# Patient Record
Sex: Female | Born: 1940
Health system: Southern US, Community
[De-identification: ages and names within clinical notes are randomized; demographics above are authoritative.]

## PROBLEM LIST (undated history)

## (undated) DIAGNOSIS — F419 Anxiety disorder, unspecified: Secondary | ICD-10-CM

## (undated) DIAGNOSIS — E039 Hypothyroidism, unspecified: Secondary | ICD-10-CM

## (undated) DIAGNOSIS — G43909 Migraine, unspecified, not intractable, without status migrainosus: Secondary | ICD-10-CM

## (undated) DIAGNOSIS — Z87442 Personal history of urinary calculi: Secondary | ICD-10-CM

## (undated) DIAGNOSIS — R112 Nausea with vomiting, unspecified: Secondary | ICD-10-CM

## (undated) DIAGNOSIS — M1712 Unilateral primary osteoarthritis, left knee: Secondary | ICD-10-CM

## (undated) DIAGNOSIS — R609 Edema, unspecified: Secondary | ICD-10-CM

## (undated) DIAGNOSIS — Z9889 Other specified postprocedural states: Secondary | ICD-10-CM

## (undated) DIAGNOSIS — E785 Hyperlipidemia, unspecified: Secondary | ICD-10-CM

## (undated) DIAGNOSIS — M8580 Other specified disorders of bone density and structure, unspecified site: Secondary | ICD-10-CM

## (undated) DIAGNOSIS — C801 Malignant (primary) neoplasm, unspecified: Secondary | ICD-10-CM

## (undated) DIAGNOSIS — D649 Anemia, unspecified: Secondary | ICD-10-CM

## (undated) DIAGNOSIS — I2699 Other pulmonary embolism without acute cor pulmonale: Secondary | ICD-10-CM

## (undated) DIAGNOSIS — I890 Lymphedema, not elsewhere classified: Secondary | ICD-10-CM

## (undated) DIAGNOSIS — C439 Malignant melanoma of skin, unspecified: Secondary | ICD-10-CM

## (undated) DIAGNOSIS — M1990 Unspecified osteoarthritis, unspecified site: Secondary | ICD-10-CM

## (undated) DIAGNOSIS — I10 Essential (primary) hypertension: Secondary | ICD-10-CM

## (undated) DIAGNOSIS — R011 Cardiac murmur, unspecified: Secondary | ICD-10-CM

## (undated) DIAGNOSIS — K760 Fatty (change of) liver, not elsewhere classified: Secondary | ICD-10-CM

## (undated) DIAGNOSIS — I82409 Acute embolism and thrombosis of unspecified deep veins of unspecified lower extremity: Secondary | ICD-10-CM

## (undated) HISTORY — DX: Essential (primary) hypertension: I10

## (undated) HISTORY — PX: APPENDECTOMY: SHX54

## (undated) HISTORY — DX: Migraine, unspecified, not intractable, without status migrainosus: G43.909

## (undated) HISTORY — DX: Hypothyroidism, unspecified: E03.9

## (undated) HISTORY — DX: Other specified disorders of bone density and structure, unspecified site: M85.80

## (undated) HISTORY — PX: UPPER GI ENDOSCOPY: SHX6162

## (undated) HISTORY — PX: COLONOSCOPY: SHX174

## (undated) HISTORY — DX: Malignant (primary) neoplasm, unspecified: C80.1

## (undated) HISTORY — PX: ABDOMINAL HYSTERECTOMY: SHX81

## (undated) HISTORY — DX: Unilateral primary osteoarthritis, left knee: M17.12

## (undated) HISTORY — PX: OVARIAN CYST SURGERY: SHX726

## (undated) HISTORY — DX: Edema, unspecified: R60.9

## (undated) HISTORY — PX: REDUCTION MAMMAPLASTY: SUR839

---

## 1998-02-26 ENCOUNTER — Emergency Department (HOSPITAL_COMMUNITY): Admission: EM | Admit: 1998-02-26 | Discharge: 1998-02-26 | Payer: Self-pay | Admitting: Emergency Medicine

## 1999-12-19 ENCOUNTER — Other Ambulatory Visit: Admission: RE | Admit: 1999-12-19 | Discharge: 1999-12-19 | Payer: Self-pay | Admitting: Internal Medicine

## 2001-03-01 ENCOUNTER — Other Ambulatory Visit: Admission: RE | Admit: 2001-03-01 | Discharge: 2001-03-01 | Payer: Self-pay | Admitting: Internal Medicine

## 2001-03-05 ENCOUNTER — Encounter: Payer: Self-pay | Admitting: Internal Medicine

## 2001-03-05 ENCOUNTER — Encounter: Admission: RE | Admit: 2001-03-05 | Discharge: 2001-03-05 | Payer: Self-pay | Admitting: Internal Medicine

## 2001-09-19 HISTORY — PX: KNEE ARTHROSCOPY: SUR90

## 2001-10-10 ENCOUNTER — Encounter: Payer: Self-pay | Admitting: Internal Medicine

## 2001-10-10 ENCOUNTER — Observation Stay (HOSPITAL_COMMUNITY): Admission: EM | Admit: 2001-10-10 | Discharge: 2001-10-10 | Payer: Self-pay | Admitting: Emergency Medicine

## 2001-10-10 ENCOUNTER — Encounter: Payer: Self-pay | Admitting: Emergency Medicine

## 2001-10-14 ENCOUNTER — Ambulatory Visit (HOSPITAL_COMMUNITY): Admission: RE | Admit: 2001-10-14 | Discharge: 2001-10-14 | Payer: Self-pay | Admitting: Orthopedic Surgery

## 2001-12-22 ENCOUNTER — Encounter: Payer: Self-pay | Admitting: Gastroenterology

## 2001-12-22 ENCOUNTER — Ambulatory Visit (HOSPITAL_COMMUNITY): Admission: RE | Admit: 2001-12-22 | Discharge: 2001-12-22 | Payer: Self-pay | Admitting: Gastroenterology

## 2002-01-07 ENCOUNTER — Ambulatory Visit (HOSPITAL_COMMUNITY): Admission: RE | Admit: 2002-01-07 | Discharge: 2002-01-07 | Payer: Self-pay | Admitting: Gastroenterology

## 2002-02-14 ENCOUNTER — Encounter: Payer: Self-pay | Admitting: Surgery

## 2002-02-14 ENCOUNTER — Encounter (INDEPENDENT_AMBULATORY_CARE_PROVIDER_SITE_OTHER): Payer: Self-pay

## 2002-02-14 ENCOUNTER — Observation Stay (HOSPITAL_COMMUNITY): Admission: RE | Admit: 2002-02-14 | Discharge: 2002-02-15 | Payer: Self-pay | Admitting: Surgery

## 2002-02-14 HISTORY — PX: CHOLECYSTECTOMY: SHX55

## 2002-03-03 ENCOUNTER — Other Ambulatory Visit: Admission: RE | Admit: 2002-03-03 | Discharge: 2002-03-03 | Payer: Self-pay | Admitting: Internal Medicine

## 2003-03-14 ENCOUNTER — Other Ambulatory Visit: Admission: RE | Admit: 2003-03-14 | Discharge: 2003-03-14 | Payer: Self-pay | Admitting: Internal Medicine

## 2003-03-21 ENCOUNTER — Encounter: Admission: RE | Admit: 2003-03-21 | Discharge: 2003-03-21 | Payer: Self-pay | Admitting: Internal Medicine

## 2003-04-22 HISTORY — PX: ANKLE FUSION: SHX881

## 2004-05-06 ENCOUNTER — Other Ambulatory Visit: Admission: RE | Admit: 2004-05-06 | Discharge: 2004-05-06 | Payer: Self-pay | Admitting: Internal Medicine

## 2004-05-23 ENCOUNTER — Encounter: Admission: RE | Admit: 2004-05-23 | Discharge: 2004-05-23 | Payer: Self-pay | Admitting: Internal Medicine

## 2006-04-21 HISTORY — PX: GANGLION CYST EXCISION: SHX1691

## 2007-04-22 HISTORY — PX: RECTOCELE REPAIR: SHX761

## 2007-08-11 ENCOUNTER — Ambulatory Visit: Payer: Self-pay

## 2008-04-21 DIAGNOSIS — I2699 Other pulmonary embolism without acute cor pulmonale: Secondary | ICD-10-CM

## 2008-04-21 HISTORY — PX: JOINT REPLACEMENT: SHX530

## 2008-04-21 HISTORY — DX: Other pulmonary embolism without acute cor pulmonale: I26.99

## 2008-05-02 ENCOUNTER — Ambulatory Visit: Payer: Self-pay | Admitting: Internal Medicine

## 2008-10-09 ENCOUNTER — Ambulatory Visit: Payer: Self-pay | Admitting: Internal Medicine

## 2008-10-24 ENCOUNTER — Ambulatory Visit: Payer: Self-pay | Admitting: Internal Medicine

## 2008-10-27 ENCOUNTER — Ambulatory Visit: Payer: Self-pay | Admitting: Internal Medicine

## 2008-11-20 ENCOUNTER — Ambulatory Visit: Payer: Self-pay | Admitting: Internal Medicine

## 2008-11-21 ENCOUNTER — Ambulatory Visit: Payer: Self-pay | Admitting: Internal Medicine

## 2008-11-22 ENCOUNTER — Ambulatory Visit: Payer: Self-pay | Admitting: Internal Medicine

## 2008-11-23 ENCOUNTER — Ambulatory Visit: Payer: Self-pay | Admitting: Internal Medicine

## 2008-11-24 ENCOUNTER — Ambulatory Visit: Payer: Self-pay | Admitting: Internal Medicine

## 2008-11-30 ENCOUNTER — Ambulatory Visit: Payer: Self-pay | Admitting: Internal Medicine

## 2008-12-04 ENCOUNTER — Ambulatory Visit: Payer: Self-pay | Admitting: Internal Medicine

## 2008-12-08 ENCOUNTER — Ambulatory Visit: Payer: Self-pay | Admitting: Internal Medicine

## 2008-12-14 ENCOUNTER — Ambulatory Visit: Payer: Self-pay | Admitting: Internal Medicine

## 2008-12-28 ENCOUNTER — Ambulatory Visit: Payer: Self-pay | Admitting: Internal Medicine

## 2009-01-25 ENCOUNTER — Ambulatory Visit: Payer: Self-pay | Admitting: Internal Medicine

## 2009-02-08 ENCOUNTER — Ambulatory Visit: Payer: Self-pay | Admitting: Internal Medicine

## 2009-03-12 ENCOUNTER — Ambulatory Visit: Payer: Self-pay | Admitting: Internal Medicine

## 2009-04-03 ENCOUNTER — Ambulatory Visit: Payer: Self-pay | Admitting: Internal Medicine

## 2009-05-08 ENCOUNTER — Ambulatory Visit: Payer: Self-pay | Admitting: Internal Medicine

## 2009-06-07 ENCOUNTER — Ambulatory Visit: Payer: Self-pay | Admitting: Internal Medicine

## 2010-01-15 ENCOUNTER — Ambulatory Visit: Payer: Self-pay | Admitting: Internal Medicine

## 2010-07-02 ENCOUNTER — Other Ambulatory Visit: Payer: Self-pay | Admitting: Internal Medicine

## 2010-07-04 ENCOUNTER — Encounter: Payer: Self-pay | Admitting: Internal Medicine

## 2010-07-12 ENCOUNTER — Encounter: Payer: Self-pay | Admitting: Internal Medicine

## 2010-07-22 ENCOUNTER — Encounter (INDEPENDENT_AMBULATORY_CARE_PROVIDER_SITE_OTHER): Payer: Medicare Other | Admitting: Internal Medicine

## 2010-07-22 DIAGNOSIS — E039 Hypothyroidism, unspecified: Secondary | ICD-10-CM

## 2010-07-22 DIAGNOSIS — I10 Essential (primary) hypertension: Secondary | ICD-10-CM

## 2010-07-22 DIAGNOSIS — R6889 Other general symptoms and signs: Secondary | ICD-10-CM

## 2010-08-19 ENCOUNTER — Ambulatory Visit: Payer: Medicare Other | Admitting: Internal Medicine

## 2010-08-27 ENCOUNTER — Ambulatory Visit (INDEPENDENT_AMBULATORY_CARE_PROVIDER_SITE_OTHER): Payer: Medicare Other | Admitting: Internal Medicine

## 2010-08-27 ENCOUNTER — Encounter: Payer: Self-pay | Admitting: Internal Medicine

## 2010-08-27 DIAGNOSIS — E039 Hypothyroidism, unspecified: Secondary | ICD-10-CM | POA: Insufficient documentation

## 2010-08-27 DIAGNOSIS — R739 Hyperglycemia, unspecified: Secondary | ICD-10-CM

## 2010-08-27 DIAGNOSIS — Z131 Encounter for screening for diabetes mellitus: Secondary | ICD-10-CM

## 2010-08-27 DIAGNOSIS — I10 Essential (primary) hypertension: Secondary | ICD-10-CM | POA: Insufficient documentation

## 2010-08-27 DIAGNOSIS — E669 Obesity, unspecified: Secondary | ICD-10-CM | POA: Insufficient documentation

## 2010-08-27 DIAGNOSIS — R6889 Other general symptoms and signs: Secondary | ICD-10-CM

## 2010-08-27 DIAGNOSIS — T880XXA Infection following immunization, initial encounter: Secondary | ICD-10-CM

## 2010-08-27 DIAGNOSIS — G43909 Migraine, unspecified, not intractable, without status migrainosus: Secondary | ICD-10-CM | POA: Insufficient documentation

## 2010-08-27 DIAGNOSIS — IMO0002 Reserved for concepts with insufficient information to code with codable children: Secondary | ICD-10-CM

## 2010-08-27 DIAGNOSIS — R899 Unspecified abnormal finding in specimens from other organs, systems and tissues: Secondary | ICD-10-CM

## 2010-08-27 DIAGNOSIS — R7309 Other abnormal glucose: Secondary | ICD-10-CM

## 2010-08-27 DIAGNOSIS — Z23 Encounter for immunization: Secondary | ICD-10-CM

## 2010-08-27 LAB — HEPATIC FUNCTION PANEL
Albumin: 4.5 g/dL (ref 3.5–5.2)
Total Protein: 7.2 g/dL (ref 6.0–8.3)

## 2010-08-27 NOTE — Progress Notes (Signed)
  Subjective:    Patient ID: Cassidy [name], female    DOB: July 27, 1940, 70 y.o.   MRN: [id number]  HPI 70 yo W female for followup of hypertension and elevated liver enzymes. Liver enzymes elevated at time of PE on July 22, 2010. No hx of recent viral illness. Is obese, does not consume alcohol. Generally feels well. Once in a while takes Vicodin for knee pain.    Review of Systems     Objective:   Physical Exam  Abdominal: Soft. Bowel sounds are normal. She exhibits no distension and no mass. There is no tenderness. There is no rebound and no guarding.   No hepatosplenomegaly.       Assessment & Plan:  Elevated liver functions- etiology unclear-?fatty liver; hypertension well controlled. Evaluate labs drawn today and advise pt of future plans and follow up. Tdap vaccine given today.

## 2010-08-28 LAB — HEMOGLOBIN A1C: Mean Plasma Glucose: 117 mg/dL — ABNORMAL HIGH (ref <117)

## 2010-09-06 NOTE — Op Note (Signed)
NAME:  Cassidy [name], Cassidy [name]                       ACCOUNT NO.:  [id number]   MEDICAL RECORD NO.:  [id number]                   PATIENT TYPE:  AMB   LOCATION:  ENDO                                 FACILITY:  [facility]   PHYSICIAN:  Charna Elizabeth, M.D.                   DATE OF BIRTH:  October 05, 1940   DATE OF PROCEDURE:  01/07/2002  DATE OF DISCHARGE:  01/07/2002                                 OPERATIVE REPORT   PROCEDURE:  Screening colonoscopy.   ENDOSCOPIST:  Charna Elizabeth, M.D.   INSTRUMENT USED:  Olympus video colonoscope.   INDICATION FOR PROCEDURE:  A 70 year old white female undergoing screening  colonoscopy.  Rule out colonic polyps, masses, hemorrhoids, etc.   PREPROCEDURE PREPARATION:  Informed consent was procured from the patient.  The patient was fasted for eight hours prior to the procedure and prepped  with a bottle of magnesium citrate and a gallon of NuLytely the night prior  to the procedure.   PREPROCEDURE PHYSICAL:  VITAL SIGNS:  The patient had stable vital signs.  NECK:  Supple.  CHEST:  Clear to auscultation.  S1, S2 regular.  ABDOMEN:  Soft with normal bowel sounds.   DESCRIPTION OF PROCEDURE:  The patient was placed in the left lateral  decubitus position and sedated with an additional 30 mg of Demerol and 2.5  mg of Versed intravenously.  Once the patient was adequately sedate and  maintained on low-flow oxygen and continuous cardiac monitoring, the Olympus  video colonoscope was advanced from the rectum to the cecum with slight  difficulty.  There was some residual in the colon.  The patient's position  was changed from the left lateral to the supine position.  The scope was  advanced to the cecum.  The appendiceal orifice and the ileocecal valve were  visualized and photographed.  No masses, polyps, erosions, ulcerations, or  diverticula were seen.  A small internal hemorrhoid was seen on retroflexion  in the rectum.  These were not bleeding at the time of  examination.   IMPRESSION:  1. Small, nonbleeding internal hemorrhoid.  2. Some residual stool in the colon.  No masses, polyps, or diverticulosis     noted.   RECOMMENDATIONS:  1. A high-fiber diet has been recommended for the patient.  2.     Repeat colorectal cancer screening is recommended in the next five years     unless the patient develops any abnormal symptoms in the interim.  3. Outpatient follow-up within the next seven to 10 days.                                               Charna Elizabeth, M.D.    JM/MEDQ  D:  01/07/2002  T:  01/10/2002  Job:  [id number]  cc:   Luanna Cole. Lenord Fellers, M.D.

## 2010-09-06 NOTE — H&P (Signed)
[facility]  Patient:    Cassidy [name], Cassidy [name] Visit Number: [id number] MRN: [id number]          Service Type: MED Location: 3W [phone] Attending Physician:  Sharlet Salina Dictated by:   Quita Skye Artis Flock, M.D. Admit Date:  10/09/2001 Discharge Date: 10/10/2001                           History and Physical  CHIEF COMPLAINT:  Nausea and vomiting.  HISTORY:  This 70 year old white female is a regular patient of Dr. Eden Emms Baxley and was in her usual state of good health yesterday until approximately 9:15 p.m. last night when she was at a high school reunion and had sudden onset of pain in her right scapular area with associated diaphoresis, nausea, vomiting, and diarrhea.  There was no blood seen in the vomiting or the diarrhea.  She had eaten hot dog and baked beans prior to the onset of symptoms, but her sisters who were also there had eaten the same thing and did not have any problems.  Prior in the day they had been to [facility] and had baked fish at another high school reunion event.  The patient vomited approximately 10 times thereafter and finally, in the early a.m., came to [facility] emergency room for evaluation.  The patient reports no history of GERD symptoms, has never been told of gallbladder trouble, did not have cramps or fever with the symptoms.  ALLERGIES:  None.  MEDICATIONS: 1. Synthroid 100 mcg a day. 2. Atenolol 25 mg a day. 3. Hydrochlorothiazide 25 mg a day.  SOCIAL HISTORY:  The patient is married.  She is a retired [employer] scientist. Does not smoke, does not drink.  FAMILY HISTORY:  The patient has two children - one son, one daughter - and three grandchildren.  She has two sisters, and one brother living - had an MI. One deceased brother died in an accident.  Both parents are deceased.  Father died at age 107 of an MI.  Mother died at age 70 of natural causes.  PAST MEDICAL HISTORY:  Status post hysterectomy,  status post appendectomy and associated ruptured ovarian cyst.  Chest pain bilateral breast reduction. Status post face lift.  REVIEW OF SYSTEMS:  The patient has hypertension for approximately a year-and-a-half.  She has mild osteoarthritis; she has ligament damage in her right knee at the current time and plans to have a repair on Thursday of this week by Dr. Fayrene Fearing [place].  The patient is menopausal since age 53.  PHYSICAL EXAMINATION:  GENERAL:  The patient is alert and cooperative in no acute distress.  Her abdominal symptoms are improved at the current time.  ADMISSION VITAL SIGNS:  Temperature 97.6, pulse 66 and regular, respirations 16, blood pressure 147/76, height 5 feet 8 inches, weight 215 pounds, O2 saturation on room air 94%.  HEENT:  Moist mucous membranes; otherwise normal.  NECK:  Supple.  Carotids 2+ bilaterally without bruits.  No jugular venous distention, no thyromegaly, no cervical adenopathy.  HEART:  Reveals regular rate and rhythm without murmurs or gallops.  Lungs are clear throughout to auscultation.  BREASTS:  Show bilateral reduction scars, no masses.  ABDOMEN:  Soft.  Bowel sounds are active.  There is mild to moderate upper abdominal tenderness to deep palpation without masses, guarding, or rebound. No CVA tenderness.  The patient has a suprapubic surgical scar.  PELVIC:  Not done  as not pertinent to present illness.  RECTAL:  Not done as not pertinent to present illness.  NEUROLOGIC:  Intact to gross motor and sensory function.  Pulses are 2+ and symmetric throughout.  There is no peripheral edema.  LABORATORY DATA:  Abdominal x-ray shows air-fluid levels with a nondistended colon and no signs of small bowel obstruction.  CBC shows white blood cell count 10.3, hemoglobin 14.7, hematocrit 43.1, lipase 30, amylase 69.  CMET is entirely within normal limits except for sodium 160.  CK-MB negative, troponin negative.  IMPRESSION: 1. Acute  gastroenteritis, probably food related.  Will rule out gallbladder    disease. 2. Hypernatremia, uncertain etiology.  Will repeat a BMET in the a.m. 3. Hypothyroidism. 4. Hypertension, controlled.  PLAN:  Will admit for IV fluids.  Phenergan for nausea/vomiting.  Will obtain a gallbladder ultrasound and repeat electrolytes.  Hopefully, early discharge. Dictated by:   Quita Skye Artis Flock, M.D. Attending Physician:  Sharlet Salina DD:  10/10/01 TD:  10/11/01 Job: [id number]

## 2010-09-06 NOTE — Op Note (Signed)
   NAME:  Cassidy [name], Cassidy [name]                       ACCOUNT NO.:  [id number]   MEDICAL RECORD NO.:  [id number]                   PATIENT TYPE:  AMB   LOCATION:  ENDO                                 FACILITY:  [facility]   PHYSICIAN:  Charna Elizabeth, M.D.                   DATE OF BIRTH:  26-Feb-1941   DATE OF PROCEDURE:  DATE OF DISCHARGE:  01/07/2002                                 OPERATIVE REPORT   PROCEDURE PERFORMED:  Esophagogastroduodenoscopy.   ENDOSCOPIST:  Charna Elizabeth, M.D.   INSTRUMENT USED:  Olympus video panendoscope.   INDICATIONS FOR PROCEDURE:  Epigastric pain with nausea in a 70 year old  white female with a normal abdominal ultrasound and HIDA scan.  Rule out  peptic ulcer disease, esophagitis, etc.   PREPROCEDURE PREPARATION:  Informed consent was procured from the patient.  The patient was fasted for eight hours prior to the procedure.   PREPROCEDURE PHYSICAL:  The patient had stable vital signs.  Neck supple,  chest clear to auscultation.  S1, S2 regular.  Abdomen soft with normal  bowel sounds.   DESCRIPTION OF PROCEDURE:  The patient was placed in the left lateral  decubitus position and sedated with 70 mg of Demerol and 7.5 mg of Versed  intravenously.  Once the patient was adequately sedated and maintained on  low-flow oxygen and continuous cardiac monitoring, the Olympus video  panendoscope was advanced through the mouth piece over the tongue into the  esophagus under direct vision.  The entire esophagus appeared normal with no  evidence of ring, stricture, masses, esophagitis or Barrett's mucosa.  The  scope was then advanced to the stomach.  The entire gastric mucosa to the  proximal small bowel appeared normal.   IMPRESSION:  Normal esophagogastroduodenoscopy.   RECOMMENDATIONS:  Proceed with colonoscopy at this time.                                                Charna Elizabeth, M.D.    JM/MEDQ  D:  01/07/2002  T:  01/10/2002  Job:  [id number]   cc:   Luanna Cole. Lenord Fellers, M.D.

## 2010-09-06 NOTE — Op Note (Signed)
[facility]  Patient:    Cassidy [name], Cassidy [name] Visit Number: [id number] MRN: [id number]          Service Type: DSU Location: DAY Attending Physician:  Marlowe Kays Page Dictated by:   Illene Labrador. Aplington, M.D. Proc. Date: 10/14/01 Admit Date:  10/14/2001                             Operative Report  PREOPERATIVE DIAGNOSES: 1. Torn medial meniscus. 2. Tricompartmental degenerative arthritis, right knee.  POSTOPERATIVE DIAGNOSES: 1. Torn medial and lateral menisci. 2. Tricompartmental arthritis, right knee.  OPERATION: 1. Right knee arthroscopy with one partial medial and lateral meniscectomies. 2. Debridement of medial and lateral femoral condyles.  SURGEON:  Illene Labrador. Aplington, M.D.  ASSISTANT:  Nurse.  ANESTHESIA:  General.  PATHOLOGY AND JUSTIFICATION OF PROCEDURE:  She has had many months history of pain and swelling in the right knee.  MRI has demonstrated what was felt to be an ACL deficient knee and a torn medial meniscus.  At surgery, her ACL appeared to be intact.  I took a picture of this.  She had disruption of the anterior third and most of the posterior third of the medial meniscus, as well as disruption of most of the lateral meniscus, including the anterior third. She had grade 2/4 chondromalacia of both the medial and lateral femoral condyles and grade 3/4 of both the patella and the trochlear notch.  DESCRIPTION OF PROCEDURE:  Satisfactory general anesthesia, pneumatic tourniquet, thigh stabilizer.  Right knee was prepped with DuraPrep and draped in a sterile field.  Superior and medial saline inflow.  First through an anterolateral portal, the medial compartment of knee joint was evaluated.  She had a large amount of synovitis present, and this was resected.  The meniscus itself looked to be reasonably intact.  She had a good bit of fraying of almost all of the weightbearing surface of the medial femoral condyle which  I shaved down until smooth with the 3.5 shaver.  Looking posteriorly, she had a tear involving the entire medial meniscus from just before the curve into the intercondylar area.  It appeared she had ground some of this meniscus down. She also had some incomplete but almost full-thickness wear of the posterior medial tibial plateau.  I trimmed the meniscus back to a stable rim and smoothed it down where possible.  Because of her anatomy, I was unable to get the 3.5 shaver completely into the back of the knee but could get it around the posterior curve.  Final pictures were taken.  I then looked up in the medial gutter and suprapatellar area and noted wear of the patella and the femoral notch, but this was not arthroscopically treatable.  The ACL was also pictured and noted to be normal.  I then reversed portals.  She had disruption of the anterior third of the lateral meniscus as well as serration and tears of the inner rim.  She also had a good bit of synovitis in the anterolateral portion of the joint.  With the 3.5 shaver, I resected the synovium and debrided up the anterior third of the lateral meniscus and then trimmed back the remaining meniscus with baskets, and I shaved it down with the 3.5 shaver, final pictures being taken.  Also smoothed down the lateral femoral condyle. Looking up in the lateral gutter and suprapatellar area, no additional findings were noted.  The knee joint was then  irrigated until clear and all fluid possible removed.  The two anterior portals were closed with 4-0 nylon; 20 cc of 0.5% Marcaine and 4 mg of morphine were then instilled through the inflow apparatus which was removed and this portal closed with 4-0 nylon as well.  Betadine, Adaptic dry sterile dressing were applied.  Tourniquet was released.  She tolerated the procedure well and was taken to the recovery room in satisfactory condition with no known complications. Dictated by:   Illene Labrador. Aplington,  M.D. Attending Physician:  Joaquin Courts DD:  10/14/01 TD:  10/16/01 Job: [id number]

## 2010-09-06 NOTE — Op Note (Signed)
NAME:  Cassidy [name], Cassidy [name]                       ACCOUNT NO.:  [id number]   MEDICAL RECORD NO.:  [id number]                   PATIENT TYPE:  AMB   LOCATION:  DAY                                  [facility]ACILITY[facility]  [facility]   PHYSICIAN:  Abigail Miyamoto, MD                DATE OF BIRTH:  11-25-1940   DATE OF PROCEDURE:  02/14/2002  DATE OF DISCHARGE:                                 OPERATIVE REPORT   PREOPERATIVE DIAGNOSIS:  Biliary diskinesia.   POSTOPERATIVE DIAGNOSIS:  Biliary diskinesia.   PROCEDURE:  Laparoscopic cholecystectomy with intraoperative cholangiogram.   SURGEON:  Abigail Miyamoto, MD   ASSISTANT:  Donnie Coffin. Samuella Cota, M.D.   ANESTHESIA:  General endotracheal.   ESTIMATED BLOOD LOSS:  Minimal.   FINDINGS:  The patient was found to have a normal cholangiogram.   DESCRIPTION OF PROCEDURE:  The patient was brought to the operating room,  identified as Cassidy [name]. She was placed supine on the operating room  table and general anesthesia was induced. Her abdomen was then prepped and  draped in the usual sterile fashion. Using a #15 blade, a small transverse  incision was made below the umbilicus. Dissection was carried down to the  fascia which was then opened with a scalpel. A hemostat was then used to  pass into the peritoneal cavity. A #0 Vicryl pursestring suture was then  placed around the fascial opening. The Hasson port was placed through the  opening and insufflation of the abdomen was begun. Next, a 12 mm port was  placed in the patient's epigastrium and two 5 mm ports were placed in the  patient's right flank under direct vision. The gallbladder was then  identified and retracted above the liver bed. It was found to be chronically  scarred with findings consistent with cholecystitis. The cystic duct was  then dissected out and clipped once distally. It was then partly transected  with the laparoscopic scissors. The cholangiocath was then inserted in the  right  upper quadrant under direct vision through an angiocath. The  cholangiocath was then placed into the cystic duct. Cholangiogram was then  performed with contrast under direct fluoroscopy. Good flow of contrast was  seen into the common bile duct, proximal biliary radicles and into the  duodenum without evidence of abnormality. The cholangiocath was then  removed. The cystic duct was then clipped twice proximally and transected  with the scissors. The cystic artery was then identified and clipped twice  proximally, once distally and transected as well. The gallbladder was then  dissected free from the liver bed with the electrocautery. Hemostasis was  then achieved in the liver bed with the cautery. Once the gallbladder was  free from the liver bed, it was removed through the incision at the  umbilicus. The #0 Vicryl at the umbilicus was tied in place closing the  fascial defect. The abdomen was then copiously irrigated with normal saline.  Hemostasis again appeared to be achieved. All ports were then removed under  direct vision and the abdomen was deflated. All incisions were then  anesthetized with 0.25% Marcaine and then closed with 4-0  Monocryl subcuticular sutures. Steri-Strips, gauze and tape were then  applied. The patient tolerated the procedure well. All sponge, needle and  instrument counts were correct at the end of the procedure. The patient was  then extubated in the operating room and taken in stable condition to the  recovery room.                                               Abigail Miyamoto, MD    DB/MEDQ  D:  02/14/2002  T:  02/14/2002  Job:  [id number]

## 2010-09-17 ENCOUNTER — Other Ambulatory Visit: Payer: Self-pay | Admitting: Internal Medicine

## 2010-09-17 DIAGNOSIS — I10 Essential (primary) hypertension: Secondary | ICD-10-CM

## 2010-10-01 ENCOUNTER — Other Ambulatory Visit (INDEPENDENT_AMBULATORY_CARE_PROVIDER_SITE_OTHER): Payer: BLUE CROSS/BLUE SHIELD | Admitting: Internal Medicine

## 2010-10-01 DIAGNOSIS — E039 Hypothyroidism, unspecified: Secondary | ICD-10-CM

## 2011-02-17 ENCOUNTER — Other Ambulatory Visit: Payer: Self-pay | Admitting: Internal Medicine

## 2011-02-17 NOTE — Telephone Encounter (Signed)
Pull chart and see if due for OV

## 2011-03-22 ENCOUNTER — Other Ambulatory Visit: Payer: Self-pay | Admitting: Internal Medicine

## 2011-03-22 NOTE — Telephone Encounter (Signed)
Refill for 30 days. See when due for OV vs CPE.

## 2011-06-20 ENCOUNTER — Other Ambulatory Visit: Payer: Self-pay | Admitting: Internal Medicine

## 2011-06-20 NOTE — Telephone Encounter (Signed)
Please refill and see when pt due for OV

## 2011-07-28 ENCOUNTER — Other Ambulatory Visit: Payer: Self-pay | Admitting: Internal Medicine

## 2011-09-02 ENCOUNTER — Other Ambulatory Visit: Payer: Self-pay | Admitting: Internal Medicine

## 2011-09-02 NOTE — Telephone Encounter (Signed)
Please see when pt needs ov and TSH before refilling

## 2011-09-03 ENCOUNTER — Other Ambulatory Visit: Payer: Self-pay

## 2011-09-03 MED ORDER — SYNTHROID 100 MCG PO TABS: 100.0000 ug | ORAL_TABLET | Freq: Every day | ORAL | Status: DC

## 2011-10-08 ENCOUNTER — Other Ambulatory Visit: Payer: Self-pay | Admitting: Internal Medicine

## 2011-10-30 ENCOUNTER — Other Ambulatory Visit: Payer: Self-pay | Admitting: Internal Medicine

## 2011-10-30 ENCOUNTER — Other Ambulatory Visit: Payer: Medicare Other | Admitting: Internal Medicine

## 2011-10-30 DIAGNOSIS — M899 Disorder of bone, unspecified: Secondary | ICD-10-CM | POA: Diagnosis not present

## 2011-10-30 DIAGNOSIS — E039 Hypothyroidism, unspecified: Secondary | ICD-10-CM | POA: Diagnosis not present

## 2011-10-30 DIAGNOSIS — R7301 Impaired fasting glucose: Secondary | ICD-10-CM | POA: Diagnosis not present

## 2011-10-30 DIAGNOSIS — I10 Essential (primary) hypertension: Secondary | ICD-10-CM

## 2011-10-30 DIAGNOSIS — M8580 Other specified disorders of bone density and structure, unspecified site: Secondary | ICD-10-CM

## 2011-10-30 DIAGNOSIS — M949 Disorder of cartilage, unspecified: Secondary | ICD-10-CM | POA: Diagnosis not present

## 2011-10-30 LAB — CBC WITH DIFFERENTIAL/PLATELET
Basophils Absolute: 0.1 10*3/uL (ref 0.0–0.1)
Eosinophils Relative: 4 % (ref 0–5)
Lymphocytes Relative: 41 % (ref 12–46)
Lymphs Abs: 2.3 10*3/uL (ref 0.7–4.0)
MCV: 88.6 fL (ref 78.0–100.0)
Monocytes Absolute: 0.5 10*3/uL (ref 0.1–1.0)
Monocytes Relative: 8 % (ref 3–12)
Neutro Abs: 2.5 10*3/uL (ref 1.7–7.7)
Neutrophils Relative %: 46 % (ref 43–77)
RBC: 4.64 MIL/uL (ref 3.87–5.11)

## 2011-10-30 LAB — COMPREHENSIVE METABOLIC PANEL
ALT: 23 U/L (ref 0–35)
Albumin: 4.3 g/dL (ref 3.5–5.2)
Calcium: 9.3 mg/dL (ref 8.4–10.5)
Total Protein: 7 g/dL (ref 6.0–8.3)

## 2011-10-30 LAB — LIPID PANEL
Cholesterol: 177 mg/dL (ref 0–200)
HDL: 39 mg/dL — ABNORMAL LOW (ref >39)
Total CHOL/HDL Ratio: 4.5 Ratio

## 2011-10-31 ENCOUNTER — Ambulatory Visit (INDEPENDENT_AMBULATORY_CARE_PROVIDER_SITE_OTHER): Payer: Medicare Other | Admitting: Internal Medicine

## 2011-10-31 ENCOUNTER — Encounter: Payer: Self-pay | Admitting: Internal Medicine

## 2011-10-31 ENCOUNTER — Ambulatory Visit
Admission: RE | Admit: 2011-10-31 | Discharge: 2011-10-31 | Disposition: A | Discharge status: Home / Self Care | Payer: Medicare Other | Source: Ambulatory Visit | Attending: Internal Medicine | Admitting: Internal Medicine

## 2011-10-31 VITALS — BP 128/72 | HR 80 | Temp 98.4°F | Ht 64.5 in | Wt 236.0 lb

## 2011-10-31 DIAGNOSIS — M7989 Other specified soft tissue disorders: Secondary | ICD-10-CM | POA: Diagnosis not present

## 2011-10-31 DIAGNOSIS — Z0000 Encounter for general adult medical examination without abnormal findings: Secondary | ICD-10-CM | POA: Diagnosis not present

## 2011-10-31 DIAGNOSIS — Z86711 Personal history of pulmonary embolism: Secondary | ICD-10-CM

## 2011-10-31 DIAGNOSIS — L97509 Non-pressure chronic ulcer of other part of unspecified foot with unspecified severity: Secondary | ICD-10-CM

## 2011-10-31 DIAGNOSIS — Z87898 Personal history of other specified conditions: Secondary | ICD-10-CM

## 2011-10-31 DIAGNOSIS — Z96651 Presence of right artificial knee joint: Secondary | ICD-10-CM

## 2011-10-31 DIAGNOSIS — B999 Unspecified infectious disease: Secondary | ICD-10-CM

## 2011-10-31 DIAGNOSIS — M869 Osteomyelitis, unspecified: Secondary | ICD-10-CM

## 2011-10-31 DIAGNOSIS — I10 Essential (primary) hypertension: Secondary | ICD-10-CM

## 2011-10-31 LAB — POCT URINALYSIS DIPSTICK
Protein, UA: NEGATIVE
Urobilinogen, UA: NEGATIVE

## 2011-10-31 LAB — VITAMIN D 25 HYDROXY (VIT D DEFICIENCY, FRACTURES): Vit D, 25-Hydroxy: 33 ng/mL (ref 30–89)

## 2011-10-31 LAB — HEMOGLOBIN A1C: Hgb A1c MFr Bld: 5.5 % (ref <5.7)

## 2011-10-31 NOTE — Patient Instructions (Addendum)
Xray left second toe to rule out osteomyelitis. Orthopedic  appt arranged.  Continue weight watchers. Continue same meds. See me in 6 months.

## 2011-11-02 ENCOUNTER — Encounter: Payer: Self-pay | Admitting: Internal Medicine

## 2011-11-02 DIAGNOSIS — Z96651 Presence of right artificial knee joint: Secondary | ICD-10-CM | POA: Insufficient documentation

## 2011-11-02 DIAGNOSIS — Z87898 Personal history of other specified conditions: Secondary | ICD-10-CM | POA: Insufficient documentation

## 2011-11-02 DIAGNOSIS — Z86711 Personal history of pulmonary embolism: Secondary | ICD-10-CM | POA: Insufficient documentation

## 2011-11-02 NOTE — Progress Notes (Signed)
Subjective:    Patient ID: Cassidy [name], female    DOB: February 14, 1941, 71 y.o.   MRN: [id number]  HPI pleasant 71 year old white female with history of migraine headaches, hypothyroidism, hypertension, obesity in today for evaluation of medical problems and health maintenance. She also has a history of osteopenia and dependent edema. Patient says she had colonoscopy done in 2007 at [facility]. Previously had one here done by Dr. Charna Elizabeth in September 2003. She is to check with [facility] to seek when she is due to have repeat colonoscopy because I do not have that report. Last bone density study on file 2006. Hasn't had a recent mammogram. Had tetanus immunization 08/27/2010, Pneumovax immunization 2010.  No known drug allergies  History of bilateral pulmonary emboli after knee replacement surgery July 2010. Was hospitalized at no one for site hospital 11/14/2008 through 11/18/2008. Knee replacement surgery had been done July 13.  History of GYN care through [facility]. Had GYN surgery and rectocele repair in 2009.  Arthroscopic surgery for torn ligaments in right knee June 2003, cholecystectomy 2003, right wrist ganglion cyst 2008, left ankle surgery 2005 involving a bone graft.  Patient does not smoke. Social alcohol consumption. 2 adult children. Daughter with history of breast cancer doing well. 3 grandchildren.  Family history: Father died at age 55 of a sudden MI. Mother died at age 42 of heart problems. One brother killed in an airplane crash at age 79. 2 sisters in good health.  Review of systems remarkable for recurrent urinary tract infections and improved status post GYN surgery in 2009. Patient did have a Cardiolite study done at [facility] and in April 2009 which was normal.  Subjective:   Patient presents for Medicare Annual/Subsequent preventive examination.   Review Past Medical/Family/Social: See  above   Risk Factors  Current exercise habits: Does not exercise routinely- has been going to [employer] Dietary issues discussed:   Cardiac risk factors: Obesity (BMI >= 30 kg/m2).   Depression Screen  (Note: if answer to either of the following is "Yes", a more complete depression screening is indicated)  Over the past two weeks, have you felt down, depressed or hopeless? No Over the past two weeks, have you felt little interest or pleasure in doing things? No Have you lost interest or pleasure in daily life? No Do you often feel hopeless? No Do you cry easily over simple problems? No   Activities of Daily Living  In your present state of health, do you have any difficulty performing the following activities?:  Driving? No  Managing money? No  Feeding yourself? No  Getting from bed to chair? No  Climbing a flight of stairs? No  Preparing food and eating?: No  Bathing or showering? No  Getting dressed: No  Getting to the toilet? No  Using the toilet:No  Moving around from place to place: No  In the past year have you fallen or had a near fall?:No  Are you sexually active? No  Do you have more than one partner? No   Hearing Difficulties: No  Do you often ask people to speak up or repeat themselves? No  Do you experience ringing or noises in your ears? No Do you have difficulty understanding soft or whispered voices? No  Do you feel that you have a problem with memory? No Do you often misplace items? No  Do you feel safe at home? Yes  Cognitive Testing  Alert? Yes Normal Appearance?Yes  Oriented to person? Yes Place? Yes  Time? Yes  Recall of three objects? Yes  Can perform simple calculations? Yes  Displays appropriate judgment?Yes  Can read the correct time from a watch face?Yes   List the Names of Other Physician/Practitioners you currently use:    Indicate any recent Medical Services you may have received from other than [facility] providers in the past year  (date may be approximate).   Screening Tests / Date Colonoscopy    2007 [facility]                 Zostavax prescription provided today Mammogram 2006 Influenza Vaccine  Tetanus/tdap 2012    Objective:   Vision by Snellen chart: 20/30 a.u. Body mass index:  BP     Pulse     Ht       Wt       BMI      SpO2 see vital signs previously recorded    General appearance: Appears stated age and mildly obese  Head: Normocephalic, without obvious abnormality, atraumatic  Eyes: conj clear, EOMi PEERLA  Ears: normal TM's and external ear canals both ears  Nose: Nares normal. Septum midline. Mucosa normal. No drainage or sinus tenderness.  Throat: lips, mucosa, and tongue normal; teeth and gums normal  Neck: no adenopathy, no carotid bruit, no JVD, supple, symmetrical, trachea midline and thyroid not enlarged, symmetric, no tenderness/mass/nodules  No CVA tenderness.  Lungs: clear to auscultation bilaterally  Breasts: normal appearance, no masses or tenderness,  Heart: regular rate and rhythm, S1, S2 normal, no murmur, click, rub or gallop  Abdomen: soft, non-tender; bowel sounds normal; no masses, no organomegaly  Musculoskeletal: ROM normal in all joints, no crepitus, no deformity, Normal muscle strengthen. See mass left second toe described below  described in physical exam below Skin: Skin color, texture, turgor normal. No rashes or lesions  Lymph nodes: Cervical, supraclavicular, and axillary nodes normal.  Neurologic: CN 2 -12 Normal, Normal symmetric reflexes. Normal coordination and gait  Psych: Alert & Oriented x 3, Mood appear stable.    Assessment:    Annual wellness medicare exam   Plan:    During the course of the visit the patient was educated and counseled about appropriate screening and preventive services including:  Screening mammography  Colorectal cancer screening  Shingles vaccine. Prescription given to that she can get the vaccine at  the pharmacy or Medicare part D.  Screen + for depression. PHQ- 9 score of 0  Diet review for nutrition referral? Yes ____ Not Indicated __x__going to [employer] Patient Instructions (the written plan) was given to the patient.  Medicare Attestation  I have personally reviewed:  The patient's medical and social history  Their use of alcohol, tobacco or illicit drugs  Their current medications and supplements  The patient's functional ability including ADLs,fall risks, home safety risks, cognitive, and hearing and visual impairment  Diet and physical activities  Evidence for depression or mood disorders  The patient's weight, height, BMI, and visual acuity have been recorded in the chart. I have made referrals, counseling, and provided education to the patient based on review of the above and I have provided the patient with a written personalized care plan for preventive services.       Review of Systems  Constitutional: Negative.   HENT: Negative.   Eyes: Negative.   Respiratory: Negative.   Cardiovascular: Positive for leg swelling.  Gastrointestinal: Negative.   Genitourinary: Negative.   Musculoskeletal:       Growth on left second toe  Neurological:       History of migraine headache  Hematological: Negative.   Psychiatric/Behavioral: Negative.        Objective:   Physical Exam  Nursing note and vitals reviewed. Constitutional: She is oriented to person, place, and time. She appears well-developed and well-nourished. No distress.  HENT:  Head: Normocephalic and atraumatic.  Right Ear: External ear normal.  Left Ear: External ear normal.  Mouth/Throat: Oropharynx is clear and moist.  Eyes: Conjunctivae and EOM are normal. Pupils are equal, round, and reactive to light. Right eye exhibits no discharge. Left eye exhibits no discharge. No scleral icterus.  Neck: Neck supple. No JVD present. No thyromegaly present.  Cardiovascular: Normal rate, regular rhythm,  normal heart sounds and intact distal pulses.   No murmur heard. Pulmonary/Chest: Effort normal and breath sounds normal. No respiratory distress. She has no wheezes. She has no rales.       Breasts normal female  Abdominal: Soft. Bowel sounds are normal. She exhibits no distension and no mass. There is no rebound and no guarding.  Genitourinary:       Deferred  Musculoskeletal:       Trace lower extremity edema. Decreased range of motion knees  Has verrucous mass growing from distal left second toe with spongy area laterally draining serosanguineous fluid.  Lymphadenopathy:    She has no cervical adenopathy.  Neurological: She is alert and oriented to person, place, and time. She has normal reflexes. She displays normal reflexes. No cranial nerve deficit. Coordination normal.  Skin: Skin is warm and dry. No rash noted. She is not diaphoretic.       Tinea pedis both feet  Psychiatric: She has a normal mood and affect. Her behavior is normal. Judgment and thought content normal.          Assessment & Plan:   Obesity  Hypothyroidism  Tinea pedis  Hypertension  History of migraine headache  Verrucous mass distal left second toe with serosanguineous drainage-concern for osteomyelitis  History of mild glucose  History of bilateral pulmonary emboli status post knee replacement surgery

## 2011-11-04 DIAGNOSIS — M259 Joint disorder, unspecified: Secondary | ICD-10-CM | POA: Diagnosis not present

## 2011-11-05 ENCOUNTER — Other Ambulatory Visit: Payer: Self-pay | Admitting: Orthopedic Surgery

## 2011-11-06 ENCOUNTER — Encounter (HOSPITAL_BASED_OUTPATIENT_CLINIC_OR_DEPARTMENT_OTHER): Payer: Self-pay | Admitting: *Deleted

## 2011-11-06 ENCOUNTER — Encounter (HOSPITAL_BASED_OUTPATIENT_CLINIC_OR_DEPARTMENT_OTHER)
Admission: RE | Admit: 2011-11-06 | Discharge: 2011-11-06 | Disposition: A | Discharge status: Home / Self Care | Payer: Medicare Other | Source: Ambulatory Visit | Attending: Orthopedic Surgery | Admitting: Orthopedic Surgery

## 2011-11-06 DIAGNOSIS — Z01812 Encounter for preprocedural laboratory examination: Secondary | ICD-10-CM | POA: Diagnosis not present

## 2011-11-06 DIAGNOSIS — I10 Essential (primary) hypertension: Secondary | ICD-10-CM | POA: Diagnosis not present

## 2011-11-06 DIAGNOSIS — B070 Plantar wart: Secondary | ICD-10-CM | POA: Diagnosis not present

## 2011-11-06 DIAGNOSIS — Z01810 Encounter for preprocedural cardiovascular examination: Secondary | ICD-10-CM | POA: Diagnosis not present

## 2011-11-06 DIAGNOSIS — E039 Hypothyroidism, unspecified: Secondary | ICD-10-CM | POA: Diagnosis not present

## 2011-11-06 LAB — BASIC METABOLIC PANEL
CO2: 26 mEq/L (ref 19–32)
Calcium: 9.5 mg/dL (ref 8.4–10.5)
Chloride: 102 mEq/L (ref 96–112)
Creatinine, Ser: 0.7 mg/dL (ref 0.50–1.10)
GFR calc Af Amer: >90 mL/min (ref >90)
Sodium: 141 mEq/L (ref 135–145)

## 2011-11-06 NOTE — H&P (Signed)
HPI: Patient presents with a chief complaint of a large verrucous mass, distal aspect of left second toe for 4 years.  Patient is seen in consultation from Dr. Sharlet Salina, who should receive a copy of this note.  The mass on the left second toe is been present for 4 years with no antecedent trauma.  Occasionally there is some mild.  One drainage but no fevers or chills.  She's never had any redness or erythema going above the distal IP joint of the left second toe.  When she was evaluated by Dr. Lenord Fellers there was a concern for possible osteomyelitis.  The mass has very mild pain, some associated numbness, really hasn't changed much in the last couple of years.  It does not wake her from sleep.  Activity makes the swelling a little bit worse elevation makes it better.  All: None  ROS: 14 point review of systems form filled out by the patient was reviewed and was negative as it relates to the history of present illness except for:.  She's not a diabetic no elevated cholesterol  PMH: After a total knee in 2010.  She had blood clots.  Before the knee replacement.  She was taking baby aspirin she had 2 weeks of Lovenox after the surgery and then when she transition back to the baby aspirin that's when she had the blood clots what sounds like a possible PE.  FHx: High blood pressure, arthritis  SocHx:.  She does not smoke.  She does not drink.  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  PE: Well-nourished well-developed patient seated on the exam table in no apparent distress, no shortness of breath.  The left second toe has a verruca some mass that envelops pre-much entire distal phalangeal region.  Most of the nail is been absorbed by the mass, which has a black and blue component and no drainage today.  Imaging/Tests: AP and lateral x-rays show dissolution of over 50% of the distal phalanx consistent with either osteomyelitis or possibly the mass itself is  lysing the bone.  Assess: Osteomyelitis versus squamous cell carcinoma versus severe chronic fungal infection.  Plan: Wide excisional biopsy which she'll take it back to the middle of the middle phalanx will laminator problem if it's an infection or a carcinoma.  This was discussed at length with the patient and we'll proceed at her convenience.  We will also make sure that we get good pathology to make sure the margins are clean.  In case this does represent a mitotic problem.

## 2011-11-06 NOTE — Progress Notes (Signed)
To come in for bmet-ekg Hx pe after total knee-2010

## 2011-11-07 NOTE — Progress Notes (Signed)
ekg reviewed by dr Gelene Mink cleared for surg

## 2011-11-10 ENCOUNTER — Encounter (HOSPITAL_BASED_OUTPATIENT_CLINIC_OR_DEPARTMENT_OTHER): Payer: Self-pay | Admitting: Anesthesiology

## 2011-11-10 ENCOUNTER — Encounter (HOSPITAL_BASED_OUTPATIENT_CLINIC_OR_DEPARTMENT_OTHER): Payer: Self-pay

## 2011-11-10 ENCOUNTER — Ambulatory Visit (HOSPITAL_BASED_OUTPATIENT_CLINIC_OR_DEPARTMENT_OTHER): Payer: Medicare Other | Admitting: Anesthesiology

## 2011-11-10 ENCOUNTER — Other Ambulatory Visit: Payer: Self-pay

## 2011-11-10 ENCOUNTER — Encounter (HOSPITAL_BASED_OUTPATIENT_CLINIC_OR_DEPARTMENT_OTHER)
Admission: RE | Disposition: A | Discharge status: Home / Self Care | Payer: Self-pay | Source: Ambulatory Visit | Attending: Orthopedic Surgery

## 2011-11-10 ENCOUNTER — Ambulatory Visit (HOSPITAL_BASED_OUTPATIENT_CLINIC_OR_DEPARTMENT_OTHER)
Admission: RE | Admit: 2011-11-10 | Discharge: 2011-11-10 | Disposition: A | Discharge status: Home / Self Care | Payer: Medicare Other | Source: Ambulatory Visit | Attending: Orthopedic Surgery | Admitting: Orthopedic Surgery

## 2011-11-10 DIAGNOSIS — Z01812 Encounter for preprocedural laboratory examination: Secondary | ICD-10-CM | POA: Diagnosis not present

## 2011-11-10 DIAGNOSIS — M86179 Other acute osteomyelitis, unspecified ankle and foot: Secondary | ICD-10-CM | POA: Diagnosis not present

## 2011-11-10 DIAGNOSIS — M2060 Acquired deformities of toe(s), unspecified, unspecified foot: Secondary | ICD-10-CM

## 2011-11-10 DIAGNOSIS — E039 Hypothyroidism, unspecified: Secondary | ICD-10-CM | POA: Insufficient documentation

## 2011-11-10 DIAGNOSIS — B070 Plantar wart: Secondary | ICD-10-CM | POA: Insufficient documentation

## 2011-11-10 DIAGNOSIS — I10 Essential (primary) hypertension: Secondary | ICD-10-CM | POA: Insufficient documentation

## 2011-11-10 DIAGNOSIS — Z01810 Encounter for preprocedural cardiovascular examination: Secondary | ICD-10-CM | POA: Insufficient documentation

## 2011-11-10 DIAGNOSIS — D492 Neoplasm of unspecified behavior of bone, soft tissue, and skin: Secondary | ICD-10-CM | POA: Diagnosis not present

## 2011-11-10 DIAGNOSIS — L97509 Non-pressure chronic ulcer of other part of unspecified foot with unspecified severity: Secondary | ICD-10-CM | POA: Diagnosis not present

## 2011-11-10 HISTORY — PX: AMPUTATION: SHX166

## 2011-11-10 HISTORY — DX: Other specified postprocedural states: R11.2

## 2011-11-10 HISTORY — DX: Nausea with vomiting, unspecified: Z98.890

## 2011-11-10 HISTORY — DX: Unspecified osteoarthritis, unspecified site: M19.90

## 2011-11-10 HISTORY — DX: Other pulmonary embolism without acute cor pulmonale: I26.99

## 2011-11-10 SURGERY — AMPUTATION, FOOT, RAY
Anesthesia: Monitor Anesthesia Care | Site: Toe | Laterality: Left | Wound class: Clean Contaminated

## 2011-11-10 MED ORDER — LIDOCAINE HCL 1 % IJ SOLN
INTRAMUSCULAR | Status: DC | PRN
  Administered 2011-11-10: 2 mL via INTRADERMAL

## 2011-11-10 MED ORDER — METOCLOPRAMIDE HCL 5 MG/ML IJ SOLN: 10.0000 mg | Freq: Once | INTRAMUSCULAR | Status: DC | PRN

## 2011-11-10 MED ORDER — CEFAZOLIN SODIUM-DEXTROSE 2-3 GM-% IV SOLR: 2.0000 g | INTRAVENOUS | Status: DC

## 2011-11-10 MED ORDER — HYDROCODONE-ACETAMINOPHEN 5-325 MG PO TABS: 1.0000 | ORAL_TABLET | ORAL | Status: AC | PRN

## 2011-11-10 MED ORDER — OXYCODONE HCL 5 MG/5ML PO SOLN: 5.0000 mg | Freq: Once | ORAL | Status: DC | PRN

## 2011-11-10 MED ORDER — DEXTROSE-NACL 5-0.45 % IV SOLN: INTRAVENOUS | Status: DC

## 2011-11-10 MED ORDER — SYNTHROID 100 MCG PO TABS: 100.0000 ug | ORAL_TABLET | Freq: Every day | ORAL | Status: DC

## 2011-11-10 MED ORDER — ONDANSETRON HCL 4 MG/2ML IJ SOLN
INTRAMUSCULAR | Status: DC | PRN
  Administered 2011-11-10: 4 mg via INTRAVENOUS

## 2011-11-10 MED ORDER — MIDAZOLAM HCL 2 MG/2ML IJ SOLN
1.0000 mg | INTRAMUSCULAR | Status: DC | PRN
  Administered 2011-11-10: 2 mg via INTRAVENOUS

## 2011-11-10 MED ORDER — CEFAZOLIN SODIUM 1-5 GM-% IV SOLN
INTRAVENOUS | Status: DC | PRN
  Administered 2011-11-10: 2 g via INTRAVENOUS

## 2011-11-10 MED ORDER — FENTANYL CITRATE 0.05 MG/ML IJ SOLN
INTRAMUSCULAR | Status: DC | PRN
  Administered 2011-11-10 (×2): 50 ug via INTRAVENOUS

## 2011-11-10 MED ORDER — OXYCODONE HCL 5 MG PO TABS: 5.0000 mg | ORAL_TABLET | Freq: Once | ORAL | Status: DC | PRN

## 2011-11-10 MED ORDER — AMLODIPINE BESYLATE 5 MG PO TABS: 5.0000 mg | ORAL_TABLET | Freq: Every day | ORAL | Status: DC

## 2011-11-10 MED ORDER — ROPIVACAINE HCL 5 MG/ML IJ SOLN
INTRAMUSCULAR | Status: DC | PRN
  Administered 2011-11-10: 25 mL via EPIDURAL

## 2011-11-10 MED ORDER — CHLORHEXIDINE GLUCONATE 4 % EX LIQD: 60.0000 mL | Freq: Once | CUTANEOUS | Status: DC

## 2011-11-10 MED ORDER — FENTANYL CITRATE 0.05 MG/ML IJ SOLN
50.0000 ug | INTRAMUSCULAR | Status: DC | PRN
  Administered 2011-11-10: 100 ug via INTRAVENOUS

## 2011-11-10 MED ORDER — MIDAZOLAM HCL 5 MG/5ML IJ SOLN
INTRAMUSCULAR | Status: DC | PRN
  Administered 2011-11-10: 1 mg via INTRAVENOUS

## 2011-11-10 MED ORDER — LACTATED RINGERS IV SOLN
INTRAVENOUS | Status: DC | PRN
  Administered 2011-11-10: 10:00:00 via INTRAVENOUS

## 2011-11-10 MED ORDER — LIDOCAINE HCL (CARDIAC) 20 MG/ML IV SOLN
INTRAVENOUS | Status: DC | PRN
  Administered 2011-11-10: 50 mg via INTRAVENOUS

## 2011-11-10 MED ORDER — FUROSEMIDE 40 MG PO TABS: 40.0000 mg | ORAL_TABLET | Freq: Every day | ORAL | Status: DC

## 2011-11-10 MED ORDER — PROPOFOL 10 MG/ML IV EMUL
INTRAVENOUS | Status: DC | PRN
  Administered 2011-11-10: 100 ug/kg/min via INTRAVENOUS

## 2011-11-10 MED ORDER — HYDROMORPHONE HCL PF 1 MG/ML IJ SOLN: 0.2500 mg | INTRAMUSCULAR | Status: DC | PRN

## 2011-11-10 MED ORDER — LIDOCAINE-EPINEPHRINE (PF) 1.5 %-1:200000 IJ SOLN
INTRAMUSCULAR | Status: DC | PRN
  Administered 2011-11-10: 25 mL

## 2011-11-10 SURGICAL SUPPLY — 61 items
BANDAGE CONFORM 2  STR LF (GAUZE/BANDAGES/DRESSINGS) IMPLANT
BANDAGE ELASTIC 3 VELCRO ST LF (GAUZE/BANDAGES/DRESSINGS) IMPLANT
BANDAGE ELASTIC 4 VELCRO ST LF (GAUZE/BANDAGES/DRESSINGS) ×2 IMPLANT
BANDAGE ESMARK 6X9 LF (GAUZE/BANDAGES/DRESSINGS) IMPLANT
BLADE OSC/SAG .038X5.5 CUT EDG (BLADE) IMPLANT
BLADE SURG 10 STRL SS (BLADE) ×2 IMPLANT
BLADE SURG 15 STRL LF DISP TIS (BLADE) ×1 IMPLANT
BLADE SURG 15 STRL SS (BLADE) ×1
BNDG ELASTIC 2 VLCR STRL LF (GAUZE/BANDAGES/DRESSINGS) ×2 IMPLANT
BNDG ESMARK 4X9 LF (GAUZE/BANDAGES/DRESSINGS) IMPLANT
BNDG ESMARK 6X9 LF (GAUZE/BANDAGES/DRESSINGS)
CHLORAPREP W/TINT 26ML (MISCELLANEOUS) ×2 IMPLANT
CLOTH BEACON ORANGE TIMEOUT ST (SAFETY) ×2 IMPLANT
COVER MAYO STAND STRL (DRAPES) ×2 IMPLANT
COVER TABLE BACK 60X90 (DRAPES) ×2 IMPLANT
CUFF TOURNIQUET SINGLE 18IN (TOURNIQUET CUFF) ×2 IMPLANT
CUFF TOURNIQUET SINGLE 24IN (TOURNIQUET CUFF) IMPLANT
CUFF TOURNIQUET SINGLE 34IN LL (TOURNIQUET CUFF) IMPLANT
DECANTER SPIKE VIAL GLASS SM (MISCELLANEOUS) IMPLANT
DRAPE EXTREMITY T 121X128X90 (DRAPE) ×2 IMPLANT
DRAPE OEC MINIVIEW 54X84 (DRAPES) IMPLANT
DRAPE SURG 17X23 STRL (DRAPES) IMPLANT
DRAPE U 20/CS (DRAPES) ×2 IMPLANT
DRAPE U-SHAPE 47X51 STRL (DRAPES) IMPLANT
DURA STEPPER LG (CAST SUPPLIES) IMPLANT
DURA STEPPER MED (CAST SUPPLIES) IMPLANT
DURA STEPPER SML (CAST SUPPLIES) IMPLANT
ELECT REM PT RETURN 9FT ADLT (ELECTROSURGICAL) ×2
ELECTRODE REM PT RTRN 9FT ADLT (ELECTROSURGICAL) ×1 IMPLANT
GAUZE SPONGE 4X4 16PLY XRAY LF (GAUZE/BANDAGES/DRESSINGS) IMPLANT
GAUZE XEROFORM 1X8 LF (GAUZE/BANDAGES/DRESSINGS) ×2 IMPLANT
GLOVE BIO SURGEON STRL SZ 6.5 (GLOVE) ×2 IMPLANT
GLOVE BIO SURGEON STRL SZ7 (GLOVE) ×2 IMPLANT
GLOVE BIO SURGEON STRL SZ7.5 (GLOVE) ×2 IMPLANT
GLOVE BIOGEL M STRL SZ7.5 (GLOVE) ×2 IMPLANT
GLOVE BIOGEL PI IND STRL 7.0 (GLOVE) ×1 IMPLANT
GLOVE BIOGEL PI IND STRL 8 (GLOVE) ×2 IMPLANT
GLOVE BIOGEL PI INDICATOR 7.0 (GLOVE) ×1
GLOVE BIOGEL PI INDICATOR 8 (GLOVE) ×2
GOWN PREVENTION PLUS XLARGE (GOWN DISPOSABLE) ×2 IMPLANT
NEEDLE HYPO 22GX1.5 SAFETY (NEEDLE) IMPLANT
NS IRRIG 1000ML POUR BTL (IV SOLUTION) ×2 IMPLANT
PACK BASIN DAY SURGERY FS (CUSTOM PROCEDURE TRAY) ×2 IMPLANT
PAD CAST 4YDX4 CTTN HI CHSV (CAST SUPPLIES) ×1 IMPLANT
PADDING CAST ABS 4INX4YD NS (CAST SUPPLIES)
PADDING CAST ABS COTTON 4X4 ST (CAST SUPPLIES) IMPLANT
PADDING CAST COTTON 4X4 STRL (CAST SUPPLIES) ×1
PENCIL BUTTON HOLSTER BLD 10FT (ELECTRODE) ×2 IMPLANT
SLEEVE SCD COMPRESS KNEE MED (MISCELLANEOUS) IMPLANT
SPONGE LAP 18X18 X RAY DECT (DISPOSABLE) IMPLANT
SPONGE LAP 4X18 X RAY DECT (DISPOSABLE) IMPLANT
SUT ETHILON 2 0 FS 18 (SUTURE) ×2 IMPLANT
SUT ETHILON 3 0 PS 1 (SUTURE) IMPLANT
SUT ETHILON 4 0 PS 2 18 (SUTURE) IMPLANT
SUT VIC AB 2-0 SH 27 (SUTURE)
SUT VIC AB 2-0 SH 27XBRD (SUTURE) IMPLANT
SYR BULB 3OZ (MISCELLANEOUS) ×2 IMPLANT
SYR CONTROL 10ML LL (SYRINGE) IMPLANT
TOWEL OR 17X24 6PK STRL BLUE (TOWEL DISPOSABLE) ×2 IMPLANT
UNDERPAD 30X30 INCONTINENT (UNDERPADS AND DIAPERS) ×2 IMPLANT
WATER STERILE IRR 1000ML POUR (IV SOLUTION) ×2 IMPLANT

## 2011-11-10 NOTE — Anesthesia Preprocedure Evaluation (Signed)
Anesthesia Evaluation  Patient identified by MRN, date of birth, ID band Patient awake    Reviewed: Allergy & Precautions, H&P , NPO status , Patient's Chart, lab work & pertinent test results, reviewed documented beta blocker date and time   History of Anesthesia Complications (+) PONV  Airway Mallampati: II TM Distance: >3 FB Neck ROM: full    Dental   Pulmonary neg pulmonary ROS,  breath sounds clear to auscultation        Cardiovascular hypertension, Pt. on medications Rhythm:regular     Neuro/Psych  Headaches, negative psych ROS   GI/Hepatic negative GI ROS, Neg liver ROS,   Endo/Other  Hypothyroidism Morbid obesity  Renal/GU negative Renal ROS  negative genitourinary   Musculoskeletal   Abdominal   Peds  Hematology negative hematology ROS (+)   Anesthesia Other Findings See surgeon's H&P   Reproductive/Obstetrics negative OB ROS                           Anesthesia Physical Anesthesia Plan  ASA: III  Anesthesia Plan: MAC and Regional   Post-op Pain Management:    Induction: Intravenous  Airway Management Planned: Simple Face Mask  Additional Equipment:   Intra-op Plan:   Post-operative Plan:   Informed Consent: I have reviewed the patients History and Physical, chart, labs and discussed the procedure including the risks, benefits and alternatives for the proposed anesthesia with the patient or authorized representative who has indicated his/her understanding and acceptance.   Dental Advisory Given  Plan Discussed with: CRNA and Surgeon  Anesthesia Plan Comments:         Anesthesia Quick Evaluation

## 2011-11-10 NOTE — Op Note (Signed)
Preoperative diagnosis: Left second toe distal phalanx osteomyelitis versus squamous cell cancer.  Postoperative diagnosis: Same  Procedure: Left second toe distal phalanx amputation to the DIP joint  Surgeon: Feliberto Gottron. Turner Daniels M.D.  First assistant: Shirl Harris PA-C  Anesthetic: Popliteal block  Tourniquet time 10 minutes  Indications for procedure: 71 year old woman with 4 year history of a verrucous appearing a mass at the distal aspect of her left second toe. X-rays are consistent with osteomyelitis in that the distal phalanx has been lysed back almost to the DIP joint. There is also intermittent drainage from the birth this mass although the serous and she is required no treatment with antibiotics. She is taken for DIP joint amputation and it should be noted that her second toe is longer than her great toe on both feet and his lack should give her more pleasing appearance to her foot and make shoe wear he is here. The second toe on the contralateral foot is also longer than the great toe but there is no verrucous mass.  Description of procedure: Patient identified by arm band receive preoperative popliteal block in the holding area at [facility]. She received preoperative IV antibiotics and was taken to operating room 6 for the appropriate anesthetic monitors were attached. Tourniquet was applied to the left ankle and left foot prepped and draped in usual sterile fashion from the toes to the tourniquet. We did not actually use the pneumatic tourniquet but instead placed a finger from a #6 glove on the second toe proximally and held it there with a hemostat. A timeout procedure was performed. We began the operation by making a fishmouth incision for the DIP joint amputation to the skin subcutaneous tissue and tendons and 1 full-thickness cut. We had cut back to normal-appearing tissue and irrigated with normal saline solution. The glove tourniquet was removed the digital arteries  were cauterized and we then closed with 3 horizontal mattress 2-0 nylon sutures. A dressing of Xerofoam, 20 years, 4 x 4, 2 Ace wrap and a postop shoe were applied. Patient was taken to the recovery without difficulty.

## 2011-11-10 NOTE — Anesthesia Procedure Notes (Signed)
Anesthesia Regional Block:  Popliteal block  Pre-Anesthetic Checklist: ,, timeout performed, Correct Patient, Correct Site, Correct Laterality, Correct Procedure, Correct Position, site marked, Risks and benefits discussed,  Surgical consent,  Pre-op evaluation,  At surgeon's request and post-op pain management  Laterality: Left  Prep: chloraprep       Needles:   Needle Type: Other   (Arrow Echogenic)   Needle Length: 9cm  Needle Gauge: 21    Additional Needles:  Procedures: ultrasound guided Popliteal block Narrative:  Start time: 11/10/2011 9:49 AM End time: 11/10/2011 9:57 AM Injection made incrementally with aspirations every 5 mL.  Performed by: Personally  Anesthesiologist: Aldona Lento, MD  Additional Notes: Ultrasound guidance used to: id relevant anatomy, confirm needle position, local anesthetic spread, avoidance of vascular puncture. Picture saved. No complications. Block performed personally by Janetta Hora. Gelene Mink, MD  .    Popliteal block

## 2011-11-10 NOTE — Progress Notes (Signed)
Assisted Dr. Frederick with left, ultrasound guided, popliteal/saphenous block. Side rails up, monitors on throughout procedure. See vital signs in flow sheet. Tolerated Procedure well. 

## 2011-11-10 NOTE — Transfer of Care (Signed)
Immediate Anesthesia Transfer of Care Note  Patient: Cassidy [name]  Procedure(s) Performed: Procedure(s) (LRB): AMPUTATION RAY (Left)  Patient Location: PACU  Anesthesia Type: MAC and Regional  Level of Consciousness: awake, alert  and oriented  Airway & Oxygen Therapy: Patient Spontanous Breathing and Patient connected to face mask oxygen  Post-op Assessment: Report given to PACU RN and Post -op Vital signs reviewed and stable  Post vital signs: Reviewed and stable  Complications: No apparent anesthesia complications

## 2011-11-10 NOTE — Interval H&P Note (Signed)
History and Physical Interval Note:  11/10/2011 10:36 AM  Cassidy [name]  has presented today for surgery, with the diagnosis of LEFT 2ND TOE MASS  The various methods of treatment have been discussed with the patient and family. After consideration of risks, benefits and other options for treatment, the patient has consented to  Procedure(s) (LRB): AMPUTATION RAY (Left) as a surgical intervention .  The patient's history has been reviewed, patient examined, no change in status, stable for surgery.  I have reviewed the patient's chart and labs.  Questions were answered to the patient's satisfaction.     Nestor Lewandowsky

## 2011-11-11 ENCOUNTER — Encounter (HOSPITAL_BASED_OUTPATIENT_CLINIC_OR_DEPARTMENT_OTHER): Payer: Self-pay | Admitting: Orthopedic Surgery

## 2011-11-11 LAB — POCT HEMOGLOBIN-HEMACUE: Hemoglobin: 14.5 g/dL (ref 12.0–15.0)

## 2011-11-11 NOTE — Anesthesia Postprocedure Evaluation (Signed)
Anesthesia Post Note  Patient: Cassidy [name]  Procedure(s) Performed: Procedure(s) (LRB): AMPUTATION RAY (Left)  Anesthesia type: MAC  Patient location: PACU  Post pain: Pain level controlled  Post assessment: Patient's Cardiovascular Status Stable  Last Vitals:  Filed Vitals:   11/10/11 1158  BP: 134/67  Pulse: 83  Temp: 36.4 C  Resp: 16    Post vital signs: Reviewed and stable  Level of consciousness: alert  Complications: No apparent anesthesia complications

## 2011-11-13 ENCOUNTER — Encounter (HOSPITAL_BASED_OUTPATIENT_CLINIC_OR_DEPARTMENT_OTHER): Payer: Self-pay

## 2011-12-15 ENCOUNTER — Other Ambulatory Visit: Payer: Medicare Other | Admitting: Internal Medicine

## 2011-12-15 DIAGNOSIS — Z79899 Other long term (current) drug therapy: Secondary | ICD-10-CM | POA: Diagnosis not present

## 2011-12-15 DIAGNOSIS — E039 Hypothyroidism, unspecified: Secondary | ICD-10-CM | POA: Diagnosis not present

## 2011-12-15 DIAGNOSIS — I10 Essential (primary) hypertension: Secondary | ICD-10-CM | POA: Diagnosis not present

## 2011-12-15 LAB — CBC WITH DIFFERENTIAL/PLATELET
Basophils Absolute: 0.1 10*3/uL (ref 0.0–0.1)
Basophils Relative: 1 % (ref 0–1)
Eosinophils Absolute: 0.2 10*3/uL (ref 0.0–0.7)
Eosinophils Relative: 3 % (ref 0–5)
Lymphs Abs: 2.2 10*3/uL (ref 0.7–4.0)
MCH: 30 pg (ref 26.0–34.0)
MCV: 89 fL (ref 78.0–100.0)
Neutrophils Relative %: 49 % (ref 43–77)
Platelets: 243 10*3/uL (ref 150–400)
RBC: 4.54 MIL/uL (ref 3.87–5.11)
RDW: 14 % (ref 11.5–15.5)

## 2011-12-15 LAB — HEPATIC FUNCTION PANEL
Albumin: 4.3 g/dL (ref 3.5–5.2)
Alkaline Phosphatase: 74 U/L (ref 39–117)
Total Bilirubin: 0.5 mg/dL (ref 0.3–1.2)
Total Protein: 7 g/dL (ref 6.0–8.3)

## 2011-12-16 ENCOUNTER — Ambulatory Visit (INDEPENDENT_AMBULATORY_CARE_PROVIDER_SITE_OTHER): Payer: Medicare Other | Admitting: Internal Medicine

## 2011-12-16 ENCOUNTER — Ambulatory Visit: Payer: Medicare Other | Admitting: Internal Medicine

## 2011-12-16 ENCOUNTER — Encounter: Payer: Self-pay | Admitting: Internal Medicine

## 2011-12-16 DIAGNOSIS — E039 Hypothyroidism, unspecified: Secondary | ICD-10-CM | POA: Diagnosis not present

## 2011-12-16 DIAGNOSIS — B351 Tinea unguium: Secondary | ICD-10-CM | POA: Diagnosis not present

## 2011-12-16 NOTE — Patient Instructions (Addendum)
Continue Lamisil and return in 2 months for follow up.

## 2011-12-16 NOTE — Progress Notes (Signed)
  Subjective:    Patient ID: Cassidy [name], female    DOB: 03/20/1941, 71 y.o.   MRN: [id number]  HPI 71 year old white female in today with onychomycosis. Has been on Lamisil for several weeks. Infected left second toe lesion has been removed by Dr. Turner Daniels and is well-healed.  History of hypertension, hypothyroidism, impaired glucose tolerance.    Review of Systems     Objective:   Physical Exam persistent onychomycosis. Not much change yet on Lamisil. Explained to her it might be several more weeks before we see a difference. She has bilateral bunions. She did lose left second toe nail with distal amputation of toe with an abnormal lesion that was removed but was found to be benign. CBC is normal on Lamisil as are liver functions. TSH is normal. No thyromegaly. Chest clear to auscultation. Cardiac exam regular rate and rhythm. Extremities without edema.        Assessment & Plan:  Onychomycosis  Impaired glucose tolerance  Hypothyroidism  Hypertension  Plan: Return in 2 months for followup of onychomycosis. Continue Lamisil 250 mg daily. If no improvement in 2 months, consider changing to another agent.

## 2012-02-10 ENCOUNTER — Ambulatory Visit (INDEPENDENT_AMBULATORY_CARE_PROVIDER_SITE_OTHER): Payer: Medicare Other | Admitting: Internal Medicine

## 2012-02-10 ENCOUNTER — Encounter: Payer: Self-pay | Admitting: Internal Medicine

## 2012-02-10 VITALS — BP 138/86 | HR 80 | Temp 98.7°F | Wt 222.0 lb

## 2012-02-10 DIAGNOSIS — Z23 Encounter for immunization: Secondary | ICD-10-CM | POA: Diagnosis not present

## 2012-02-10 DIAGNOSIS — L84 Corns and callosities: Secondary | ICD-10-CM

## 2012-02-10 DIAGNOSIS — B351 Tinea unguium: Secondary | ICD-10-CM

## 2012-02-10 NOTE — Progress Notes (Signed)
  Subjective:    Patient ID: Cassidy [name], female    DOB: 1940/10/23, 71 y.o.   MRN: [id number]  HPI Patient has developed inflamed callus medial aspect of second toe where she recently had surgery. Needs to return to see Dr. Turner Daniels regarding this. I am concerned this could be infected. She is here today for followup on onychomycosis. Lamisil does not seem to be working and she's been on it for several months. Still has significant toenail fungus. Continues to get pedicures at nail salon. Wants to have prescription get Zostavax vaccine at pharmacy.      Review of Systems     Objective:   Physical Exam linear inflamed callus medial aspect second toe with erythema. Missing second toe nail after surgery to remove benign verrucous lesion. Significant thickening of all toenails        Assessment & Plan:  Onychomycosis  Inflamed callus second toe  Plan: Zostavax vaccine prescription given. Patient promises to call orthopedist regarding callus. I am concerned to be secondarily infected. Discontinue Lamisil. Start Gris-Peg 250 mg twice daily. 6 weeks is to have CBC and liver functions plus office visit followup on onychomycosis.

## 2012-02-10 NOTE — Patient Instructions (Addendum)
Stop Lamisil. Start griseofulvin 250 mg twice daily. Return in 6 weeks for office visit and lab work. Please call Dr. Turner Daniels regarding callus of toe

## 2012-02-11 DIAGNOSIS — L97509 Non-pressure chronic ulcer of other part of unspecified foot with unspecified severity: Secondary | ICD-10-CM | POA: Diagnosis not present

## 2012-03-17 DIAGNOSIS — L97509 Non-pressure chronic ulcer of other part of unspecified foot with unspecified severity: Secondary | ICD-10-CM | POA: Diagnosis not present

## 2012-03-22 ENCOUNTER — Other Ambulatory Visit: Payer: Medicare Other | Admitting: Internal Medicine

## 2012-03-22 DIAGNOSIS — Z79899 Other long term (current) drug therapy: Secondary | ICD-10-CM | POA: Diagnosis not present

## 2012-03-22 LAB — CBC WITH DIFFERENTIAL/PLATELET
Basophils Relative: 1 % (ref 0–1)
HCT: 41.3 % (ref 36.0–46.0)
Hemoglobin: 13.7 g/dL (ref 12.0–15.0)
Lymphocytes Relative: 33 % (ref 12–46)
Lymphs Abs: 2.1 10*3/uL (ref 0.7–4.0)
MCHC: 33.2 g/dL (ref 30.0–36.0)
Monocytes Absolute: 0.5 10*3/uL (ref 0.1–1.0)
Monocytes Relative: 7 % (ref 3–12)
Neutro Abs: 3.5 10*3/uL (ref 1.7–7.7)
Neutrophils Relative %: 56 % (ref 43–77)
RBC: 4.59 MIL/uL (ref 3.87–5.11)
WBC: 6.2 10*3/uL (ref 4.0–10.5)

## 2012-03-22 LAB — HEPATIC FUNCTION PANEL
ALT: 19 U/L (ref 0–35)
AST: 20 U/L (ref 0–37)
Bilirubin, Direct: 0.1 mg/dL (ref 0.0–0.3)
Indirect Bilirubin: 0.4 mg/dL (ref 0.0–0.9)
Total Bilirubin: 0.5 mg/dL (ref 0.3–1.2)

## 2012-03-23 ENCOUNTER — Ambulatory Visit (INDEPENDENT_AMBULATORY_CARE_PROVIDER_SITE_OTHER): Payer: Medicare Other | Admitting: Internal Medicine

## 2012-03-23 ENCOUNTER — Encounter: Payer: Self-pay | Admitting: Internal Medicine

## 2012-03-23 VITALS — BP 126/84 | HR 88 | Temp 98.4°F | Wt 221.0 lb

## 2012-03-23 DIAGNOSIS — L089 Local infection of the skin and subcutaneous tissue, unspecified: Secondary | ICD-10-CM | POA: Diagnosis not present

## 2012-03-23 MED ORDER — CEFTRIAXONE SODIUM 1 G IJ SOLR
1.0000 g | Freq: Once | INTRAMUSCULAR | Status: AC
  Administered 2012-03-23: 1 g via INTRAMUSCULAR

## 2012-03-28 NOTE — Progress Notes (Signed)
  Subjective:    Patient ID: Cassidy [name], female    DOB: 1940-06-09, 71 y.o.   MRN: [id number]  HPI  Pt. here today for recheck on toe nail fungus. No significant improvement. Also, today where she had surgery for removal of warty lesion has not healed well. May have secondary infection or early osteomyelitis. In October, toe had callus that was not healing well and was started on doxycycline for 2 weeks and she agreed return to orthopedist.    Review of Systems     Objective:   Physical Exam thickened toe nails consistent with onychomycosis have not responded to Lamisil or Grispeg. Patient denies noncompliance with medication.        Assessment & Plan:  Onychomycosis-has not responded to Lamisil or Grispeg. Refer to dermatologist.  Verrucous lesion left second toe removed July 2013 slow to heal. Still worried about possible osteomyelitis. Refer back to orthopedist.

## 2012-04-01 DIAGNOSIS — L97509 Non-pressure chronic ulcer of other part of unspecified foot with unspecified severity: Secondary | ICD-10-CM | POA: Diagnosis not present

## 2012-04-15 DIAGNOSIS — L97509 Non-pressure chronic ulcer of other part of unspecified foot with unspecified severity: Secondary | ICD-10-CM | POA: Diagnosis not present

## 2012-04-23 NOTE — Patient Instructions (Addendum)
Refer to dermatology for evaluation of toenail fungus

## 2012-05-05 DIAGNOSIS — L97509 Non-pressure chronic ulcer of other part of unspecified foot with unspecified severity: Secondary | ICD-10-CM | POA: Diagnosis not present

## 2012-06-07 ENCOUNTER — Ambulatory Visit
Admission: RE | Admit: 2012-06-07 | Discharge: 2012-06-07 | Disposition: A | Discharge status: Home / Self Care | Payer: Medicare Other | Source: Ambulatory Visit | Attending: Internal Medicine | Admitting: Internal Medicine

## 2012-06-07 ENCOUNTER — Encounter: Payer: Self-pay | Admitting: Internal Medicine

## 2012-06-07 ENCOUNTER — Ambulatory Visit (INDEPENDENT_AMBULATORY_CARE_PROVIDER_SITE_OTHER): Payer: Medicare Other | Admitting: Internal Medicine

## 2012-06-07 VITALS — BP 134/80 | HR 92 | Temp 99.2°F | Wt 222.0 lb

## 2012-06-07 DIAGNOSIS — L03039 Cellulitis of unspecified toe: Secondary | ICD-10-CM

## 2012-06-07 DIAGNOSIS — L0390 Cellulitis, unspecified: Secondary | ICD-10-CM

## 2012-06-07 DIAGNOSIS — I10 Essential (primary) hypertension: Secondary | ICD-10-CM

## 2012-06-07 DIAGNOSIS — L0291 Cutaneous abscess, unspecified: Secondary | ICD-10-CM

## 2012-06-07 DIAGNOSIS — R7302 Impaired glucose tolerance (oral): Secondary | ICD-10-CM

## 2012-06-07 DIAGNOSIS — R7309 Other abnormal glucose: Secondary | ICD-10-CM

## 2012-06-07 DIAGNOSIS — S92919A Unspecified fracture of unspecified toe(s), initial encounter for closed fracture: Secondary | ICD-10-CM | POA: Diagnosis not present

## 2012-06-07 DIAGNOSIS — S93106A Unspecified dislocation of unspecified toe(s), initial encounter: Secondary | ICD-10-CM

## 2012-06-07 DIAGNOSIS — L03032 Cellulitis of left toe: Secondary | ICD-10-CM

## 2012-06-07 DIAGNOSIS — E039 Hypothyroidism, unspecified: Secondary | ICD-10-CM

## 2012-06-07 DIAGNOSIS — S92912A Unspecified fracture of left toe(s), initial encounter for closed fracture: Secondary | ICD-10-CM

## 2012-06-07 DIAGNOSIS — L02619 Cutaneous abscess of unspecified foot: Secondary | ICD-10-CM

## 2012-06-07 MED ORDER — CEFTRIAXONE SODIUM 1 G IJ SOLR
1.0000 g | Freq: Once | INTRAMUSCULAR | Status: AC
  Administered 2012-06-07: 1 g via INTRAMUSCULAR

## 2012-06-07 NOTE — Patient Instructions (Signed)
You have been given Rocephin IM today. Take Levaquin 750 mg daily for 10 days. We will arrange for urgent orthopedic consultation.

## 2012-06-07 NOTE — Progress Notes (Signed)
  Subjective:    Patient ID: Cassidy [name], female    DOB: Nov 16, 1940, 72 y.o.   MRN: [id number]  HPI 72 year old white female who had a benign verrucous lesion removed from left second toe  by Dr. Turner Daniels July 2013. By October she had developed a callus on the medial aspect of the left second toe which was inflamed. She was told to go back to see orthopedist. She also had onychomycosis of toenails and was referred to dermatologist after Lamisil failed. She is yet to see dermatologist. Says she has appointment see dermatologist later this week. Says last week she wore closed toe shoes and her toe once again became very inflamed. She has a nonhealing ulcer medial aspect of left second toe. Patient has noted increased redness and swelling of left second and   Review of Systems     Objective:   Physical Exam erythema of the entire left second toe. Increased warmth of toe. Nonhealing ulcer medial aspect left second toe that is not draining. Toe was swollen to twice normal size.  X-ray shows avulsion fracture and dislocation. No osteomyelitis seen.      Assessment & Plan:  Cellulitis left second toe  Avulsion fracture left second toe  Dislocation left second toe DIP joint  Hypertension  Hypothyroidism  Impaired glucose tolerance  Plan: Rocephin 1 g IM. Levaquin 750 mg daily for 10 days. Appointment to see orthopedist right away regarding x-ray findings and ulcer. Patient warned that this condition must be taken seriously due to possibility of bone becoming infected. She has a history of glucose intolerance and hypothyroidism as well as hypertension. Tetanus immunization is up-to-date.  25 minutes spent with patient evaluating toe, discussing issues with her and arrange an orthopedic consultation

## 2012-06-08 ENCOUNTER — Telehealth: Payer: Self-pay

## 2012-06-08 ENCOUNTER — Telehealth: Payer: Self-pay | Admitting: Internal Medicine

## 2012-06-08 DIAGNOSIS — L03039 Cellulitis of unspecified toe: Secondary | ICD-10-CM | POA: Diagnosis not present

## 2012-06-08 DIAGNOSIS — L02619 Cutaneous abscess of unspecified foot: Secondary | ICD-10-CM | POA: Diagnosis not present

## 2012-06-08 NOTE — Telephone Encounter (Signed)
Patient has an appointment with Dr. Turner Daniels today at 10:00 am. She is aware of this

## 2012-06-08 NOTE — Telephone Encounter (Signed)
Noted pt scheduled for toe amputation on Friday Feb 21st.

## 2012-06-11 DIAGNOSIS — G5790 Unspecified mononeuropathy of unspecified lower limb: Secondary | ICD-10-CM | POA: Diagnosis not present

## 2012-06-11 DIAGNOSIS — M869 Osteomyelitis, unspecified: Secondary | ICD-10-CM | POA: Diagnosis not present

## 2012-06-11 DIAGNOSIS — S91109A Unspecified open wound of unspecified toe(s) without damage to nail, initial encounter: Secondary | ICD-10-CM | POA: Diagnosis not present

## 2012-10-14 ENCOUNTER — Other Ambulatory Visit: Payer: Self-pay | Admitting: Internal Medicine

## 2012-11-22 ENCOUNTER — Other Ambulatory Visit: Payer: Self-pay | Admitting: Internal Medicine

## 2012-11-22 NOTE — Telephone Encounter (Signed)
Have refilled Amlodipine and Synthroid. Please check about needing appt for BP check and TSH

## 2013-01-20 ENCOUNTER — Telehealth: Payer: Self-pay

## 2013-01-20 ENCOUNTER — Other Ambulatory Visit: Payer: Self-pay

## 2013-01-20 DIAGNOSIS — Z1239 Encounter for other screening for malignant neoplasm of breast: Secondary | ICD-10-CM

## 2013-02-16 ENCOUNTER — Ambulatory Visit
Admission: RE | Admit: 2013-02-16 | Discharge: 2013-02-16 | Disposition: A | Discharge status: Home / Self Care | Payer: Medicare Other | Source: Ambulatory Visit | Attending: Internal Medicine | Admitting: Internal Medicine

## 2013-02-16 DIAGNOSIS — Z1231 Encounter for screening mammogram for malignant neoplasm of breast: Secondary | ICD-10-CM | POA: Diagnosis not present

## 2013-03-03 ENCOUNTER — Other Ambulatory Visit: Payer: Self-pay | Admitting: Internal Medicine

## 2013-03-03 NOTE — Telephone Encounter (Signed)
Has not been seen in some time. Please have Marcelino Duster book 6 month recheck with [facility], TSH and office visit.

## 2013-03-28 ENCOUNTER — Other Ambulatory Visit: Payer: Medicare Other | Admitting: Internal Medicine

## 2013-03-28 DIAGNOSIS — E669 Obesity, unspecified: Secondary | ICD-10-CM

## 2013-03-28 DIAGNOSIS — E039 Hypothyroidism, unspecified: Secondary | ICD-10-CM

## 2013-03-28 DIAGNOSIS — E559 Vitamin D deficiency, unspecified: Secondary | ICD-10-CM | POA: Diagnosis not present

## 2013-03-28 DIAGNOSIS — I10 Essential (primary) hypertension: Secondary | ICD-10-CM | POA: Diagnosis not present

## 2013-03-28 DIAGNOSIS — Z0000 Encounter for general adult medical examination without abnormal findings: Secondary | ICD-10-CM | POA: Diagnosis not present

## 2013-03-28 DIAGNOSIS — Z862 Personal history of diseases of the blood and blood-forming organs and certain disorders involving the immune mechanism: Secondary | ICD-10-CM | POA: Diagnosis not present

## 2013-03-28 DIAGNOSIS — Z87898 Personal history of other specified conditions: Secondary | ICD-10-CM

## 2013-03-28 LAB — COMPREHENSIVE METABOLIC PANEL
Albumin: 4.5 g/dL (ref 3.5–5.2)
BUN: 16 mg/dL (ref 6–23)
CO2: 29 mEq/L (ref 19–32)
Calcium: 9.7 mg/dL (ref 8.4–10.5)
Chloride: 102 mEq/L (ref 96–112)
Glucose, Bld: 95 mg/dL (ref 70–99)
Potassium: 4.4 mEq/L (ref 3.5–5.3)

## 2013-03-28 LAB — CBC WITH DIFFERENTIAL/PLATELET
Eosinophils Relative: 3 % (ref 0–5)
HCT: 42.5 % (ref 36.0–46.0)
Lymphocytes Relative: 36 % (ref 12–46)
MCHC: 34.1 g/dL (ref 30.0–36.0)
MCV: 88.7 fL (ref 78.0–100.0)
Monocytes Absolute: 0.4 10*3/uL (ref 0.1–1.0)
RDW: 14 % (ref 11.5–15.5)
WBC: 5.8 10*3/uL (ref 4.0–10.5)

## 2013-03-28 LAB — LIPID PANEL
HDL: 55 mg/dL (ref >39)
Triglycerides: 93 mg/dL (ref <150)

## 2013-03-28 LAB — HEMOGLOBIN A1C
Hgb A1c MFr Bld: 5.6 %
Mean Plasma Glucose: 114 mg/dL

## 2013-03-29 ENCOUNTER — Encounter: Payer: Self-pay | Admitting: Internal Medicine

## 2013-03-29 ENCOUNTER — Ambulatory Visit (INDEPENDENT_AMBULATORY_CARE_PROVIDER_SITE_OTHER): Payer: Medicare Other | Admitting: Internal Medicine

## 2013-03-29 VITALS — BP 142/102 | HR 96 | Temp 98.1°F | Ht 66.0 in | Wt 230.0 lb

## 2013-03-29 DIAGNOSIS — E669 Obesity, unspecified: Secondary | ICD-10-CM

## 2013-03-29 DIAGNOSIS — Z23 Encounter for immunization: Secondary | ICD-10-CM

## 2013-03-29 DIAGNOSIS — Z0000 Encounter for general adult medical examination without abnormal findings: Secondary | ICD-10-CM | POA: Diagnosis not present

## 2013-03-29 DIAGNOSIS — E7800 Pure hypercholesterolemia, unspecified: Secondary | ICD-10-CM | POA: Diagnosis not present

## 2013-03-29 DIAGNOSIS — I10 Essential (primary) hypertension: Secondary | ICD-10-CM

## 2013-03-29 DIAGNOSIS — E039 Hypothyroidism, unspecified: Secondary | ICD-10-CM | POA: Diagnosis not present

## 2013-03-29 DIAGNOSIS — R7302 Impaired glucose tolerance (oral): Secondary | ICD-10-CM

## 2013-03-29 LAB — POCT URINALYSIS DIPSTICK
Blood, UA: NEGATIVE
Ketones, UA: NEGATIVE
Leukocytes, UA: NEGATIVE
Protein, UA: NEGATIVE
Spec Grav, UA: 1.015
Urobilinogen, UA: 0.2
pH, UA: 6

## 2013-03-29 LAB — TSH: TSH: 1.737 u[IU]/mL (ref 0.350–4.500)

## 2013-03-29 LAB — VITAMIN D 25 HYDROXY (VIT D DEFICIENCY, FRACTURES): Vit D, 25-Hydroxy: 36 ng/mL (ref 30–89)

## 2013-04-11 ENCOUNTER — Telehealth: Payer: Self-pay | Admitting: Internal Medicine

## 2013-04-11 NOTE — Telephone Encounter (Signed)
Spoke with patient @ [phone]; patient states that she wouldn't say that she's really sick; no fever.  Just hacking cough that starts when she lays down at night.  Patient states she's not a diabetic.  Refused an appointment.  States she's going to be seen next week here.  Advised patient she can get Delsym over the counter and use it.

## 2013-04-18 ENCOUNTER — Other Ambulatory Visit: Payer: Self-pay | Admitting: Internal Medicine

## 2013-04-18 ENCOUNTER — Ambulatory Visit: Payer: BLUE CROSS/BLUE SHIELD | Admitting: Internal Medicine

## 2013-04-22 ENCOUNTER — Encounter: Payer: Self-pay | Admitting: Internal Medicine

## 2013-04-22 ENCOUNTER — Ambulatory Visit (INDEPENDENT_AMBULATORY_CARE_PROVIDER_SITE_OTHER): Payer: Medicare Other | Admitting: Internal Medicine

## 2013-04-22 VITALS — BP 124/78 | Temp 98.9°F | Wt 240.0 lb

## 2013-04-22 DIAGNOSIS — I10 Essential (primary) hypertension: Secondary | ICD-10-CM

## 2013-04-22 MED ORDER — HYDROCODONE-HOMATROPINE 5-1.5 MG/5ML PO SYRP: 5.0000 mL | ORAL_SOLUTION | Freq: Three times a day (TID) | ORAL | Status: DC | PRN

## 2013-04-22 MED ORDER — LEVOFLOXACIN 500 MG PO TABS: 500.0000 mg | ORAL_TABLET | Freq: Every day | ORAL | Status: DC

## 2013-05-22 ENCOUNTER — Encounter: Payer: Self-pay | Admitting: Internal Medicine

## 2013-05-22 NOTE — Progress Notes (Signed)
   Subjective:    Patient ID: Cassidy [name], female    DOB: 12-30-1940, 73 y.o.   MRN: [id number]  HPI  Three-week followup today on blood pressure. Last visit blood pressure was elevated during physical exam. Now is under excellent control. No complaints or problems.    Review of Systems     Objective:   Physical Exam Chest clear to auscultation. Cardiac exam regular rate and rhythm. Extremities without edema       Assessment & Plan:  Essential hypertension  Plan: Patient is to return in 6 months for office visit blood pressure check and TSH

## 2013-05-22 NOTE — Patient Instructions (Signed)
Continue same medications and return in 6 months 

## 2013-05-25 ENCOUNTER — Other Ambulatory Visit: Payer: Self-pay | Admitting: Internal Medicine

## 2013-07-05 ENCOUNTER — Other Ambulatory Visit: Payer: Self-pay | Admitting: Internal Medicine

## 2013-07-13 ENCOUNTER — Encounter: Payer: Self-pay | Admitting: Podiatrist

## 2013-07-13 ENCOUNTER — Ambulatory Visit (INDEPENDENT_AMBULATORY_CARE_PROVIDER_SITE_OTHER): Payer: Medicare Other | Admitting: Podiatrist

## 2013-07-13 VITALS — BP 150/82 | HR 92 | Resp 16

## 2013-07-13 DIAGNOSIS — M79609 Pain in unspecified limb: Secondary | ICD-10-CM

## 2013-07-13 DIAGNOSIS — Q828 Other specified congenital malformations of skin: Secondary | ICD-10-CM | POA: Diagnosis not present

## 2013-07-13 DIAGNOSIS — B351 Tinea unguium: Secondary | ICD-10-CM

## 2013-07-13 DIAGNOSIS — G629 Polyneuropathy, unspecified: Secondary | ICD-10-CM

## 2013-07-13 DIAGNOSIS — M216X9 Other acquired deformities of unspecified foot: Secondary | ICD-10-CM

## 2013-07-13 DIAGNOSIS — G589 Mononeuropathy, unspecified: Secondary | ICD-10-CM

## 2013-07-13 NOTE — Progress Notes (Signed)
   Chief Complaint  Patient presents with  . Debridement    trim toenails  . Callouses    trim calluses plantar bilateral      HPI: Patient is 73 y.o. female who presents today for foot and nail care.  She has neuropathy and had an ulcer at the tip of the toe that became infected.  She saw her surgeon and he amputated the toe.  She is doing well s/p toe amputation. She has several calluses she needs trimmed and requests a nail trim as well.  Overall she states she is doing well.       Physical Exam GENERAL APPEARANCE: Alert, conversant. Appropriately groomed. No acute distress.  VASCULAR: Pedal pulses palpable at 2/4 DP and PT bilateral.  Capillary refill time is immediate to all digits,  Proximal to distal cooling it warm to warm.  Digital hair growth is present bilateral  NEUROLOGIC: sensation is decreased epicritically and protectively to 5.07 monofilament at 0/5 sites bilateral.  Light touch is decreased bilateral, vibratory sensation decreased bilateral MUSCULOSKELETAL: large hallux abducto valgus deformity is present left greater than right.  Amputation of the left 2nd toe is noted.  Digital contractures are present bilateral.  Dorsiflexed right great toe at the ipjoint is noted.   DERMATOLOGIC: pre-ulcerative lesion is present plantar right hallux-- it appears there was a blood blister present that went on to callus over and now once debrided, intact integument is present.  Its location and the pressure put on the toe puts it at risk for ulceration.  Hyperkeratotic lesion also present medial side of the first metatarsal head left, submet 4 left.  All toenails are thick, discolored, dystrophic and mycotic.    Assessment: neuropathy, mycotic toenails, pre ulcerative calluses bilateral feet  Plan: discussed the importance of careful watch over her feet with the pre ulcerative lesions and neuropathy.  She is at increased risk of another ulceration due to the non healing nature of the one on  her toe.  Debrided the toenails and calluses today without complication.  Discussed fixing the bunions which I recommended against considering her neuropathy, difficulty healing, and previous pulmonary embolism.  She will be seen back for routine care every 3 months or at her request.

## 2013-09-07 DIAGNOSIS — H52209 Unspecified astigmatism, unspecified eye: Secondary | ICD-10-CM | POA: Diagnosis not present

## 2013-09-07 DIAGNOSIS — H25049 Posterior subcapsular polar age-related cataract, unspecified eye: Secondary | ICD-10-CM | POA: Diagnosis not present

## 2013-09-18 NOTE — Patient Instructions (Signed)
Continue diet and exercise efforts. Return in 6 months.

## 2013-09-18 NOTE — Progress Notes (Signed)
Subjective:    Patient ID: Cassidy [name], female    DOB: 1941/01/29, 73 y.o.   MRN: [id number]  HPI 73 year old white female in today for health maintenance and evaluation of medical issues. History of hypertension, obesity, impaired glucose tolerance, hypothyroidism, history of migraine headaches. Needs to have diabetic eye exam.  Needs to have colonoscopy. She has GYN physician for [place].  In 2013 she had amputation of left second toe do to benign mass by Dr. Mayer Camel . History of onychomycosis.  History of right knee replacement July 2010. Subsequently developed bilateral pulmonary emboli after knee replacement surgery. Arthroscopic surgery for torn ligaments right knee June 2003. Cholecystectomy 2003. Right wrist ganglion cyst surgery 2008. Left ankle surgery 2005 involving of a bone graft.  Had colonoscopy done 2007 at [facility]. Previously had one here by Dr. Collene Mares September 2003. I do not have report from 2007 study. Had mammogram November 2014. Had tetanus immunization may 2012. Pneumovax immunization 2010. Has been given prescription to have Zostavax vaccine at pharmacy.  History of urinary tract infections but these improved status post GYN surgery in 2009.  Patient did have Cardiolite study done at [facility] April 2009 which was normal.  Social history: Patient is divorced. She does not smoke. Social alcohol consumption. 2 adult children. Daughter with history of breast cancer doing well. 3 grandchildren.  Family history: Father died at age 93 of a sudden MI. Mother died at age 72 of heart problems. One brother killed in an airplane crash at age 34.   2 sisters in good health.     Review of Systems  Constitutional: Negative.   HENT: Negative.   Eyes: Negative.   Respiratory: Negative for cough, chest tightness and wheezing.   Cardiovascular: Negative for chest pain and palpitations.  Endocrine:       History of hypothyroidism  Neurological: Negative.         History of migraine headaches but none recently  Hematological: Negative.   Psychiatric/Behavioral: Negative.        Objective:   Physical Exam  Vitals reviewed. Constitutional: She is oriented to person, place, and time. She appears well-developed and well-nourished. No distress.  HENT:  Head: Normocephalic and atraumatic.  Right Ear: External ear normal.  Left Ear: External ear normal.  Mouth/Throat: Oropharynx is clear and moist. No oropharyngeal exudate.  Eyes: Conjunctivae and EOM are normal. Pupils are equal, round, and reactive to light. Right eye exhibits no discharge. Left eye exhibits no discharge. No scleral icterus.  Neck: Neck supple. No JVD present. No thyromegaly present.  Cardiovascular: Normal rate, regular rhythm, normal heart sounds and intact distal pulses.   No murmur heard. Pulmonary/Chest: Effort normal and breath sounds normal. No respiratory distress. She has no wheezes. She has no rales. She exhibits no tenderness.  Breasts normal female  Abdominal: Soft. Bowel sounds are normal. She exhibits no distension and no mass. There is no tenderness. There is no rebound and no guarding.  Genitourinary:  Deferred to GYN physician  Musculoskeletal: She exhibits no edema.  Lymphadenopathy:    She has no cervical adenopathy.  Neurological: She is alert and oriented to person, place, and time. She has normal reflexes. She displays normal reflexes. No cranial nerve deficit. Coordination normal.  Skin: Skin is warm and dry. No rash noted. She is not diaphoretic.  Psychiatric: She has a normal mood and affect. Her behavior is normal. Judgment and thought content normal.  Assessment & Plan:  Hypothyroidism  Hypertension-stable  Obesity-long-standing and unable to lose weight. Unable to exercise very much due to orthopedic issues.  History of osteopenia  History of dependent edema  History of migraine headaches but none recently  Remote history  of pulmonary emboli ask for knee replacement surgery  History of onychomycosis  Elevated LDL cholesterol of 145. Total cholesterol 219.  Amputation of left second toe mass July 2013  History of glucose intolerance  Plan: Continue same medications and return in 6 months. Encouraged diet and exercise. Needs to have followup colonoscopy. Had mammogram November 2014. Has been given prescription had Zostavax vaccine at pharmacy.        Subjective:   Patient presents for Medicare Annual/Subsequent preventive examination.  Review Past Medical/Family/Social: See above   Risk Factors  Current exercise habits: Sedentary Dietary issues discussed: Low-fat low-carb  Cardiac risk factors: Hypertension, obesity  Depression Screen  (Note: if answer to either of the following is "Yes", a more complete depression screening is indicated)   Over the past two weeks, have you felt down, depressed or hopeless? No  Over the past two weeks, have you felt little interest or pleasure in doing things? No Have you lost interest or pleasure in daily life? No Do you often feel hopeless? No Do you cry easily over simple problems? No   Activities of Daily Living  In your present state of health, do you have any difficulty performing the following activities?:   Driving? No  Managing money? No  Feeding yourself? No  Getting from bed to chair? No  Climbing a flight of stairs? No  Preparing food and eating?: No  Bathing or showering? No  Getting dressed: No  Getting to the toilet? No  Using the toilet:No  Moving around from place to place: No  In the past year have you fallen or had a near fall?:No  Are you sexually active? No  Do you have more than one partner? No   Hearing Difficulties: No  Do you often ask people to speak up or repeat themselves? No  Do you experience ringing or noises in your ears? No  Do you have difficulty understanding soft or whispered voices? No  Do you feel that  you have a problem with memory? No Do you often misplace items? No    Home Safety:  Do you have a smoke alarm at your residence? Yes Do you have grab bars in the bathroom? No Do you have throw rugs in your house? No   Cognitive Testing  Alert? Yes Normal Appearance?Yes  Oriented to person? Yes Place? Yes  Time? Yes  Recall of three objects? Yes  Can perform simple calculations? Yes  Displays appropriate judgment?Yes  Can read the correct time from a watch face?Yes   List the Names of Other Physician/Practitioners you currently use:  See referral list for the physicians patient is currently seeing.  GYN physician   Review of Systems: see above   Objective:     General appearance: Appears stated age and mildly obese  Head: Normocephalic, without obvious abnormality, atraumatic  Eyes: conj clear, EOMi PEERLA  Ears: normal TM's and external ear canals both ears  Nose: Nares normal. Septum midline. Mucosa normal. No drainage or sinus tenderness.  Throat: lips, mucosa, and tongue normal; teeth and gums normal  Neck: no adenopathy, no carotid bruit, no JVD, supple, symmetrical, trachea midline and thyroid not enlarged, symmetric, no tenderness/mass/nodules  No CVA tenderness.  Lungs: clear to  auscultation bilaterally  Breasts: normal appearance, no masses or tenderness Heart: regular rate and rhythm, S1, S2 normal, no murmur, click, rub or gallop  Abdomen: soft, non-tender; bowel sounds normal; no masses, no organomegaly  Musculoskeletal: ROM normal in all joints, no crepitus, no deformity, Normal muscle strengthen. Back  is symmetric, no curvature. Skin: Skin color, texture, turgor normal. No rashes or lesions  Lymph nodes: Cervical, supraclavicular, and axillary nodes normal.  Neurologic: CN 2 -12 Normal, Normal symmetric reflexes. Normal coordination and gait  Psych: Alert & Oriented x 3, Mood appear stable.    Assessment:    Annual wellness medicare exam   Plan:     During the course of the visit the patient was educated and counseled about appropriate screening and preventive services including:   Annual mammogram-last November 2014  Followup colonoscopy reminded-last 2007 at [facility]  Annual diabetic eye exam reminded       Patient Instructions (the written plan) was given to the patient.  Medicare Attestation  I have personally reviewed:  The patient's medical and social history  Their use of alcohol, tobacco or illicit drugs  Their current medications and supplements  The patient's functional ability including ADLs,fall risks, home safety risks, cognitive, and hearing and visual impairment  Diet and physical activities  Evidence for depression or mood disorders  The patient's weight, height, BMI, and visual acuity have been recorded in the chart. I have made referrals, counseling, and provided education to the patient based on review of the above and I have provided the patient with a written personalized care plan for preventive services.

## 2013-09-27 ENCOUNTER — Other Ambulatory Visit: Payer: Self-pay | Admitting: Dermatology

## 2013-09-27 DIAGNOSIS — L821 Other seborrheic keratosis: Secondary | ICD-10-CM | POA: Diagnosis not present

## 2013-09-27 DIAGNOSIS — D239 Other benign neoplasm of skin, unspecified: Secondary | ICD-10-CM | POA: Diagnosis not present

## 2013-09-27 DIAGNOSIS — L820 Inflamed seborrheic keratosis: Secondary | ICD-10-CM | POA: Diagnosis not present

## 2013-10-11 DIAGNOSIS — L02619 Cutaneous abscess of unspecified foot: Secondary | ICD-10-CM | POA: Diagnosis not present

## 2013-10-13 ENCOUNTER — Ambulatory Visit: Payer: Medicare Other | Admitting: Podiatrist

## 2013-10-25 ENCOUNTER — Other Ambulatory Visit: Payer: Medicare Other | Admitting: Internal Medicine

## 2013-10-25 ENCOUNTER — Other Ambulatory Visit: Payer: Self-pay | Admitting: Internal Medicine

## 2013-10-25 DIAGNOSIS — E039 Hypothyroidism, unspecified: Secondary | ICD-10-CM

## 2013-10-25 DIAGNOSIS — I10 Essential (primary) hypertension: Secondary | ICD-10-CM | POA: Diagnosis not present

## 2013-10-25 DIAGNOSIS — R7301 Impaired fasting glucose: Secondary | ICD-10-CM | POA: Diagnosis not present

## 2013-10-25 LAB — BASIC METABOLIC PANEL
BUN: 12 mg/dL (ref 6–23)
CHLORIDE: 101 meq/L (ref 96–112)
CO2: 30 mEq/L (ref 19–32)
CREATININE: 0.81 mg/dL (ref 0.50–1.10)
Calcium: 9.9 mg/dL (ref 8.4–10.5)
Glucose, Bld: 96 mg/dL (ref 70–99)
POTASSIUM: 4.1 meq/L (ref 3.5–5.3)
SODIUM: 140 meq/L (ref 135–145)

## 2013-10-25 LAB — TSH: TSH: 1.676 u[IU]/mL (ref 0.350–4.500)

## 2013-10-27 ENCOUNTER — Encounter: Payer: Self-pay | Admitting: Internal Medicine

## 2013-10-27 ENCOUNTER — Ambulatory Visit (INDEPENDENT_AMBULATORY_CARE_PROVIDER_SITE_OTHER): Payer: Medicare Other | Admitting: Internal Medicine

## 2013-10-27 VITALS — BP 130/80 | HR 80 | Temp 98.3°F | Wt 240.0 lb

## 2013-10-27 DIAGNOSIS — M2040 Other hammer toe(s) (acquired), unspecified foot: Secondary | ICD-10-CM

## 2013-10-27 DIAGNOSIS — M21611 Bunion of right foot: Secondary | ICD-10-CM

## 2013-10-27 DIAGNOSIS — E039 Hypothyroidism, unspecified: Secondary | ICD-10-CM | POA: Diagnosis not present

## 2013-10-27 DIAGNOSIS — I10 Essential (primary) hypertension: Secondary | ICD-10-CM

## 2013-10-27 DIAGNOSIS — M21612 Bunion of left foot: Secondary | ICD-10-CM

## 2013-10-27 DIAGNOSIS — E119 Type 2 diabetes mellitus without complications: Secondary | ICD-10-CM

## 2013-10-27 DIAGNOSIS — M2042 Other hammer toe(s) (acquired), left foot: Secondary | ICD-10-CM

## 2013-10-27 DIAGNOSIS — M21619 Bunion of unspecified foot: Secondary | ICD-10-CM | POA: Diagnosis not present

## 2013-10-27 LAB — HEMOGLOBIN A1C
Hgb A1c MFr Bld: 5.8 % — ABNORMAL HIGH (ref <5.7)
Mean Plasma Glucose: 120 mg/dL — ABNORMAL HIGH (ref <117)

## 2013-10-27 NOTE — Patient Instructions (Addendum)
Consider bunion surgery. Return in 6 months. Continue same meds. Diet exercise and weight loss.

## 2013-10-27 NOTE — Progress Notes (Signed)
   Subjective:    Patient ID: Cassidy [name], female    DOB: 17-Jul-1940, 73 y.o.   MRN: [id number]  HPI Six-month recheck of hypertension, hypothyroidism, type 2 diabetes, obesity. Continues to have issues with her feet. Sees orthopedist. Had cyst on plantar aspect of left second toe recently treated by orthopedist. She has bilateral bunions. Feels well for the most part with no new complaints. I think the cause of foot issues she's not able to exercise very much. Needs to consider bunionectomy left foot. Probably doesn't watch her diet as much as she should.  Had eye exam Dr. Carolyn Stare May 2015. Had Pneumovax immunization 2010.  Review of Systems     Objective:   Physical Exam She has bilateral bunions. Healing area where cyst was drained left second toe plantar aspect with hammertoe great toe. Trace lower extremity edema bilaterally. Neck is supple without JVD thyromegaly or carotid bruits. Chest clear to auscultation. Cardiac exam regular rate and rhythm.       Assessment & Plan:  Hypertension-stable on current regimen  Hypothyroidism-TSH is within normal limits on thyroid replacement therapy  Diabetes-hemoglobin A1c within normal limits  Obesity-talk with patient about diet and exercise once again  Bilateral bunions causing issues with her feet. Needs to consider at least left bunionectomy.  Plan: Diet, exercise, and weight loss. Return in 6 months for physical examination.  25 minutes spent with patient

## 2013-11-03 DIAGNOSIS — M25579 Pain in unspecified ankle and joints of unspecified foot: Secondary | ICD-10-CM | POA: Diagnosis not present

## 2013-12-01 ENCOUNTER — Other Ambulatory Visit: Payer: Self-pay | Admitting: Internal Medicine

## 2013-12-12 ENCOUNTER — Other Ambulatory Visit: Payer: Self-pay | Admitting: Dermatology

## 2013-12-12 DIAGNOSIS — C4370 Malignant melanoma of unspecified lower limb, including hip: Secondary | ICD-10-CM | POA: Diagnosis not present

## 2013-12-12 DIAGNOSIS — C4360 Malignant melanoma of unspecified upper limb, including shoulder: Secondary | ICD-10-CM | POA: Diagnosis not present

## 2013-12-12 DIAGNOSIS — D485 Neoplasm of uncertain behavior of skin: Secondary | ICD-10-CM | POA: Diagnosis not present

## 2013-12-12 DIAGNOSIS — L821 Other seborrheic keratosis: Secondary | ICD-10-CM | POA: Diagnosis not present

## 2013-12-12 DIAGNOSIS — D239 Other benign neoplasm of skin, unspecified: Secondary | ICD-10-CM | POA: Diagnosis not present

## 2013-12-26 DIAGNOSIS — C4360 Malignant melanoma of unspecified upper limb, including shoulder: Secondary | ICD-10-CM | POA: Diagnosis not present

## 2013-12-30 DIAGNOSIS — C4360 Malignant melanoma of unspecified upper limb, including shoulder: Secondary | ICD-10-CM | POA: Diagnosis not present

## 2013-12-30 DIAGNOSIS — C4370 Malignant melanoma of unspecified lower limb, including hip: Secondary | ICD-10-CM | POA: Diagnosis not present

## 2013-12-30 DIAGNOSIS — C439 Malignant melanoma of skin, unspecified: Secondary | ICD-10-CM | POA: Diagnosis not present

## 2014-01-04 DIAGNOSIS — E034 Atrophy of thyroid (acquired): Secondary | ICD-10-CM | POA: Insufficient documentation

## 2014-01-09 DIAGNOSIS — D235 Other benign neoplasm of skin of trunk: Secondary | ICD-10-CM | POA: Diagnosis not present

## 2014-01-09 DIAGNOSIS — D2370 Other benign neoplasm of skin of unspecified lower limb, including hip: Secondary | ICD-10-CM | POA: Diagnosis not present

## 2014-01-09 DIAGNOSIS — C4370 Malignant melanoma of unspecified lower limb, including hip: Secondary | ICD-10-CM | POA: Diagnosis not present

## 2014-01-09 DIAGNOSIS — R609 Edema, unspecified: Secondary | ICD-10-CM | POA: Diagnosis not present

## 2014-01-09 DIAGNOSIS — C4360 Malignant melanoma of unspecified upper limb, including shoulder: Secondary | ICD-10-CM | POA: Diagnosis not present

## 2014-01-09 DIAGNOSIS — Z6838 Body mass index (BMI) 38.0-38.9, adult: Secondary | ICD-10-CM | POA: Diagnosis not present

## 2014-01-09 DIAGNOSIS — G43909 Migraine, unspecified, not intractable, without status migrainosus: Secondary | ICD-10-CM | POA: Diagnosis not present

## 2014-01-09 DIAGNOSIS — Z86718 Personal history of other venous thrombosis and embolism: Secondary | ICD-10-CM | POA: Diagnosis not present

## 2014-01-09 DIAGNOSIS — N649 Disorder of breast, unspecified: Secondary | ICD-10-CM | POA: Diagnosis not present

## 2014-01-09 DIAGNOSIS — C439 Malignant melanoma of skin, unspecified: Secondary | ICD-10-CM | POA: Diagnosis not present

## 2014-01-09 DIAGNOSIS — E039 Hypothyroidism, unspecified: Secondary | ICD-10-CM | POA: Diagnosis not present

## 2014-01-09 DIAGNOSIS — I10 Essential (primary) hypertension: Secondary | ICD-10-CM | POA: Diagnosis not present

## 2014-01-20 DIAGNOSIS — C439 Malignant melanoma of skin, unspecified: Secondary | ICD-10-CM | POA: Insufficient documentation

## 2014-01-26 DIAGNOSIS — Z7982 Long term (current) use of aspirin: Secondary | ICD-10-CM | POA: Diagnosis not present

## 2014-01-26 DIAGNOSIS — Z96659 Presence of unspecified artificial knee joint: Secondary | ICD-10-CM | POA: Diagnosis not present

## 2014-01-26 DIAGNOSIS — I10 Essential (primary) hypertension: Secondary | ICD-10-CM | POA: Diagnosis not present

## 2014-01-26 DIAGNOSIS — E039 Hypothyroidism, unspecified: Secondary | ICD-10-CM | POA: Diagnosis not present

## 2014-01-26 DIAGNOSIS — Z6838 Body mass index (BMI) 38.0-38.9, adult: Secondary | ICD-10-CM | POA: Diagnosis not present

## 2014-01-26 DIAGNOSIS — R600 Localized edema: Secondary | ICD-10-CM | POA: Diagnosis not present

## 2014-01-26 DIAGNOSIS — Z90710 Acquired absence of both cervix and uterus: Secondary | ICD-10-CM | POA: Diagnosis not present

## 2014-01-26 DIAGNOSIS — E669 Obesity, unspecified: Secondary | ICD-10-CM | POA: Diagnosis not present

## 2014-01-26 DIAGNOSIS — Z411 Encounter for cosmetic surgery: Secondary | ICD-10-CM | POA: Diagnosis not present

## 2014-01-26 DIAGNOSIS — Z85820 Personal history of malignant melanoma of skin: Secondary | ICD-10-CM | POA: Diagnosis not present

## 2014-01-26 DIAGNOSIS — C439 Malignant melanoma of skin, unspecified: Secondary | ICD-10-CM | POA: Diagnosis not present

## 2014-01-26 DIAGNOSIS — C4371 Malignant melanoma of right lower limb, including hip: Secondary | ICD-10-CM | POA: Diagnosis not present

## 2014-01-26 DIAGNOSIS — Z86711 Personal history of pulmonary embolism: Secondary | ICD-10-CM | POA: Diagnosis not present

## 2014-01-27 DIAGNOSIS — C4371 Malignant melanoma of right lower limb, including hip: Secondary | ICD-10-CM | POA: Diagnosis not present

## 2014-01-28 ENCOUNTER — Other Ambulatory Visit: Payer: Self-pay | Admitting: Internal Medicine

## 2014-02-27 ENCOUNTER — Other Ambulatory Visit: Payer: Self-pay | Admitting: Dermatology

## 2014-02-27 DIAGNOSIS — D0359 Melanoma in situ of other part of trunk: Secondary | ICD-10-CM | POA: Diagnosis not present

## 2014-02-27 DIAGNOSIS — D485 Neoplasm of uncertain behavior of skin: Secondary | ICD-10-CM | POA: Diagnosis not present

## 2014-02-27 DIAGNOSIS — C4371 Malignant melanoma of right lower limb, including hip: Secondary | ICD-10-CM | POA: Diagnosis not present

## 2014-02-27 DIAGNOSIS — D225 Melanocytic nevi of trunk: Secondary | ICD-10-CM | POA: Diagnosis not present

## 2014-02-27 DIAGNOSIS — L821 Other seborrheic keratosis: Secondary | ICD-10-CM | POA: Diagnosis not present

## 2014-02-27 DIAGNOSIS — Z87898 Personal history of other specified conditions: Secondary | ICD-10-CM | POA: Diagnosis not present

## 2014-02-27 DIAGNOSIS — Z85820 Personal history of malignant melanoma of skin: Secondary | ICD-10-CM | POA: Diagnosis not present

## 2014-02-27 DIAGNOSIS — D2271 Melanocytic nevi of right lower limb, including hip: Secondary | ICD-10-CM | POA: Diagnosis not present

## 2014-02-27 DIAGNOSIS — D239 Other benign neoplasm of skin, unspecified: Secondary | ICD-10-CM | POA: Diagnosis not present

## 2014-02-28 DIAGNOSIS — M19042 Primary osteoarthritis, left hand: Secondary | ICD-10-CM | POA: Diagnosis not present

## 2014-03-02 ENCOUNTER — Other Ambulatory Visit: Payer: Self-pay | Admitting: Internal Medicine

## 2014-03-17 DIAGNOSIS — Z85820 Personal history of malignant melanoma of skin: Secondary | ICD-10-CM | POA: Diagnosis not present

## 2014-03-17 DIAGNOSIS — C439 Malignant melanoma of skin, unspecified: Secondary | ICD-10-CM | POA: Diagnosis not present

## 2014-03-17 DIAGNOSIS — C4359 Malignant melanoma of other part of trunk: Secondary | ICD-10-CM | POA: Diagnosis not present

## 2014-03-17 DIAGNOSIS — C4372 Malignant melanoma of left lower limb, including hip: Secondary | ICD-10-CM | POA: Diagnosis not present

## 2014-03-23 DIAGNOSIS — D2272 Melanocytic nevi of left lower limb, including hip: Secondary | ICD-10-CM | POA: Diagnosis not present

## 2014-03-23 DIAGNOSIS — D225 Melanocytic nevi of trunk: Secondary | ICD-10-CM | POA: Diagnosis not present

## 2014-03-23 DIAGNOSIS — C4371 Malignant melanoma of right lower limb, including hip: Secondary | ICD-10-CM | POA: Diagnosis not present

## 2014-04-07 DIAGNOSIS — D225 Melanocytic nevi of trunk: Secondary | ICD-10-CM | POA: Diagnosis not present

## 2014-04-07 DIAGNOSIS — L089 Local infection of the skin and subcutaneous tissue, unspecified: Secondary | ICD-10-CM | POA: Diagnosis not present

## 2014-04-07 DIAGNOSIS — B999 Unspecified infectious disease: Secondary | ICD-10-CM | POA: Diagnosis not present

## 2014-04-07 DIAGNOSIS — C4371 Malignant melanoma of right lower limb, including hip: Secondary | ICD-10-CM | POA: Diagnosis not present

## 2014-04-07 DIAGNOSIS — C439 Malignant melanoma of skin, unspecified: Secondary | ICD-10-CM | POA: Diagnosis not present

## 2014-04-18 DIAGNOSIS — Z049 Encounter for examination and observation for unspecified reason: Secondary | ICD-10-CM | POA: Diagnosis not present

## 2014-04-18 DIAGNOSIS — I10 Essential (primary) hypertension: Secondary | ICD-10-CM | POA: Diagnosis not present

## 2014-04-18 DIAGNOSIS — Z01810 Encounter for preprocedural cardiovascular examination: Secondary | ICD-10-CM | POA: Diagnosis not present

## 2014-04-18 DIAGNOSIS — E039 Hypothyroidism, unspecified: Secondary | ICD-10-CM | POA: Diagnosis not present

## 2014-04-18 DIAGNOSIS — R9431 Abnormal electrocardiogram [ECG] [EKG]: Secondary | ICD-10-CM | POA: Diagnosis not present

## 2014-04-18 DIAGNOSIS — Z79899 Other long term (current) drug therapy: Secondary | ICD-10-CM | POA: Diagnosis not present

## 2014-04-24 ENCOUNTER — Other Ambulatory Visit: Payer: Medicare Other | Admitting: Internal Medicine

## 2014-04-24 DIAGNOSIS — L905 Scar conditions and fibrosis of skin: Secondary | ICD-10-CM | POA: Diagnosis not present

## 2014-04-24 DIAGNOSIS — D2261 Melanocytic nevi of right upper limb, including shoulder: Secondary | ICD-10-CM | POA: Diagnosis not present

## 2014-04-24 DIAGNOSIS — C439 Malignant melanoma of skin, unspecified: Secondary | ICD-10-CM | POA: Diagnosis not present

## 2014-04-24 DIAGNOSIS — R7301 Impaired fasting glucose: Secondary | ICD-10-CM | POA: Diagnosis not present

## 2014-04-24 DIAGNOSIS — R238 Other skin changes: Secondary | ICD-10-CM | POA: Diagnosis not present

## 2014-04-24 DIAGNOSIS — E039 Hypothyroidism, unspecified: Secondary | ICD-10-CM | POA: Diagnosis not present

## 2014-04-24 DIAGNOSIS — C4371 Malignant melanoma of right lower limb, including hip: Secondary | ICD-10-CM | POA: Diagnosis not present

## 2014-04-24 DIAGNOSIS — Z85820 Personal history of malignant melanoma of skin: Secondary | ICD-10-CM | POA: Diagnosis not present

## 2014-04-24 DIAGNOSIS — I10 Essential (primary) hypertension: Secondary | ICD-10-CM | POA: Diagnosis not present

## 2014-04-24 DIAGNOSIS — D2111 Benign neoplasm of connective and other soft tissue of right upper limb, including shoulder: Secondary | ICD-10-CM | POA: Diagnosis not present

## 2014-04-25 ENCOUNTER — Encounter: Payer: Medicare Other | Admitting: Internal Medicine

## 2014-05-02 DIAGNOSIS — Z85820 Personal history of malignant melanoma of skin: Secondary | ICD-10-CM | POA: Diagnosis not present

## 2014-05-19 DIAGNOSIS — Z4802 Encounter for removal of sutures: Secondary | ICD-10-CM | POA: Diagnosis not present

## 2014-05-19 DIAGNOSIS — C4371 Malignant melanoma of right lower limb, including hip: Secondary | ICD-10-CM | POA: Diagnosis not present

## 2014-05-31 ENCOUNTER — Other Ambulatory Visit: Payer: Self-pay | Admitting: Dermatology

## 2014-05-31 DIAGNOSIS — D225 Melanocytic nevi of trunk: Secondary | ICD-10-CM | POA: Diagnosis not present

## 2014-05-31 DIAGNOSIS — Z87898 Personal history of other specified conditions: Secondary | ICD-10-CM | POA: Diagnosis not present

## 2014-05-31 DIAGNOSIS — D2272 Melanocytic nevi of left lower limb, including hip: Secondary | ICD-10-CM | POA: Diagnosis not present

## 2014-05-31 DIAGNOSIS — D239 Other benign neoplasm of skin, unspecified: Secondary | ICD-10-CM | POA: Diagnosis not present

## 2014-05-31 DIAGNOSIS — D485 Neoplasm of uncertain behavior of skin: Secondary | ICD-10-CM | POA: Diagnosis not present

## 2014-05-31 DIAGNOSIS — Z86018 Personal history of other benign neoplasm: Secondary | ICD-10-CM | POA: Diagnosis not present

## 2014-05-31 DIAGNOSIS — D2271 Melanocytic nevi of right lower limb, including hip: Secondary | ICD-10-CM | POA: Diagnosis not present

## 2014-05-31 DIAGNOSIS — Z85820 Personal history of malignant melanoma of skin: Secondary | ICD-10-CM | POA: Diagnosis not present

## 2014-05-31 DIAGNOSIS — L821 Other seborrheic keratosis: Secondary | ICD-10-CM | POA: Diagnosis not present

## 2014-06-06 ENCOUNTER — Other Ambulatory Visit: Payer: Self-pay | Admitting: Internal Medicine

## 2014-06-19 ENCOUNTER — Other Ambulatory Visit: Payer: Medicare Other | Admitting: Internal Medicine

## 2014-06-19 DIAGNOSIS — R7302 Impaired glucose tolerance (oral): Secondary | ICD-10-CM | POA: Diagnosis not present

## 2014-06-19 DIAGNOSIS — E559 Vitamin D deficiency, unspecified: Secondary | ICD-10-CM

## 2014-06-19 DIAGNOSIS — Z79899 Other long term (current) drug therapy: Secondary | ICD-10-CM | POA: Diagnosis not present

## 2014-06-19 DIAGNOSIS — E039 Hypothyroidism, unspecified: Secondary | ICD-10-CM | POA: Diagnosis not present

## 2014-06-19 DIAGNOSIS — Z136 Encounter for screening for cardiovascular disorders: Secondary | ICD-10-CM | POA: Diagnosis not present

## 2014-06-19 DIAGNOSIS — IMO0001 Reserved for inherently not codable concepts without codable children: Secondary | ICD-10-CM

## 2014-06-19 DIAGNOSIS — R5383 Other fatigue: Secondary | ICD-10-CM

## 2014-06-19 DIAGNOSIS — Z13220 Encounter for screening for lipoid disorders: Secondary | ICD-10-CM | POA: Diagnosis not present

## 2014-06-19 LAB — LIPID PANEL
CHOLESTEROL: 203 mg/dL — AB (ref 0–200)
HDL: 47 mg/dL (ref >=46)
LDL Cholesterol: 131 mg/dL — ABNORMAL HIGH (ref 0–99)
Total CHOL/HDL Ratio: 4.3 Ratio
Triglycerides: 127 mg/dL (ref <150)
VLDL: 25 mg/dL (ref 0–40)

## 2014-06-19 LAB — HEMOGLOBIN A1C
Hgb A1c MFr Bld: 5.8 % — ABNORMAL HIGH (ref <5.7)
MEAN PLASMA GLUCOSE: 120 mg/dL — AB (ref <117)

## 2014-06-19 LAB — CBC WITH DIFFERENTIAL/PLATELET
BASOS ABS: 0.1 10*3/uL (ref 0.0–0.1)
BASOS PCT: 1 % (ref 0–1)
EOS ABS: 0.2 10*3/uL (ref 0.0–0.7)
Eosinophils Relative: 4 % (ref 0–5)
HCT: 43 % (ref 36.0–46.0)
Hemoglobin: 13.9 g/dL (ref 12.0–15.0)
Lymphocytes Relative: 35 % (ref 12–46)
Lymphs Abs: 1.9 10*3/uL (ref 0.7–4.0)
MCH: 29 pg (ref 26.0–34.0)
MCHC: 32.3 g/dL (ref 30.0–36.0)
MCV: 89.6 fL (ref 78.0–100.0)
MPV: 10.4 fL (ref 8.6–12.4)
Monocytes Absolute: 0.4 10*3/uL (ref 0.1–1.0)
Monocytes Relative: 8 % (ref 3–12)
NEUTROS ABS: 2.8 10*3/uL (ref 1.7–7.7)
NEUTROS PCT: 52 % (ref 43–77)
PLATELETS: 251 10*3/uL (ref 150–400)
RBC: 4.8 MIL/uL (ref 3.87–5.11)
RDW: 15.4 % (ref 11.5–15.5)
WBC: 5.3 10*3/uL (ref 4.0–10.5)

## 2014-06-19 LAB — COMPREHENSIVE METABOLIC PANEL
ALK PHOS: 88 U/L (ref 39–117)
ALT: 16 U/L (ref 0–35)
AST: 20 U/L (ref 0–37)
Albumin: 4.3 g/dL (ref 3.5–5.2)
BILIRUBIN TOTAL: 0.7 mg/dL (ref 0.2–1.2)
BUN: 14 mg/dL (ref 6–23)
CHLORIDE: 103 meq/L (ref 96–112)
CO2: 30 mEq/L (ref 19–32)
CREATININE: 0.73 mg/dL (ref 0.50–1.10)
Calcium: 9.9 mg/dL (ref 8.4–10.5)
Glucose, Bld: 90 mg/dL (ref 70–99)
POTASSIUM: 4.6 meq/L (ref 3.5–5.3)
Sodium: 142 mEq/L (ref 135–145)
Total Protein: 7 g/dL (ref 6.0–8.3)

## 2014-06-19 LAB — TSH: TSH: 0.956 u[IU]/mL (ref 0.350–4.500)

## 2014-06-20 ENCOUNTER — Telehealth: Payer: Self-pay | Admitting: *Deleted

## 2014-06-20 ENCOUNTER — Ambulatory Visit (HOSPITAL_COMMUNITY)
Admission: RE | Admit: 2014-06-20 | Discharge: 2014-06-20 | Disposition: A | Discharge status: Home / Self Care | Payer: Medicare Other | Source: Ambulatory Visit | Attending: Internal Medicine | Admitting: Internal Medicine

## 2014-06-20 ENCOUNTER — Encounter: Payer: Self-pay | Admitting: Internal Medicine

## 2014-06-20 ENCOUNTER — Ambulatory Visit (INDEPENDENT_AMBULATORY_CARE_PROVIDER_SITE_OTHER): Payer: Medicare Other | Admitting: Internal Medicine

## 2014-06-20 VITALS — BP 146/80 | HR 97 | Temp 97.6°F | Wt 231.0 lb

## 2014-06-20 DIAGNOSIS — M79609 Pain in unspecified limb: Secondary | ICD-10-CM | POA: Diagnosis not present

## 2014-06-20 DIAGNOSIS — E039 Hypothyroidism, unspecified: Secondary | ICD-10-CM | POA: Diagnosis not present

## 2014-06-20 DIAGNOSIS — E785 Hyperlipidemia, unspecified: Secondary | ICD-10-CM

## 2014-06-20 DIAGNOSIS — I10 Essential (primary) hypertension: Secondary | ICD-10-CM

## 2014-06-20 DIAGNOSIS — E669 Obesity, unspecified: Secondary | ICD-10-CM | POA: Diagnosis not present

## 2014-06-20 DIAGNOSIS — L03115 Cellulitis of right lower limb: Secondary | ICD-10-CM | POA: Insufficient documentation

## 2014-06-20 DIAGNOSIS — IMO0001 Reserved for inherently not codable concepts without codable children: Secondary | ICD-10-CM

## 2014-06-20 DIAGNOSIS — Z86711 Personal history of pulmonary embolism: Secondary | ICD-10-CM | POA: Diagnosis not present

## 2014-06-20 DIAGNOSIS — E7800 Pure hypercholesterolemia, unspecified: Secondary | ICD-10-CM

## 2014-06-20 DIAGNOSIS — Z0000 Encounter for general adult medical examination without abnormal findings: Secondary | ICD-10-CM

## 2014-06-20 DIAGNOSIS — T814XXA Infection following a procedure, initial encounter: Secondary | ICD-10-CM | POA: Diagnosis not present

## 2014-06-20 DIAGNOSIS — E8881 Metabolic syndrome: Secondary | ICD-10-CM | POA: Diagnosis not present

## 2014-06-20 DIAGNOSIS — E119 Type 2 diabetes mellitus without complications: Secondary | ICD-10-CM

## 2014-06-20 LAB — VITAMIN D 25 HYDROXY (VIT D DEFICIENCY, FRACTURES): Vit D, 25-Hydroxy: 29 ng/mL — ABNORMAL LOW (ref 30–100)

## 2014-06-20 MED ORDER — DOXYCYCLINE HYCLATE 100 MG PO TABS: 100.0000 mg | ORAL_TABLET | Freq: Two times a day (BID) | ORAL | Status: DC

## 2014-06-20 MED ORDER — CEFTRIAXONE SODIUM 1 G IJ SOLR
1.0000 g | Freq: Once | INTRAMUSCULAR | Status: AC
  Administered 2014-06-20: 1 g via INTRAMUSCULAR

## 2014-06-20 MED ORDER — MUPIROCIN 2 % EX OINT: TOPICAL_OINTMENT | CUTANEOUS | Status: DC

## 2014-06-20 NOTE — Telephone Encounter (Signed)
Spoke with Dr Luetta Nutting office they will see patient March 4 at 11:00 for surgical wound infection

## 2014-06-20 NOTE — Patient Instructions (Addendum)
Have Doppler study of the right lower extremity today. Clean lesions would peroxide and apply Bactroban ointment twice daily. Take doxycycline 100 mg twice daily for 10 days. Appointment at [facility] for Friday, March 4 for follow-up of infected leg and foot

## 2014-06-20 NOTE — Progress Notes (Signed)
Subjective:    Patient ID: Cassidy [name], female    DOB: Nov 17, 1940, 74 y.o.   MRN: [id number]  HPI 74 year old White Female in today for health maintenance exam and evaluation of medical problems.  Today she has a significant problem in that her right lower extremity is very swollen and edematous. She has had several dermatology procedures on her right lower extremity recently since Fall of 2015. She also had a lymph node dissection in her right groin.  We have records indicating that she had resection of a melanoma right ankle 01/09/2014 by Dr. Loetta Rough at [facility]. Later had a wide excision of the right lateral malleolus with Integra placement 01/26/2014. Had lesion removed from right lateral thigh. Right calf had a dysplastic nevus. Right shoulder and right arm had melanomas in situ.  Unfortunately she says she wore tight shoe this past Saturday, March 27 and now has apparent cellulitis top of right foot. Right lateral malleolus appears to be secondarily infected as well as right calf where lesion was removed.  Patient has history of diabetes, obesity, metabolic syndrome, hypertension, hypothyroidism, history of migraine headaches. She has GYN physician.  In 2013 she had amputation of left second toe due to a benign mass by Dr. Mayer Camel. History of onychomycosis. History of right knee replacement July 2010. Subsequently developed bilateral pulmonary emboli after right knee replacement surgery in 2010. She's had arthroscopic surgery for torn ligaments right knee June 2003. Cholecystectomy 2003. Right ganglion cyst surgery 2008. Left ankle surgery 2005 involving a bone graft.  Had colonoscopy done in 2007 at [facility]. Previously had one in [facility] by Dr. Collene Mares September 2003.  Tetanus immunization done in May 2012. Pneumovax immunization 2010. Has been given prescription to have Zostavax vaccine and pharmacy in the past.  History of  urinary tract infections but these have improved status post GYN surgery in 2009.  She did have Cardiolite study done at [facility] 2009 which was normal.  Social history: Patient is divorced. She resides alone. Does not smoke. Social alcohol consumption. 2 adult children. Daughter with history of breast cancer doing well. 3 grandchildren.  Family history: Father died at age 48 of a sudden MI. Mother died at age 70 of heart problems. One brother killed in an airplane crash at age 98. 2 sisters in good health.          Review of Systems  Constitutional: Negative.   HENT: Negative.   Respiratory: Negative.   Cardiovascular: Negative.   Gastrointestinal: Negative.   Genitourinary: Negative.   Skin:       See history of present illness regarding right leg and top of foot  Psychiatric/Behavioral: Negative.        Objective:   Physical Exam  Constitutional: She is oriented to person, place, and time. She appears well-developed and well-nourished. No distress.  HENT:  Head: Normocephalic and atraumatic.  Right Ear: External ear normal.  Left Ear: External ear normal.  Mouth/Throat: Oropharynx is clear and moist. No oropharyngeal exudate.  Eyes: Conjunctivae and EOM are normal. Pupils are equal, round, and reactive to light. Right eye exhibits no discharge. Left eye exhibits no discharge. No scleral icterus.  Neck: Neck supple. No JVD present. No thyromegaly present.  Cardiovascular: Normal rate, regular rhythm, normal heart sounds and intact distal pulses.   No murmur heard. Pulmonary/Chest: Effort normal and breath sounds normal. She has no wheezes. She has no rales.  Abdominal: Soft. Bowel sounds are normal.  She exhibits no distension and no mass. There is no tenderness. There is no rebound and no guarding.  Musculoskeletal: Normal range of motion. She exhibits edema.  Right lower extremity edema  Lymphadenopathy:    She has no cervical adenopathy.  Neurological: She is alert  and oriented to person, place, and time. She has normal reflexes. No cranial nerve deficit. Coordination normal.  Skin: Skin is warm and dry. No rash noted. She is not diaphoretic.  Top of foot has a 5 x 6 cm erythematous lesion with central abraded area. There is a 2.5 x 3 lesion right ankle that appears to be secondarily infected. Also similar lesion back of right calf  Psychiatric: She has a normal mood and affect. Her behavior is normal. Judgment and thought content normal.  Vitals reviewed.         Assessment & Plan:  Cellulitis top of right foot  Infected right lateral malleolus lesion  Infected right calf lesion  Edematous right lower extremity-rule out DVT  Hypertension-stable  Hyperlipidemia-LDL cholesterol remains stable. Consider low-dose statin therapy at next visit.  Type 2 diabetes mellitus-hemoglobin A1c stable and acceptable at 5.8%  Hypothyroidism-TSH within normal limits on current dose of thyroid replacement  Obesity-encouraged diet exercise and weight loss  Plan: To obtain Doppler of the right lower extremity. Rocephin 1 g IM. Doxycycline 100 mg twice daily for 10 days. Appointment immediately to be seen at [facility] for follow-up on Friday, March 4.  Addendum: Doppler study is negative. Clean wounds with peroxide followed by Bactroban ointment.  Subjective:   Patient presents for Medicare Annual/Subsequent preventive examination.  Review Past Medical/Family/Social: see above   Risk Factors  Current exercise habits: sedentary  Dietary issues discussed: Low fat low carb  Cardiac risk factors:Family hx, DM  Depression Screen  (Note: if answer to either of the following is "Yes", a more complete depression screening is indicated)   Over the past two weeks, have you felt down, depressed or hopeless? No  Over the past two weeks, have you felt little interest or pleasure in doing things? No Have you lost interest or pleasure in  daily life? No Do you often feel hopeless? No Do you cry easily over simple problems? No   Activities of Daily Living  In your present state of health, do you have any difficulty performing the following activities?:   Driving? No  Managing money? No  Feeding yourself? No  Getting from bed to chair? No  Climbing a flight of stairs? No  Preparing food and eating?: No  Bathing or showering? No  Getting dressed: No  Getting to the toilet? No  Using the toilet:No  Moving around from place to place: No  In the past year have you fallen or had a near fall?:No  Are you sexually active? No  Do you have more than one partner? No   Hearing Difficulties: No  Do you often ask people to speak up or repeat themselves? No  Do you experience ringing or noises in your ears? No  Do you have difficulty understanding soft or whispered voices? No  Do you feel that you have a problem with memory? No Do you often misplace items? No    Home Safety:  Do you have a smoke alarm at your residence? Yes Do you have grab bars in the bathroom? no Do you have throw rugs in your house? yes   Cognitive Testing  Alert? Yes Normal Appearance?Yes  Oriented to  person? Yes Place? Yes  Time? Yes  Recall of three objects? Yes  Can perform simple calculations? Yes  Displays appropriate judgment?Yes  Can read the correct time from a watch face?Yes   List the Names of Other Physician/Practitioners you currently use:  See referral list for the physicians patient is currently seeing.  [facility]   Review of Systems: see above   Objective:     General appearance: Appears stated age and  obese  Head: Normocephalic, without obvious abnormality, atraumatic  Eyes: conj clear, EOMi PEERLA  Ears: normal TM's and external ear canals both ears  Nose: Nares normal. Septum midline. Mucosa normal. No drainage or sinus tenderness.  Throat: lips, mucosa, and tongue normal; teeth and gums normal  Neck: no adenopathy, no  carotid bruit, no JVD, supple, symmetrical, trachea midline and thyroid not enlarged, symmetric, no tenderness/mass/nodules  No CVA tenderness.  Lungs: clear to auscultation bilaterally  Breasts: normal appearance, no masses or tenderness, top of the pacemaker on left upper chest. Incision well-healed. It is tender.  Heart: regular rate and rhythm, S1, S2 normal, no murmur, click, rub or gallop  Abdomen: soft, non-tender; bowel sounds normal; no masses, no organomegaly  Musculoskeletal: ROM normal in all joints, no crepitus, no deformity, Normal muscle strengthen. Back  is symmetric, no curvature. Skin: Skin color, texture, turgor normal. See above descriptions of lesions right foot and leg  Lymph nodes: Cervical, supraclavicular, and axillary nodes normal.  Neurologic: CN 2 -12 Normal, Normal symmetric reflexes. Normal coordination and gait  Psych: Alert & Oriented x 3, Mood appear stable.    Assessment:    Annual wellness medicare exam   Plan:    During the course of the visit the patient was educated and counseled about appropriate screening and preventive services including:   Mammogram     Patient Instructions (the written plan) was given to the patient.  Medicare Attestation  I have personally reviewed:  The patient's medical and social history  Their use of alcohol, tobacco or illicit drugs  Their current medications and supplements  The patient's functional ability including ADLs,fall risks, home safety risks, cognitive, and hearing and visual impairment  Diet and physical activities  Evidence for depression or mood disorders  The patient's weight, height, BMI, and visual acuity have been recorded in the chart. I have made referrals, counseling, and provided education to the patient based on review of the above and I have provided the patient with a written personalized care plan for preventive services.

## 2014-06-20 NOTE — Progress Notes (Signed)
Right lower extremity venous duplex completed.  Right:  No evidence of DVT, superficial thrombosis, or Baker's cyst.  Left:  Negative for DVT in the common femoral vein.  

## 2014-06-20 NOTE — Telephone Encounter (Signed)
Patient given appt information for her appt with Dr Luetta Nutting

## 2014-06-23 DIAGNOSIS — T8189XA Other complications of procedures, not elsewhere classified, initial encounter: Secondary | ICD-10-CM | POA: Diagnosis not present

## 2014-06-23 DIAGNOSIS — S91001A Unspecified open wound, right ankle, initial encounter: Secondary | ICD-10-CM | POA: Diagnosis not present

## 2014-06-28 DIAGNOSIS — L97912 Non-pressure chronic ulcer of unspecified part of right lower leg with fat layer exposed: Secondary | ICD-10-CM | POA: Diagnosis not present

## 2014-06-28 DIAGNOSIS — I10 Essential (primary) hypertension: Secondary | ICD-10-CM | POA: Diagnosis not present

## 2014-06-28 DIAGNOSIS — I87303 Chronic venous hypertension (idiopathic) without complications of bilateral lower extremity: Secondary | ICD-10-CM | POA: Insufficient documentation

## 2014-06-28 DIAGNOSIS — I890 Lymphedema, not elsewhere classified: Secondary | ICD-10-CM | POA: Diagnosis not present

## 2014-06-28 DIAGNOSIS — Z6841 Body Mass Index (BMI) 40.0 and over, adult: Secondary | ICD-10-CM | POA: Diagnosis not present

## 2014-06-28 DIAGNOSIS — I872 Venous insufficiency (chronic) (peripheral): Secondary | ICD-10-CM | POA: Diagnosis not present

## 2014-06-28 DIAGNOSIS — L97512 Non-pressure chronic ulcer of other part of right foot with fat layer exposed: Secondary | ICD-10-CM | POA: Diagnosis not present

## 2014-06-28 DIAGNOSIS — E669 Obesity, unspecified: Secondary | ICD-10-CM | POA: Diagnosis not present

## 2014-07-04 DIAGNOSIS — I87303 Chronic venous hypertension (idiopathic) without complications of bilateral lower extremity: Secondary | ICD-10-CM | POA: Diagnosis not present

## 2014-07-04 DIAGNOSIS — L97912 Non-pressure chronic ulcer of unspecified part of right lower leg with fat layer exposed: Secondary | ICD-10-CM | POA: Diagnosis not present

## 2014-07-04 DIAGNOSIS — S91001D Unspecified open wound, right ankle, subsequent encounter: Secondary | ICD-10-CM | POA: Diagnosis not present

## 2014-07-04 DIAGNOSIS — I890 Lymphedema, not elsewhere classified: Secondary | ICD-10-CM | POA: Diagnosis not present

## 2014-07-05 DIAGNOSIS — E669 Obesity, unspecified: Secondary | ICD-10-CM | POA: Diagnosis not present

## 2014-07-05 DIAGNOSIS — I87303 Chronic venous hypertension (idiopathic) without complications of bilateral lower extremity: Secondary | ICD-10-CM | POA: Diagnosis not present

## 2014-07-05 DIAGNOSIS — I872 Venous insufficiency (chronic) (peripheral): Secondary | ICD-10-CM | POA: Insufficient documentation

## 2014-07-05 DIAGNOSIS — I890 Lymphedema, not elsewhere classified: Secondary | ICD-10-CM | POA: Diagnosis not present

## 2014-07-05 DIAGNOSIS — I10 Essential (primary) hypertension: Secondary | ICD-10-CM | POA: Diagnosis not present

## 2014-07-05 DIAGNOSIS — L97912 Non-pressure chronic ulcer of unspecified part of right lower leg with fat layer exposed: Secondary | ICD-10-CM | POA: Diagnosis not present

## 2014-07-06 ENCOUNTER — Other Ambulatory Visit: Payer: Self-pay | Admitting: Dermatology

## 2014-07-06 DIAGNOSIS — L089 Local infection of the skin and subcutaneous tissue, unspecified: Secondary | ICD-10-CM | POA: Diagnosis not present

## 2014-07-06 DIAGNOSIS — D2272 Melanocytic nevi of left lower limb, including hip: Secondary | ICD-10-CM | POA: Diagnosis not present

## 2014-07-10 DIAGNOSIS — L97912 Non-pressure chronic ulcer of unspecified part of right lower leg with fat layer exposed: Secondary | ICD-10-CM | POA: Diagnosis not present

## 2014-07-12 DIAGNOSIS — E669 Obesity, unspecified: Secondary | ICD-10-CM | POA: Diagnosis not present

## 2014-07-12 DIAGNOSIS — L97512 Non-pressure chronic ulcer of other part of right foot with fat layer exposed: Secondary | ICD-10-CM | POA: Diagnosis not present

## 2014-07-12 DIAGNOSIS — I872 Venous insufficiency (chronic) (peripheral): Secondary | ICD-10-CM | POA: Diagnosis not present

## 2014-07-12 DIAGNOSIS — I10 Essential (primary) hypertension: Secondary | ICD-10-CM | POA: Diagnosis not present

## 2014-07-12 DIAGNOSIS — L97912 Non-pressure chronic ulcer of unspecified part of right lower leg with fat layer exposed: Secondary | ICD-10-CM | POA: Diagnosis not present

## 2014-07-12 DIAGNOSIS — I890 Lymphedema, not elsewhere classified: Secondary | ICD-10-CM | POA: Diagnosis not present

## 2014-07-12 DIAGNOSIS — I87303 Chronic venous hypertension (idiopathic) without complications of bilateral lower extremity: Secondary | ICD-10-CM | POA: Diagnosis not present

## 2014-07-17 DIAGNOSIS — L97912 Non-pressure chronic ulcer of unspecified part of right lower leg with fat layer exposed: Secondary | ICD-10-CM | POA: Diagnosis not present

## 2014-07-19 DIAGNOSIS — I87303 Chronic venous hypertension (idiopathic) without complications of bilateral lower extremity: Secondary | ICD-10-CM | POA: Diagnosis not present

## 2014-07-19 DIAGNOSIS — I872 Venous insufficiency (chronic) (peripheral): Secondary | ICD-10-CM | POA: Diagnosis not present

## 2014-07-19 DIAGNOSIS — L97912 Non-pressure chronic ulcer of unspecified part of right lower leg with fat layer exposed: Secondary | ICD-10-CM | POA: Diagnosis not present

## 2014-07-19 DIAGNOSIS — I890 Lymphedema, not elsewhere classified: Secondary | ICD-10-CM | POA: Diagnosis not present

## 2014-07-26 DIAGNOSIS — E669 Obesity, unspecified: Secondary | ICD-10-CM | POA: Diagnosis not present

## 2014-07-26 DIAGNOSIS — I872 Venous insufficiency (chronic) (peripheral): Secondary | ICD-10-CM | POA: Diagnosis not present

## 2014-07-26 DIAGNOSIS — I87303 Chronic venous hypertension (idiopathic) without complications of bilateral lower extremity: Secondary | ICD-10-CM | POA: Diagnosis not present

## 2014-07-26 DIAGNOSIS — R7302 Impaired glucose tolerance (oral): Secondary | ICD-10-CM | POA: Diagnosis not present

## 2014-07-26 DIAGNOSIS — L97912 Non-pressure chronic ulcer of unspecified part of right lower leg with fat layer exposed: Secondary | ICD-10-CM | POA: Diagnosis not present

## 2014-07-26 DIAGNOSIS — I890 Lymphedema, not elsewhere classified: Secondary | ICD-10-CM | POA: Diagnosis not present

## 2014-07-27 ENCOUNTER — Encounter: Payer: Self-pay | Admitting: Podiatrist

## 2014-07-27 ENCOUNTER — Ambulatory Visit (INDEPENDENT_AMBULATORY_CARE_PROVIDER_SITE_OTHER): Payer: Medicare Other | Admitting: Podiatrist

## 2014-07-27 DIAGNOSIS — Q828 Other specified congenital malformations of skin: Secondary | ICD-10-CM | POA: Diagnosis not present

## 2014-07-27 DIAGNOSIS — M2031 Hallux varus (acquired), right foot: Secondary | ICD-10-CM

## 2014-07-27 DIAGNOSIS — M2041 Other hammer toe(s) (acquired), right foot: Secondary | ICD-10-CM

## 2014-07-27 DIAGNOSIS — G629 Polyneuropathy, unspecified: Secondary | ICD-10-CM

## 2014-08-01 DIAGNOSIS — E669 Obesity, unspecified: Secondary | ICD-10-CM | POA: Diagnosis not present

## 2014-08-01 DIAGNOSIS — L97912 Non-pressure chronic ulcer of unspecified part of right lower leg with fat layer exposed: Secondary | ICD-10-CM | POA: Diagnosis not present

## 2014-08-01 DIAGNOSIS — I10 Essential (primary) hypertension: Secondary | ICD-10-CM | POA: Diagnosis not present

## 2014-08-01 DIAGNOSIS — I872 Venous insufficiency (chronic) (peripheral): Secondary | ICD-10-CM | POA: Diagnosis not present

## 2014-08-01 DIAGNOSIS — I890 Lymphedema, not elsewhere classified: Secondary | ICD-10-CM | POA: Diagnosis not present

## 2014-08-01 DIAGNOSIS — R7301 Impaired fasting glucose: Secondary | ICD-10-CM | POA: Diagnosis not present

## 2014-08-08 DIAGNOSIS — L97912 Non-pressure chronic ulcer of unspecified part of right lower leg with fat layer exposed: Secondary | ICD-10-CM | POA: Diagnosis not present

## 2014-08-08 DIAGNOSIS — I87303 Chronic venous hypertension (idiopathic) without complications of bilateral lower extremity: Secondary | ICD-10-CM | POA: Diagnosis not present

## 2014-08-08 DIAGNOSIS — I890 Lymphedema, not elsewhere classified: Secondary | ICD-10-CM | POA: Diagnosis not present

## 2014-08-10 ENCOUNTER — Other Ambulatory Visit: Payer: Self-pay | Admitting: Internal Medicine

## 2014-08-16 DIAGNOSIS — L97912 Non-pressure chronic ulcer of unspecified part of right lower leg with fat layer exposed: Secondary | ICD-10-CM | POA: Diagnosis not present

## 2014-08-16 DIAGNOSIS — E669 Obesity, unspecified: Secondary | ICD-10-CM | POA: Diagnosis not present

## 2014-08-16 DIAGNOSIS — Z85820 Personal history of malignant melanoma of skin: Secondary | ICD-10-CM | POA: Diagnosis not present

## 2014-08-16 DIAGNOSIS — I87303 Chronic venous hypertension (idiopathic) without complications of bilateral lower extremity: Secondary | ICD-10-CM | POA: Diagnosis not present

## 2014-08-16 DIAGNOSIS — R7301 Impaired fasting glucose: Secondary | ICD-10-CM | POA: Diagnosis not present

## 2014-08-16 DIAGNOSIS — I872 Venous insufficiency (chronic) (peripheral): Secondary | ICD-10-CM | POA: Diagnosis not present

## 2014-08-16 DIAGNOSIS — I10 Essential (primary) hypertension: Secondary | ICD-10-CM | POA: Diagnosis not present

## 2014-08-16 DIAGNOSIS — I890 Lymphedema, not elsewhere classified: Secondary | ICD-10-CM | POA: Diagnosis not present

## 2014-08-18 NOTE — Progress Notes (Signed)
   Chief Complaint  Patient presents with  . Callouses    "Callous trim,great toe and look behind amputated toe to check for any new callouses     HPI: Patient is 74 y.o. female who presents today for foot and nail care.  She has neuropathy and had an ulcer at the tip of the toe that became infected.  She saw her surgeon and he amputated the toe.  She is doing well s/p toe amputation. She has several calluses she needs trimmed and requests a nail trim as well.  Overall she states she is doing well.  She had several melanomas removed and is doing well from the surgery.     Physical Exam GENERAL APPEARANCE: Alert, conversant. Appropriately groomed. No acute distress.  VASCULAR: Pedal pulses palpable at 2/4 DP and PT bilateral.  Capillary refill time is immediate to all digits,  Proximal to distal cooling it warm to warm.  Digital hair growth is present bilateral  NEUROLOGIC: sensation is decreased epicritically and protectively to 5.07 monofilament at 0/5 sites bilateral.  Light touch is decreased bilateral, vibratory sensation decreased bilateral MUSCULOSKELETAL: large hallux abducto valgus deformity is present left greater than right.  Amputation of the left 2nd toe is noted.  Digital contractures are present bilateral.  Dorsiflexed right great toe at the ipjoint is noted.   DERMATOLOGIC: pre-ulcerative lesion is present plantar right hallux-- it appears there was a blood blister present that went on to callus over and now once debrided, intact integument is present.  Its location and the pressure put on the toe puts it at risk for ulceration.  Hyperkeratotic lesion also present medial side of the first metatarsal head left, submet 4 left.  All toenails are thick, discolored, dystrophic and mycotic.    Assessment: neuropathy, mycotic toenails, pre ulcerative calluses bilateral feet  Plan: Debridement of toenails and calluses was accomplished today without complication.  She will be seen back for  routine care every 3 months or at her request.

## 2014-08-23 DIAGNOSIS — I87303 Chronic venous hypertension (idiopathic) without complications of bilateral lower extremity: Secondary | ICD-10-CM | POA: Diagnosis not present

## 2014-08-23 DIAGNOSIS — L97312 Non-pressure chronic ulcer of right ankle with fat layer exposed: Secondary | ICD-10-CM | POA: Diagnosis not present

## 2014-08-23 DIAGNOSIS — I872 Venous insufficiency (chronic) (peripheral): Secondary | ICD-10-CM | POA: Diagnosis not present

## 2014-08-23 DIAGNOSIS — I890 Lymphedema, not elsewhere classified: Secondary | ICD-10-CM | POA: Diagnosis not present

## 2014-08-30 DIAGNOSIS — Z87898 Personal history of other specified conditions: Secondary | ICD-10-CM | POA: Diagnosis not present

## 2014-08-30 DIAGNOSIS — L821 Other seborrheic keratosis: Secondary | ICD-10-CM | POA: Diagnosis not present

## 2014-08-30 DIAGNOSIS — Z85820 Personal history of malignant melanoma of skin: Secondary | ICD-10-CM | POA: Diagnosis not present

## 2014-08-30 DIAGNOSIS — D225 Melanocytic nevi of trunk: Secondary | ICD-10-CM | POA: Diagnosis not present

## 2014-09-01 DIAGNOSIS — I87303 Chronic venous hypertension (idiopathic) without complications of bilateral lower extremity: Secondary | ICD-10-CM | POA: Diagnosis not present

## 2014-09-01 DIAGNOSIS — L97912 Non-pressure chronic ulcer of unspecified part of right lower leg with fat layer exposed: Secondary | ICD-10-CM | POA: Diagnosis not present

## 2014-09-29 DIAGNOSIS — C439 Malignant melanoma of skin, unspecified: Secondary | ICD-10-CM | POA: Diagnosis not present

## 2014-11-27 ENCOUNTER — Encounter: Payer: Self-pay | Admitting: Podiatry

## 2014-11-27 ENCOUNTER — Ambulatory Visit (INDEPENDENT_AMBULATORY_CARE_PROVIDER_SITE_OTHER): Payer: Medicare Other | Admitting: Podiatry

## 2014-11-27 VITALS — BP 155/88 | HR 87 | Resp 12

## 2014-11-27 DIAGNOSIS — M2010 Hallux valgus (acquired), unspecified foot: Secondary | ICD-10-CM

## 2014-11-27 DIAGNOSIS — M79676 Pain in unspecified toe(s): Secondary | ICD-10-CM

## 2014-11-27 DIAGNOSIS — Q828 Other specified congenital malformations of skin: Secondary | ICD-10-CM

## 2014-11-27 DIAGNOSIS — G629 Polyneuropathy, unspecified: Secondary | ICD-10-CM

## 2014-11-27 DIAGNOSIS — B351 Tinea unguium: Secondary | ICD-10-CM

## 2014-11-28 DIAGNOSIS — M2010 Hallux valgus (acquired), unspecified foot: Secondary | ICD-10-CM | POA: Insufficient documentation

## 2014-11-28 NOTE — Progress Notes (Signed)
Patient ID: Cassidy [name], female   DOB: February 08, 1941, 74 y.o.   MRN: [id number]  Subjective: 74 year old female presents the office today for concerns of thick painful calluses to both of her feet which are painful when she walks. She denies any redness or drainage from along the area. She has a states that her big toenails are severely thickened and painful with shoe gear and pressure. She is unable to trim them herself. No other complaints at this time in no acute changes since last upon it.  Objective: AAO 3, NAD DP/PT pulses are palpable, CRT less than 3 seconds  Protective sensation absent with Derrel Nip monofilament Significant HAV deformity is present bilaterally with a leftward the right. There is a partial invitational left second digit. Hammertoe contractures present. Hyperkeratotic lesion present right plantar hallux with what appears to be some dried blood underneath the callus. Hyperkeratotic lesions were also present medial first MTPJ on the left and submetatarsal 5 bilaterally. Upon debridement was lesions was no underlying ulceration, drainage or other clinical signs of infection. There is no surrounding erythema. No malodor. Bilateral hallux nails are really hypertrophic, dystrophic, discolored, brittle, elongated. There is tenderness palpation upon the nails 2. There is no surrounding erythema or drainage. No other open lesions or pre-ulcer lesions identified bilaterally No pain with calf compression, swelling, warmth, erythema.  Assessment: 74 year old female with pre-ulcerative calluses bilaterally, symptomatic onychomycosis  Plan: -Treatment options discussed including all alternatives, risks, and complications -Nail sharply debrided 2 without complications/bleeding -Hyperkeratotic lesion sharply debrided 4 without complications/bleeding -Discussed the importance of daily foot inspection -Follow-up 3 months or sooner if any problems arise. In the meantime,  encouraged to call the office with any questions, concerns, change in symptoms.   Celesta Gentile, DPM

## 2014-12-13 DIAGNOSIS — C4370 Malignant melanoma of unspecified lower limb, including hip: Secondary | ICD-10-CM | POA: Insufficient documentation

## 2015-01-08 DIAGNOSIS — Z87898 Personal history of other specified conditions: Secondary | ICD-10-CM | POA: Diagnosis not present

## 2015-01-08 DIAGNOSIS — Z85820 Personal history of malignant melanoma of skin: Secondary | ICD-10-CM | POA: Diagnosis not present

## 2015-01-08 DIAGNOSIS — D225 Melanocytic nevi of trunk: Secondary | ICD-10-CM | POA: Diagnosis not present

## 2015-01-08 DIAGNOSIS — Z86018 Personal history of other benign neoplasm: Secondary | ICD-10-CM | POA: Diagnosis not present

## 2015-01-08 DIAGNOSIS — L821 Other seborrheic keratosis: Secondary | ICD-10-CM | POA: Diagnosis not present

## 2015-01-15 DIAGNOSIS — H25013 Cortical age-related cataract, bilateral: Secondary | ICD-10-CM | POA: Diagnosis not present

## 2015-01-15 DIAGNOSIS — H52203 Unspecified astigmatism, bilateral: Secondary | ICD-10-CM | POA: Diagnosis not present

## 2015-02-01 ENCOUNTER — Other Ambulatory Visit: Payer: Self-pay | Admitting: Internal Medicine

## 2015-02-01 NOTE — Telephone Encounter (Signed)
Past due for 6 month recheck and labs. Please call.

## 2015-02-08 ENCOUNTER — Other Ambulatory Visit: Payer: Medicare Other | Admitting: Internal Medicine

## 2015-02-12 ENCOUNTER — Other Ambulatory Visit: Payer: Medicare Other | Admitting: Internal Medicine

## 2015-02-12 ENCOUNTER — Other Ambulatory Visit: Payer: Self-pay | Admitting: Internal Medicine

## 2015-02-12 ENCOUNTER — Ambulatory Visit: Payer: Medicare Other | Admitting: Internal Medicine

## 2015-02-12 DIAGNOSIS — E785 Hyperlipidemia, unspecified: Secondary | ICD-10-CM

## 2015-02-12 DIAGNOSIS — E039 Hypothyroidism, unspecified: Secondary | ICD-10-CM | POA: Diagnosis not present

## 2015-02-12 DIAGNOSIS — Z8639 Personal history of other endocrine, nutritional and metabolic disease: Secondary | ICD-10-CM | POA: Diagnosis not present

## 2015-02-12 DIAGNOSIS — I10 Essential (primary) hypertension: Secondary | ICD-10-CM

## 2015-02-12 DIAGNOSIS — E7800 Pure hypercholesterolemia, unspecified: Secondary | ICD-10-CM

## 2015-02-12 LAB — TSH: TSH: 1.506 u[IU]/mL (ref 0.350–4.500)

## 2015-02-12 LAB — BASIC METABOLIC PANEL
BUN: 14 mg/dL (ref 7–25)
CO2: 28 mmol/L (ref 20–31)
Calcium: 9.7 mg/dL (ref 8.6–10.4)
Chloride: 103 mmol/L (ref 98–110)
Creat: 0.75 mg/dL (ref 0.60–0.93)
GLUCOSE: 94 mg/dL (ref 65–99)
POTASSIUM: 4.2 mmol/L (ref 3.5–5.3)
Sodium: 140 mmol/L (ref 135–146)

## 2015-02-13 ENCOUNTER — Ambulatory Visit (INDEPENDENT_AMBULATORY_CARE_PROVIDER_SITE_OTHER): Payer: Medicare Other | Admitting: Internal Medicine

## 2015-02-13 ENCOUNTER — Telehealth: Payer: Self-pay

## 2015-02-13 ENCOUNTER — Encounter: Payer: Self-pay | Admitting: Internal Medicine

## 2015-02-13 VITALS — BP 140/80 | HR 88 | Temp 98.0°F | Resp 18 | Ht 66.0 in | Wt 224.0 lb

## 2015-02-13 DIAGNOSIS — E039 Hypothyroidism, unspecified: Secondary | ICD-10-CM

## 2015-02-13 DIAGNOSIS — E669 Obesity, unspecified: Secondary | ICD-10-CM

## 2015-02-13 DIAGNOSIS — E119 Type 2 diabetes mellitus without complications: Secondary | ICD-10-CM

## 2015-02-13 DIAGNOSIS — E7800 Pure hypercholesterolemia, unspecified: Secondary | ICD-10-CM | POA: Diagnosis not present

## 2015-02-13 DIAGNOSIS — Z85820 Personal history of malignant melanoma of skin: Secondary | ICD-10-CM

## 2015-02-13 DIAGNOSIS — I10 Essential (primary) hypertension: Secondary | ICD-10-CM

## 2015-02-13 DIAGNOSIS — Z87828 Personal history of other (healed) physical injury and trauma: Secondary | ICD-10-CM | POA: Diagnosis not present

## 2015-02-13 LAB — HEMOGLOBIN A1C
Hgb A1c MFr Bld: 5.8 % — ABNORMAL HIGH (ref <5.7)
MEAN PLASMA GLUCOSE: 120 mg/dL — AB (ref <117)

## 2015-02-13 NOTE — Telephone Encounter (Signed)
Verbally added on an A1C to patient labs drawn 02/12/15

## 2015-02-14 ENCOUNTER — Telehealth: Payer: Self-pay

## 2015-02-14 LAB — MICROALBUMIN, URINE: Microalb, Ur: 0.7 mg/dL

## 2015-02-14 NOTE — Progress Notes (Signed)
   Subjective:    Patient ID: Cassidy [name], female    DOB: 10-22-1940, 74 y.o.   MRN: [id number]  HPI Six-month recheck on essential hypertension, hypothyroidism, controlled by 2 diabetes mellitus. Continues to be overweight. Has issues with dependent edema. Wearing lightweight compression stockings prescribed at [facility] by wound care physician. She had a nonhealing ulcer right lateral ankle treated there. It is now well-healed. History of multiple melanomas. Feels well. Basic metabolic panel is normal. Urine sent for microalbumin.    Review of Systems     Objective:   Physical Exam Skin: pale warm and dry. Nodes none. Neck supple without JVD thyromegaly or carotid bruits. Chest clear to auscultation. Cardiac exam regular rate and rhythm normal S1 and S2. Extremities trace lower extremity edema that is nonpitting.       Assessment & Plan:  Obesity-due to issues with lower extremities, not really able to do much exercise  Essential hypertension-stable and controlled  Hypothyroidism-TSH normal on thyroid replacement therapy  Controlled type 2 diabetes mellitus-hemoglobin A1c excellent at 5.8%  History of elevated LDL cholesterol-to repeat lipid panel in 6 months  Plan: Continue same medications and return in 6 months

## 2015-02-14 NOTE — Patient Instructions (Signed)
It was a pleasure to see you today. Continue same medications. Watch diet. Return in 6 months for physical exam.

## 2015-02-14 NOTE — Telephone Encounter (Signed)
-----   Message from Elby Showers, MD sent at 02/14/2015 11:47 AM EDT ----- Please call pt. Diabetes is under excellent control at 5.8%

## 2015-02-14 NOTE — Telephone Encounter (Signed)
Left message for patient to return call for lab results. 

## 2015-03-02 ENCOUNTER — Encounter: Payer: Self-pay | Admitting: Podiatry

## 2015-03-02 ENCOUNTER — Ambulatory Visit (INDEPENDENT_AMBULATORY_CARE_PROVIDER_SITE_OTHER): Payer: Medicare Other | Admitting: Podiatry

## 2015-03-02 VITALS — BP 152/92 | HR 68 | Resp 12

## 2015-03-02 DIAGNOSIS — B351 Tinea unguium: Secondary | ICD-10-CM | POA: Diagnosis not present

## 2015-03-02 DIAGNOSIS — G629 Polyneuropathy, unspecified: Secondary | ICD-10-CM

## 2015-03-02 DIAGNOSIS — Q828 Other specified congenital malformations of skin: Secondary | ICD-10-CM | POA: Diagnosis not present

## 2015-03-02 DIAGNOSIS — M79676 Pain in unspecified toe(s): Secondary | ICD-10-CM

## 2015-03-05 NOTE — Progress Notes (Signed)
Patient ID: Cassidy [name], female   DOB: 1940/11/13, 74 y.o.   MRN: [id number]  Subjective: [name] presents the office today for concerns of thick painful calluses to both of her feet which are painful when she walks. She denies any redness or drainage from along the area. She has a states that her big toenails are severely thickened and painful with shoe gear and pressure. She is unable to trim them herself. No other complaints at this time in no acute changes since last appointment.   Objective: AAO 3, NAD DP/PT pulses are palpable, CRT less than 3 seconds  Protective sensation absent with Cassidy [name] monofilament Significant HAV deformity is present bilaterally with a left worse than the right. There is a partial amputation left second digit. Hammertoe contractures present. Hyperkeratotic lesion present right plantar hallux with what appears to be some dried blood underneath the callus. Hyperkeratotic lesions were also present medial first MTPJ on the left and submetatarsal 5 bilaterally. Upon debridement was lesions was very superficial ulceration on the right foot under the hallux. There is no drainage or purulence. There is no swelling erythema, ascending cellulitis. No signs of infection. There is no underlying ulceration drainage or other signs of infection to the remaining hyperkeratotic lesions after debridement. Bilateral hypertrophic, dystrophic, brittle, discolored 9. There is tenderness palpation Nails 1-5 bilaterally except for the second digit which has a previous partial amputation.  No other open lesions or pre-ulcer lesions identified bilaterally No pain with calf compression, swelling, warmth, erythema.  Assessment: 74 year old female with pre-ulcerative calluses bilaterally, symptomatic onychomycosis  Plan: -Treatment options discussed including all alternatives, risks, and complications -Nail sharply debrided 9 without complications/bleeding -Hyperkeratotic  lesion sharply debrided 4 without complications -Discussed the importance of daily foot inspection -Recommended to continue with Neosporin and a Band-Aid overlying the left sub-hallux ulceration/wound. If there is not healed within 2 weeks to call the office for follow-up appointment or sooner if any problems are to arise. Monitor for any clinical signs or symptoms of infection and directed to call the office immediately should any occur or go to the ER. -Follow-up 3 months or sooner if any problems arise. In the meantime, encouraged to call the office with any questions, concerns, change in symptoms.   Cassidy [name], DPM

## 2015-03-21 ENCOUNTER — Other Ambulatory Visit: Payer: Self-pay | Admitting: Internal Medicine

## 2015-05-20 DIAGNOSIS — L03116 Cellulitis of left lower limb: Secondary | ICD-10-CM | POA: Diagnosis not present

## 2015-05-20 DIAGNOSIS — L97529 Non-pressure chronic ulcer of other part of left foot with unspecified severity: Secondary | ICD-10-CM | POA: Diagnosis not present

## 2015-05-22 DIAGNOSIS — M769 Unspecified enthesopathy, lower limb, excluding foot: Secondary | ICD-10-CM | POA: Diagnosis not present

## 2015-05-25 DIAGNOSIS — M86672 Other chronic osteomyelitis, left ankle and foot: Secondary | ICD-10-CM | POA: Diagnosis not present

## 2015-05-25 DIAGNOSIS — G8918 Other acute postprocedural pain: Secondary | ICD-10-CM | POA: Diagnosis not present

## 2015-05-25 DIAGNOSIS — M869 Osteomyelitis, unspecified: Secondary | ICD-10-CM | POA: Diagnosis not present

## 2015-06-05 DIAGNOSIS — M86672 Other chronic osteomyelitis, left ankle and foot: Secondary | ICD-10-CM | POA: Diagnosis not present

## 2015-06-08 ENCOUNTER — Ambulatory Visit: Payer: Medicare Other | Admitting: Podiatry

## 2015-06-29 ENCOUNTER — Encounter: Payer: Self-pay | Admitting: *Deleted

## 2015-07-16 DIAGNOSIS — Z411 Encounter for cosmetic surgery: Secondary | ICD-10-CM | POA: Insufficient documentation

## 2015-08-14 ENCOUNTER — Other Ambulatory Visit: Payer: Medicare Other | Admitting: Internal Medicine

## 2015-08-15 DIAGNOSIS — D2272 Melanocytic nevi of left lower limb, including hip: Secondary | ICD-10-CM | POA: Diagnosis not present

## 2015-08-15 DIAGNOSIS — Z87898 Personal history of other specified conditions: Secondary | ICD-10-CM | POA: Diagnosis not present

## 2015-08-15 DIAGNOSIS — D485 Neoplasm of uncertain behavior of skin: Secondary | ICD-10-CM | POA: Diagnosis not present

## 2015-08-15 DIAGNOSIS — D0359 Melanoma in situ of other part of trunk: Secondary | ICD-10-CM | POA: Diagnosis not present

## 2015-08-15 DIAGNOSIS — Z85820 Personal history of malignant melanoma of skin: Secondary | ICD-10-CM | POA: Diagnosis not present

## 2015-08-15 DIAGNOSIS — D225 Melanocytic nevi of trunk: Secondary | ICD-10-CM | POA: Diagnosis not present

## 2015-08-15 DIAGNOSIS — Z86018 Personal history of other benign neoplasm: Secondary | ICD-10-CM | POA: Diagnosis not present

## 2015-08-15 DIAGNOSIS — L821 Other seborrheic keratosis: Secondary | ICD-10-CM | POA: Diagnosis not present

## 2015-08-17 ENCOUNTER — Encounter: Payer: Medicare Other | Admitting: Internal Medicine

## 2015-09-03 ENCOUNTER — Other Ambulatory Visit: Payer: Medicare Other | Admitting: Internal Medicine

## 2015-09-04 ENCOUNTER — Encounter: Payer: Medicare Other | Admitting: Internal Medicine

## 2015-09-07 DIAGNOSIS — C4359 Malignant melanoma of other part of trunk: Secondary | ICD-10-CM | POA: Diagnosis not present

## 2015-09-07 DIAGNOSIS — L089 Local infection of the skin and subcutaneous tissue, unspecified: Secondary | ICD-10-CM | POA: Diagnosis not present

## 2015-09-24 ENCOUNTER — Other Ambulatory Visit: Payer: Medicare Other | Admitting: Internal Medicine

## 2015-09-24 DIAGNOSIS — E559 Vitamin D deficiency, unspecified: Secondary | ICD-10-CM | POA: Diagnosis not present

## 2015-09-24 DIAGNOSIS — R7302 Impaired glucose tolerance (oral): Secondary | ICD-10-CM | POA: Diagnosis not present

## 2015-09-24 DIAGNOSIS — E039 Hypothyroidism, unspecified: Secondary | ICD-10-CM

## 2015-09-24 DIAGNOSIS — I10 Essential (primary) hypertension: Secondary | ICD-10-CM

## 2015-09-24 DIAGNOSIS — Z0000 Encounter for general adult medical examination without abnormal findings: Secondary | ICD-10-CM | POA: Diagnosis not present

## 2015-09-24 DIAGNOSIS — Z79899 Other long term (current) drug therapy: Secondary | ICD-10-CM

## 2015-09-24 DIAGNOSIS — E7800 Pure hypercholesterolemia, unspecified: Secondary | ICD-10-CM

## 2015-09-24 LAB — COMPLETE METABOLIC PANEL WITH GFR
ALBUMIN: 4.2 g/dL (ref 3.6–5.1)
ALK PHOS: 87 U/L (ref 33–130)
ALT: 21 U/L (ref 6–29)
AST: 19 U/L (ref 10–35)
BUN: 11 mg/dL (ref 7–25)
CALCIUM: 9.3 mg/dL (ref 8.6–10.4)
CO2: 27 mmol/L (ref 20–31)
Chloride: 101 mmol/L (ref 98–110)
Creat: 0.65 mg/dL (ref 0.60–0.93)
GFR, EST NON AFRICAN AMERICAN: 87 mL/min (ref >=60)
GFR, Est African American: >89 mL/min (ref >=60)
Glucose, Bld: 96 mg/dL (ref 65–99)
POTASSIUM: 4.1 mmol/L (ref 3.5–5.3)
Sodium: 139 mmol/L (ref 135–146)
Total Bilirubin: 0.8 mg/dL (ref 0.2–1.2)
Total Protein: 7.1 g/dL (ref 6.1–8.1)

## 2015-09-24 LAB — CBC WITH DIFFERENTIAL/PLATELET
BASOS ABS: 0 {cells}/uL (ref 0–200)
Basophils Relative: 0 %
EOS ABS: 94 {cells}/uL (ref 15–500)
Eosinophils Relative: 1 %
HEMATOCRIT: 44 % (ref 35.0–45.0)
HEMOGLOBIN: 14.6 g/dL (ref 11.7–15.5)
LYMPHS ABS: 1598 {cells}/uL (ref 850–3900)
Lymphocytes Relative: 17 %
MCH: 30 pg (ref 27.0–33.0)
MCHC: 33.2 g/dL (ref 32.0–36.0)
MCV: 90.5 fL (ref 80.0–100.0)
MONO ABS: 846 {cells}/uL (ref 200–950)
MPV: 10.2 fL (ref 7.5–12.5)
Monocytes Relative: 9 %
NEUTROS PCT: 73 %
Neutro Abs: 6862 cells/uL (ref 1500–7800)
Platelets: 217 10*3/uL (ref 140–400)
RBC: 4.86 MIL/uL (ref 3.80–5.10)
RDW: 13.6 % (ref 11.0–15.0)
WBC: 9.4 10*3/uL (ref 3.8–10.8)

## 2015-09-24 LAB — LIPID PANEL
CHOL/HDL RATIO: 3.7 ratio (ref <=5.0)
CHOLESTEROL: 190 mg/dL (ref 125–200)
HDL: 52 mg/dL (ref >=46)
LDL Cholesterol: 118 mg/dL (ref <130)
Triglycerides: 102 mg/dL (ref <150)
VLDL: 20 mg/dL (ref <30)

## 2015-09-24 LAB — HEMOGLOBIN A1C
Hgb A1c MFr Bld: 5.6 % (ref <5.7)
Mean Plasma Glucose: 114 mg/dL

## 2015-09-25 ENCOUNTER — Encounter: Payer: Self-pay | Admitting: Internal Medicine

## 2015-09-25 LAB — TSH: TSH: 0.87 mIU/L

## 2015-09-25 LAB — VITAMIN D 25 HYDROXY (VIT D DEFICIENCY, FRACTURES): VIT D 25 HYDROXY: 28 ng/mL — AB (ref 30–100)

## 2015-11-06 ENCOUNTER — Other Ambulatory Visit: Payer: Self-pay | Admitting: Internal Medicine

## 2015-11-06 NOTE — Telephone Encounter (Signed)
Has appt here in August she must keep

## 2015-11-22 ENCOUNTER — Encounter: Payer: Self-pay | Admitting: Internal Medicine

## 2015-11-22 ENCOUNTER — Ambulatory Visit (INDEPENDENT_AMBULATORY_CARE_PROVIDER_SITE_OTHER): Payer: Medicare Other | Admitting: Internal Medicine

## 2015-11-22 VITALS — BP 124/86 | HR 95 | Temp 98.2°F | Ht 66.0 in | Wt 231.0 lb

## 2015-11-22 DIAGNOSIS — M2011 Hallux valgus (acquired), right foot: Secondary | ICD-10-CM

## 2015-11-22 DIAGNOSIS — Z8619 Personal history of other infectious and parasitic diseases: Secondary | ICD-10-CM

## 2015-11-22 DIAGNOSIS — E039 Hypothyroidism, unspecified: Secondary | ICD-10-CM | POA: Diagnosis not present

## 2015-11-22 DIAGNOSIS — M2012 Hallux valgus (acquired), left foot: Secondary | ICD-10-CM | POA: Diagnosis not present

## 2015-11-22 DIAGNOSIS — R609 Edema, unspecified: Secondary | ICD-10-CM | POA: Diagnosis not present

## 2015-11-22 DIAGNOSIS — Z87898 Personal history of other specified conditions: Secondary | ICD-10-CM

## 2015-11-22 DIAGNOSIS — Z85820 Personal history of malignant melanoma of skin: Secondary | ICD-10-CM | POA: Diagnosis not present

## 2015-11-22 DIAGNOSIS — E669 Obesity, unspecified: Secondary | ICD-10-CM | POA: Diagnosis not present

## 2015-11-22 DIAGNOSIS — I10 Essential (primary) hypertension: Secondary | ICD-10-CM

## 2015-11-22 DIAGNOSIS — M8580 Other specified disorders of bone density and structure, unspecified site: Secondary | ICD-10-CM

## 2015-11-22 DIAGNOSIS — Z8639 Personal history of other endocrine, nutritional and metabolic disease: Secondary | ICD-10-CM

## 2015-11-22 DIAGNOSIS — IMO0001 Reserved for inherently not codable concepts without codable children: Secondary | ICD-10-CM

## 2015-11-22 DIAGNOSIS — Z0000 Encounter for general adult medical examination without abnormal findings: Secondary | ICD-10-CM | POA: Diagnosis not present

## 2015-11-22 DIAGNOSIS — Z86711 Personal history of pulmonary embolism: Secondary | ICD-10-CM | POA: Diagnosis not present

## 2015-11-22 DIAGNOSIS — S98132S Complete traumatic amputation of one left lesser toe, sequela: Secondary | ICD-10-CM

## 2015-11-22 LAB — POCT URINALYSIS DIPSTICK
BILIRUBIN UA: NEGATIVE
Blood, UA: NEGATIVE
GLUCOSE UA: NEGATIVE
Ketones, UA: NEGATIVE
NITRITE UA: NEGATIVE
Protein, UA: NEGATIVE
Spec Grav, UA: 1.02
UROBILINOGEN UA: 0.2
pH, UA: 5

## 2015-11-22 NOTE — Progress Notes (Signed)
Subjective:    Patient ID: Cassidy [name], female    DOB: 1940/08/06, 75 y.o.   MRN: [id number]  HPI  75 year old White Female in today for health maintenance exam and evaluation of medical issues. She has a history of hypertension, obesity, impaired glucose tolerance, hypothyroidism, multiple melanomas.  Previous colonoscopy done by Dr. Collene Mares in 2003 and had another colonoscopy in 2007 at [facility]. She needs to arrange repeat study either at [facility] are with Dr. Collene Mares.  She has GYN physician for GYN care.  She has remote history of migraine headaches but none in recent years.  In 2013 she had amputation of the left second toe due to a benign mass by Dr. Mayer Camel. History of onychomycosis. She does have an ulceration on her left medial MTP joint. She keeps her legs wrapped due to dependent edema.  Arthroscopic surgery for torn ligaments right knee June 2003. Right knee replacement July 2010. Subsequently developed bilateral pulmonary emboli after knee replacement surgery. Right wrist ganglion cyst surgery 2008. Left ankle surgery 2005 involving a bone graft.  Ovarian cystectomy 1968, hysterectomy 1982, rectocele repair 2009. Cholecystectomy 2003.  In 2015 she had melanoma removed from  Her dermatologist is Dr. Tonia Brooms who is retiring.  History of urinary infections improved status post GYN surgery in 2009.  Patient had Cardiolite study done at [facility] April 2009 which was normal.    Review of Systems no new complaints     Objective:   Physical Exam  Constitutional: She is oriented to person, place, and time. She appears well-developed and well-nourished. No distress.  HENT:  Head: Normocephalic and atraumatic.  Right Ear: External ear normal.  Left Ear: External ear normal.  Mouth/Throat: Oropharynx is clear and moist.  Eyes: Conjunctivae and EOM are normal. Pupils are equal, round, and reactive to light. Right eye exhibits no discharge. Left eye  exhibits no discharge.  Neck: Neck supple. No JVD present. No thyromegaly present.  Cardiovascular: Normal rate, regular rhythm, normal heart sounds and intact distal pulses.   Pulmonary/Chest: She has no wheezes. She has no rales.  Abdominal: Soft. Bowel sounds are normal. She exhibits no distension and no mass. There is no tenderness. There is no rebound and no guarding.  Genitourinary:  Genitourinary Comments: Deferred to GYN  Musculoskeletal:  1+ lower extremity edema  Lymphadenopathy:    She has no cervical adenopathy.  Neurological: She is alert and oriented to person, place, and time. She has normal reflexes. No cranial nerve deficit. Coordination normal.  Skin: Skin is warm and dry. No rash noted. She is not diaphoretic.  Psychiatric: She has a normal mood and affect. Her behavior is normal. Judgment and thought content normal.  Vitals reviewed.         Assessment & Plan:  Essential hypertension-stable on current regimen  Impaired glucose tolerance-Stable with diet. Hemoglobin A1c 5.6% and previously was 5.8%  Obesity  Hypothyroidism  History of migraine headaches-none in recent years  Dependent edema  Ulceration left MTP joint medially-sees podiatrist.    Bilateral hallux deformities-sees podiatrist  Vitamin D deficiency-recommend 2000 units vitamin D 3 daily  Plan: Reminded about annual eye exam. Patient should contact GI office regarding repeat colonoscopy  Subjective:   Patient presents for Medicare Annual/Subsequent preventive examination.  Review Past Medical/Family/Social: see above   Risk Factors  Current exercise habits: sedentary Dietary issues discussed: low fat low carb  Cardiac risk factors: Htn, obesity, Family Hx  Depression Screen  (Note: if  answer to either of the following is "Yes", a more complete depression screening is indicated)   Over the past two weeks, have you felt down, depressed or hopeless? No  Over the past two weeks,  have you felt little interest or pleasure in doing things? No Have you lost interest or pleasure in daily life? No Do you often feel hopeless? No Do you cry easily over simple problems? No   Activities of Daily Living  In your present state of health, do you have any difficulty performing the following activities?:   Driving? No  Managing money? No  Feeding yourself? No  Getting from bed to chair? No  Climbing a flight of stairs? No  Preparing food and eating?: No  Bathing or showering? No  Getting dressed: No  Getting to the toilet? No  Using the toilet:No  Moving around from place to place: No  In the past year have you fallen or had a near fall?:No  Are you sexually active? No  Do you have more than one partner? No   Hearing Difficulties: No  Do you often ask people to speak up or repeat themselves? No  Do you experience ringing or noises in your ears? No  Do you have difficulty understanding soft or whispered voices? No  Do you feel that you have a problem with memory? No Do you often misplace items? No    Home Safety:  Do you have a smoke alarm at your residence? Yes Do you have grab bars in the bathroom? no Do you have throw rugs in your house? no   Cognitive Testing  Alert? Yes Normal Appearance?Yes  Oriented to person? Yes Place? Yes  Time? Yes  Recall of three objects? Yes  Can perform simple calculations? Yes  Displays appropriate judgment?Yes  Can read the correct time from a watch face?Yes   List the Names of Other Physician/Practitioners you currently use:  See referral list for the physicians patient is currently seeing.  Has GYN  Sees podiatrist  Sees dermatologist   Review of Systems: See above   Objective:     General appearance: Appears stated age and mildly obese  Head: Normocephalic, without obvious abnormality, atraumatic  Eyes: conj clear, EOMi PEERLA  Ears: normal TM's and external ear canals both ears  Nose: Nares normal. Septum  midline. Mucosa normal. No drainage or sinus tenderness.  Throat: lips, mucosa, and tongue normal; teeth and gums normal  Neck: no adenopathy, no carotid bruit, no JVD, supple, symmetrical, trachea midline and thyroid not enlarged, symmetric, no tenderness/mass/nodules  No CVA tenderness.  Lungs: clear to auscultation bilaterally  Breasts: normal appearance, no masses or tenderness, top of the pacemaker on left upper chest. Incision well-healed. It is tender.  Heart: regular rate and rhythm, S1, S2 normal, no murmur, click, rub or gallop  Abdomen: soft, non-tender; bowel sounds normal; no masses, no organomegaly  Musculoskeletal: ROM normal in all joints, no crepitus, no deformity, Normal muscle strengthen. Back  is symmetric, no curvature. Skin: Skin color, texture, turgor normal. No rashes or lesions  Lymph nodes: Cervical, supraclavicular, and axillary nodes normal.  Neurologic: CN 2 -12 Normal, Normal symmetric reflexes. Normal coordination and gait  Psych: Alert & Oriented x 3, Mood appear stable.    Assessment:    Annual wellness medicare exam   Plan:    During the course of the visit the patient was educated and counseled about appropriate screening and preventive services including:  Annual mammogram  Annual flu  vaccine      Patient Instructions (the written plan) was given to the patient.  Medicare Attestation  I have personally reviewed:  The patient's medical and social history  Their use of alcohol, tobacco or illicit drugs  Their current medications and supplements  The patient's functional ability including ADLs,fall risks, home safety risks, cognitive, and hearing and visual impairment  Diet and physical activities  Evidence for depression or mood disorders  The patient's weight, height, BMI, and visual acuity have been recorded in the chart. I have made referrals, counseling, and provided education to the patient based on review of the above and I have provided  the patient with a written personalized care plan for preventive services.

## 2015-11-23 LAB — MICROALBUMIN / CREATININE URINE RATIO
CREATININE, URINE: 13 mg/dL — AB (ref 20–320)
Microalb, Ur: <0.2 mg/dL

## 2015-12-09 NOTE — Patient Instructions (Signed)
It was a pleasure to see you today. Continue same medications and return in 6 months for office visit blood pressure check and fasting lab work. Continue to see podiatrist regarding foot problems and continue follow-up with dermatologist. Please call Dr. Lorie Apley office regarding colonoscopy.

## 2015-12-15 ENCOUNTER — Other Ambulatory Visit: Payer: Self-pay | Admitting: Internal Medicine

## 2016-01-21 DIAGNOSIS — H52203 Unspecified astigmatism, bilateral: Secondary | ICD-10-CM | POA: Diagnosis not present

## 2016-01-21 DIAGNOSIS — H25813 Combined forms of age-related cataract, bilateral: Secondary | ICD-10-CM | POA: Diagnosis not present

## 2016-01-28 ENCOUNTER — Encounter: Payer: Self-pay | Admitting: Podiatry

## 2016-01-28 ENCOUNTER — Ambulatory Visit (INDEPENDENT_AMBULATORY_CARE_PROVIDER_SITE_OTHER): Payer: Medicare Other | Admitting: Podiatry

## 2016-01-28 DIAGNOSIS — B351 Tinea unguium: Secondary | ICD-10-CM | POA: Diagnosis not present

## 2016-01-28 DIAGNOSIS — M79676 Pain in unspecified toe(s): Secondary | ICD-10-CM | POA: Diagnosis not present

## 2016-01-28 DIAGNOSIS — Q828 Other specified congenital malformations of skin: Secondary | ICD-10-CM

## 2016-01-28 DIAGNOSIS — G629 Polyneuropathy, unspecified: Secondary | ICD-10-CM

## 2016-01-28 NOTE — Progress Notes (Signed)
Patient ID: [name], female   DOB: 1940/08/12, 75 y.o.   MRN: [id number]  Subjective: Ms. Hyett presents the office today for concerns of thick painful calluses to both of her feet which are painful when she walks. She denies any redness or drainage from along the area. She has a states that her big toenails are severely thickened and painful with shoe gear and pressure. She is unable to trim them herself. No other complaints at this time in no acute changes since last appointment.   Objective: AAO 3, NAD DP/PT pulses are palpable, CRT less than 3 seconds  Protective sensation absent with Derrel Nip monofilament Hyperkeratotic lesion present right plantar hallux. Hyperkeratotic lesions were also present medial first MTPJ on the left and submetatarsal 5 bilaterally. Upon debridement was lesions no underlying ulceration, drainage, or signs of infection.  Nails are hypertrophic, dystrophic, brittle, discolored 9. There is tenderness palpation to the nails.  Nails 1-5 bilaterally except for the second digit which has a previous partial amputation.  No other open lesions or pre-ulcer lesions identified bilaterally No pain with calf compression, swelling, warmth, erythema.  Assessment: 75 year old female with pre-ulcerative calluses bilaterally, symptomatic onychomycosis  Plan: -Treatment options discussed including all alternatives, risks, and complications -Nail sharply debrided 9 without complications/bleeding -Hyperkeratotic lesion sharply debrided 4 without complications -Discussed the importance of daily foot inspection -Monitor for any clinical signs or symptoms of infection and directed to call the office immediately should any occur or go to the ER. -Follow-up 3 months or sooner if any problems arise. In the meantime, encouraged to call the office with any questions, concerns, change in symptoms.   Celesta Gentile, DPM

## 2016-05-26 ENCOUNTER — Other Ambulatory Visit: Payer: Medicare Other | Admitting: Internal Medicine

## 2016-05-26 DIAGNOSIS — E7800 Pure hypercholesterolemia, unspecified: Secondary | ICD-10-CM

## 2016-05-26 DIAGNOSIS — R7302 Impaired glucose tolerance (oral): Secondary | ICD-10-CM

## 2016-05-26 DIAGNOSIS — E039 Hypothyroidism, unspecified: Secondary | ICD-10-CM

## 2016-05-26 LAB — LIPID PANEL
Cholesterol: 198 mg/dL (ref <200)
HDL: 48 mg/dL — AB (ref >50)
LDL Cholesterol: 124 mg/dL — ABNORMAL HIGH (ref <100)
Total CHOL/HDL Ratio: 4.1 Ratio (ref <5.0)
Triglycerides: 132 mg/dL (ref <150)
VLDL: 26 mg/dL (ref <30)

## 2016-05-26 LAB — TSH: TSH: 1.16 m[IU]/L

## 2016-05-27 LAB — HEMOGLOBIN A1C
HEMOGLOBIN A1C: 5.4 % (ref <5.7)
Mean Plasma Glucose: 108 mg/dL

## 2016-05-30 ENCOUNTER — Ambulatory Visit (INDEPENDENT_AMBULATORY_CARE_PROVIDER_SITE_OTHER): Payer: Medicare Other | Admitting: Podiatry

## 2016-05-30 ENCOUNTER — Encounter: Payer: Self-pay | Admitting: Podiatry

## 2016-05-30 VITALS — BP 168/88 | HR 65 | Resp 18

## 2016-05-30 DIAGNOSIS — B351 Tinea unguium: Secondary | ICD-10-CM | POA: Diagnosis not present

## 2016-05-30 DIAGNOSIS — Q828 Other specified congenital malformations of skin: Secondary | ICD-10-CM

## 2016-05-30 DIAGNOSIS — G629 Polyneuropathy, unspecified: Secondary | ICD-10-CM | POA: Diagnosis not present

## 2016-05-30 DIAGNOSIS — M79676 Pain in unspecified toe(s): Secondary | ICD-10-CM

## 2016-05-30 NOTE — Progress Notes (Signed)
Patient ID: Cassidy [name], female   DOB: [birth year], 76 y.o.   MRN: [id number]  Subjective: Cassidy [name] presents the office today for concerns of thick painful calluses to both of her feet which are painful when she walks. She also states that she has painful, thick toenails which cause irritation in shoes. She denies any redness or drainage from along the area.  She is unable to trim them herself. No other complaints at this time in no acute changes since last appointment.   Objective: AAO 3, NAD DP/PT pulses are palpable, CRT less than 3 seconds  Protective sensation absent with Derrel Nip monofilament Hyperkeratotic lesion present right plantar hallux. Hyperkeratotic lesions were also present medial first MTPJ on the left and submetatarsal 5 bilaterally. Upon debridement was lesions no underlying ulceration, drainage, or signs of infection.  Nails are hypertrophic, dystrophic, brittle, discolored 9. There is tenderness palpation to the nails.  Nails 1-5 bilaterally except for the second digit which has a previous partial amputation.  No other open lesions or pre-ulcer lesions identified bilaterally No pain with calf compression, swelling, warmth, erythema.  Assessment: 76 year old female with pre-ulcerative calluses bilaterally, symptomatic onychomycosis  Plan: -Treatment options discussed including all alternatives, risks, and complications -Nail sharply debrided 9 without complications/bleeding -Hyperkeratotic lesion sharply debrided 4 without complications -Discussed the importance of daily foot inspection -Monitor for any clinical signs or symptoms of infection and directed to call the office immediately should any occur or go to the ER. -Follow-up 3 months or sooner if any problems arise. In the meantime, encouraged to call the office with any questions, concerns, change in symptoms.   Celesta Gentile, DPM

## 2016-06-02 ENCOUNTER — Encounter: Payer: Self-pay | Admitting: Internal Medicine

## 2016-06-02 ENCOUNTER — Ambulatory Visit (INDEPENDENT_AMBULATORY_CARE_PROVIDER_SITE_OTHER): Payer: Medicare Other | Admitting: Internal Medicine

## 2016-06-02 VITALS — BP 140/80 | HR 106 | Temp 99.0°F | Wt 229.0 lb

## 2016-06-02 DIAGNOSIS — R609 Edema, unspecified: Secondary | ICD-10-CM | POA: Diagnosis not present

## 2016-06-02 DIAGNOSIS — E8881 Metabolic syndrome: Secondary | ICD-10-CM

## 2016-06-02 DIAGNOSIS — E785 Hyperlipidemia, unspecified: Secondary | ICD-10-CM

## 2016-06-02 DIAGNOSIS — E039 Hypothyroidism, unspecified: Secondary | ICD-10-CM

## 2016-06-02 DIAGNOSIS — Z87898 Personal history of other specified conditions: Secondary | ICD-10-CM | POA: Diagnosis not present

## 2016-06-02 DIAGNOSIS — I10 Essential (primary) hypertension: Secondary | ICD-10-CM | POA: Diagnosis not present

## 2016-06-02 NOTE — Patient Instructions (Signed)
Continue same medications and return in 2 weeks for office visit and blood pressure check. Take medication before coming to office. Otherwise return in 6 months for physical exam

## 2016-06-02 NOTE — Progress Notes (Signed)
   Subjective:    Patient ID: Cassidy [name], female    DOB: 05-16-1940, 76 y.o.   MRN: [id number]  HPI  For six-month follow-up of hyperlipidemia, impaired glucose tolerance, essential hypertension and hypothyroidism. TSH is normal. Hemoglobin A1c is 5.4% which is excellent. LDL cholesterol is 124. Needs to watch diet and exercise a bit. She is in the process of moving to a smaller place and wants to put her house on the market. She hates to do this in early March.  Grandchildren are all doing well and she's 30 with that.  Her blood pressure is elevated today at 150/80 with large cuff by my reading. She failed to take medication before coming to office. She'll need to return in 2 weeks for office visit blood pressure check   Review of Systems see above no new complaints. She saw foot doctor recently.     Objective:   Physical Exam Skin warm and dry. Nodes none. Neck is supple no thyromegaly. Chest clear to auscultation. Cardiac exam regular rate and rhythm. No lower extremity edema       Assessment & Plan:  Essential hypertension-blood pressure elevated today because she did not take medication prior to coming to office. Is to return in 2 weeks and have medication on board before coming to office.  Hypothyroidism-stable thyroid replacement  Hyperlipidemia-watch diet and exercise. Recheck in 6 months. LDL is elevated.  Impaired glucose tolerance-hemoglobin A1c is now within normal range  Plan: Return in 6 months for physical examination but return in 2 weeks for nurse visit and blood pressure check

## 2016-06-16 ENCOUNTER — Encounter: Payer: Self-pay | Admitting: Internal Medicine

## 2016-06-16 ENCOUNTER — Ambulatory Visit (INDEPENDENT_AMBULATORY_CARE_PROVIDER_SITE_OTHER): Payer: Medicare Other | Admitting: Internal Medicine

## 2016-06-16 VITALS — BP 140/90 | HR 70

## 2016-06-16 DIAGNOSIS — I10 Essential (primary) hypertension: Secondary | ICD-10-CM

## 2016-06-16 MED ORDER — LOSARTAN POTASSIUM 100 MG PO TABS: 100.0000 mg | ORAL_TABLET | Freq: Every day | ORAL | 3 refills | Status: DC

## 2016-06-16 NOTE — Patient Instructions (Signed)
Add losartan 100 mg daily to current blood pressure regimen and return to see me in 2 weeks. Will need b- met at that time.

## 2016-06-16 NOTE — Progress Notes (Signed)
   Subjective:    Patient ID: Cassidy [name], female    DOB: 10-07-1940, 76 y.o.   MRN: [id number]  HPI Blood pressure continues to show fluctuation. She is on Norvasc 5 mg daily and Lasix 40 mg daily. Going to add losartan 100 mg daily and see her again in 2 weeks.    Review of Systems     Objective:   Physical Exam  Not examined. Seen by CMA.      Assessment & Plan:  Elevated blood pressure  Essential hypertension  Plan: Losartan 100 mg daily and return in 2 weeks. Will need b- met at that time.

## 2016-06-30 ENCOUNTER — Ambulatory Visit: Payer: Medicare Other | Admitting: Internal Medicine

## 2016-07-07 ENCOUNTER — Ambulatory Visit (INDEPENDENT_AMBULATORY_CARE_PROVIDER_SITE_OTHER): Payer: Medicare Other | Admitting: Internal Medicine

## 2016-07-07 ENCOUNTER — Encounter: Payer: Self-pay | Admitting: Internal Medicine

## 2016-07-07 VITALS — BP 120/82 | HR 106 | Temp 97.9°F | Ht 66.0 in | Wt 230.0 lb

## 2016-07-07 DIAGNOSIS — R609 Edema, unspecified: Secondary | ICD-10-CM

## 2016-07-07 DIAGNOSIS — I10 Essential (primary) hypertension: Secondary | ICD-10-CM | POA: Diagnosis not present

## 2016-07-07 DIAGNOSIS — E6609 Other obesity due to excess calories: Secondary | ICD-10-CM

## 2016-07-07 LAB — BASIC METABOLIC PANEL
BUN: 13 mg/dL (ref 7–25)
CO2: 28 mmol/L (ref 20–31)
CREATININE: 0.75 mg/dL (ref 0.60–0.93)
Calcium: 9.9 mg/dL (ref 8.6–10.4)
Chloride: 103 mmol/L (ref 98–110)
GLUCOSE: 104 mg/dL — AB (ref 65–99)
Potassium: 4.7 mmol/L (ref 3.5–5.3)
Sodium: 141 mmol/L (ref 135–146)

## 2016-07-07 NOTE — Patient Instructions (Signed)
Watch salt intake. Continue same medications. Return in August for physical examination. Lab work pending.

## 2016-07-07 NOTE — Progress Notes (Signed)
   Subjective:    Patient ID: Cassidy [name], female    DOB: 1940/11/20, 76 y.o.   MRN: [id number]  HPI At last visit February 26, blood pressure was elevated at 140/90. She was on Norvasc 5 mg daily and Lasix 40 mg daily. We added losartan 100 mg daily and blood pressure is much better today. B- met to be drawnToday on losartan . She says she felt sleepy when initially starting  losartan but seems to be adjusted to it. Continues in the process of moving to a new home and downsizing.  Continues to have issues with dependent edema and has to wrap her legs to keep the edema under control.    Review of Systems see above-no new complaints     Objective:   Physical Exam Chest clear. Cardiac exam regular rate and rhythm. Normal S1 and S2. Has 1+ pitting edema of the lower extremities.       Assessment & Plan:  Essential hypertension-improved with losartan. B- met pending.  Dependent edema  Obesity  Plan: Patient return in August for physical examination.

## 2016-07-07 NOTE — Progress Notes (Signed)
   Subjective:    Patient ID: Cassidy [name], female    DOB: March 23, 1941, 76 y.o.   MRN: [id number]  HPI 76 year old Female with HTN. At last visit, BP was elevated. We added   BP 120/82  Review of Systems     Objective:   Physical Exam  Constitutional: She appears well-developed and well-nourished.  Cardiovascular: Normal rate, regular rhythm and normal heart sounds.   No murmur heard. Pulmonary/Chest: No respiratory distress. She has no wheezes.  Musculoskeletal: She exhibits no edema.  Skin: Skin is warm and dry.  Vitals reviewed.         Assessment & Plan:

## 2016-09-01 ENCOUNTER — Encounter: Payer: Self-pay | Admitting: Podiatry

## 2016-09-01 ENCOUNTER — Ambulatory Visit (INDEPENDENT_AMBULATORY_CARE_PROVIDER_SITE_OTHER): Payer: Medicare Other | Admitting: Podiatry

## 2016-09-01 DIAGNOSIS — M79676 Pain in unspecified toe(s): Secondary | ICD-10-CM | POA: Diagnosis not present

## 2016-09-01 DIAGNOSIS — G629 Polyneuropathy, unspecified: Secondary | ICD-10-CM

## 2016-09-01 DIAGNOSIS — Q828 Other specified congenital malformations of skin: Secondary | ICD-10-CM

## 2016-09-01 DIAGNOSIS — B351 Tinea unguium: Secondary | ICD-10-CM | POA: Diagnosis not present

## 2016-09-01 NOTE — Progress Notes (Signed)
Patient ID: Cassidy [name], female   DOB: 1940/05/02, 76 y.o.   MRN: [id number]  Subjective: Cassidy [name] presents the office today for concerns of thick painful calluses to both of her feet which are painful when she walks. She also states that she has painful, thick toenails which cause irritation in shoes. She denies any redness or drainage from along the area.  She is unable to trim them herself. No other complaints at this time in no acute changes since last appointment.   Objective: AAO 3, NAD DP/PT pulses are palpable, CRT less than 3 seconds  Protective sensation absent with Derrel Nip monofilament Hyperkeratotic lesion present right plantar hallux. Hyperkeratotic lesions were also present medial first MTPJ on the left and submetatarsal 5 bilaterally. Upon debridement was lesions no underlying ulceration, drainage, or signs of infection.  Nails are hypertrophic, dystrophic, brittle, discolored 9. There is tenderness palpation to the nails.  Nails 1-5 bilaterally except for the second digit which has a previous partial amputation.  No other open lesions or pre-ulcer lesions identified bilaterally No pain with calf compression, swelling, warmth, erythema.  Assessment: 76 year old female with pre-ulcerative calluses bilaterally, symptomatic onychomycosis  Plan: -Treatment options discussed including all alternatives, risks, and complications -Nail sharply debrided 9 without complications/bleeding -Hyperkeratotic lesion sharply debrided 4 without complications -Discussed the importance of daily foot inspection -Follow-up 3 months or sooner if any problems arise. In the meantime, encouraged to call the office with any questions, concerns, change in symptoms.   Celesta Gentile, DPM

## 2016-09-03 ENCOUNTER — Other Ambulatory Visit: Payer: Self-pay | Admitting: Internal Medicine

## 2016-11-14 ENCOUNTER — Other Ambulatory Visit: Payer: Self-pay | Admitting: Internal Medicine

## 2016-11-17 ENCOUNTER — Other Ambulatory Visit: Payer: Medicare Other | Admitting: Internal Medicine

## 2016-11-17 DIAGNOSIS — Z0000 Encounter for general adult medical examination without abnormal findings: Secondary | ICD-10-CM

## 2016-11-17 DIAGNOSIS — I10 Essential (primary) hypertension: Secondary | ICD-10-CM

## 2016-11-17 DIAGNOSIS — E6609 Other obesity due to excess calories: Secondary | ICD-10-CM

## 2016-11-17 DIAGNOSIS — E039 Hypothyroidism, unspecified: Secondary | ICD-10-CM

## 2016-11-17 LAB — LIPID PANEL
Cholesterol: 190 mg/dL (ref <200)
HDL: 45 mg/dL — AB (ref >50)
LDL CALC: 125 mg/dL — AB (ref <100)
TRIGLYCERIDES: 100 mg/dL (ref <150)
Total CHOL/HDL Ratio: 4.2 Ratio (ref <5.0)
VLDL: 20 mg/dL (ref <30)

## 2016-11-17 LAB — CBC WITH DIFFERENTIAL/PLATELET
BASOS ABS: 60 {cells}/uL (ref 0–200)
Basophils Relative: 1 %
EOS ABS: 240 {cells}/uL (ref 15–500)
Eosinophils Relative: 4 %
HEMATOCRIT: 41.1 % (ref 35.0–45.0)
Hemoglobin: 13.4 g/dL (ref 11.7–15.5)
Lymphocytes Relative: 31 %
Lymphs Abs: 1860 cells/uL (ref 850–3900)
MCH: 30.2 pg (ref 27.0–33.0)
MCHC: 32.6 g/dL (ref 32.0–36.0)
MCV: 92.6 fL (ref 80.0–100.0)
MONO ABS: 480 {cells}/uL (ref 200–950)
MPV: 10.2 fL (ref 7.5–12.5)
Monocytes Relative: 8 %
NEUTROS ABS: 3360 {cells}/uL (ref 1500–7800)
Neutrophils Relative %: 56 %
PLATELETS: 232 10*3/uL (ref 140–400)
RBC: 4.44 MIL/uL (ref 3.80–5.10)
RDW: 13.6 % (ref 11.0–15.0)
WBC: 6 10*3/uL (ref 3.8–10.8)

## 2016-11-17 LAB — COMPLETE METABOLIC PANEL WITH GFR
ALT: 16 U/L (ref 6–29)
AST: 17 U/L (ref 10–35)
Albumin: 4.2 g/dL (ref 3.6–5.1)
Alkaline Phosphatase: 84 U/L (ref 33–130)
BUN: 17 mg/dL (ref 7–25)
CALCIUM: 9.2 mg/dL (ref 8.6–10.4)
CHLORIDE: 107 mmol/L (ref 98–110)
CO2: 20 mmol/L (ref 20–31)
CREATININE: 0.74 mg/dL (ref 0.60–0.93)
GFR, EST NON AFRICAN AMERICAN: 79 mL/min (ref >=60)
Glucose, Bld: 95 mg/dL (ref 65–99)
POTASSIUM: 4.3 mmol/L (ref 3.5–5.3)
Sodium: 142 mmol/L (ref 135–146)
Total Bilirubin: 0.4 mg/dL (ref 0.2–1.2)
Total Protein: 6.7 g/dL (ref 6.1–8.1)

## 2016-11-17 LAB — TSH: TSH: 1.63 mIU/L

## 2016-11-24 ENCOUNTER — Encounter: Payer: Self-pay | Admitting: Internal Medicine

## 2016-11-24 ENCOUNTER — Ambulatory Visit (INDEPENDENT_AMBULATORY_CARE_PROVIDER_SITE_OTHER): Payer: Medicare Other | Admitting: Internal Medicine

## 2016-11-24 VITALS — BP 140/80 | HR 73 | Temp 98.3°F | Ht 63.0 in | Wt 238.0 lb

## 2016-11-24 DIAGNOSIS — Z85820 Personal history of malignant melanoma of skin: Secondary | ICD-10-CM | POA: Diagnosis not present

## 2016-11-24 DIAGNOSIS — I10 Essential (primary) hypertension: Secondary | ICD-10-CM

## 2016-11-24 DIAGNOSIS — M8580 Other specified disorders of bone density and structure, unspecified site: Secondary | ICD-10-CM

## 2016-11-24 DIAGNOSIS — Z1231 Encounter for screening mammogram for malignant neoplasm of breast: Secondary | ICD-10-CM | POA: Diagnosis not present

## 2016-11-24 DIAGNOSIS — E039 Hypothyroidism, unspecified: Secondary | ICD-10-CM | POA: Diagnosis not present

## 2016-11-24 DIAGNOSIS — E8881 Metabolic syndrome: Secondary | ICD-10-CM | POA: Diagnosis not present

## 2016-11-24 DIAGNOSIS — R8290 Unspecified abnormal findings in urine: Secondary | ICD-10-CM

## 2016-11-24 DIAGNOSIS — E119 Type 2 diabetes mellitus without complications: Secondary | ICD-10-CM | POA: Diagnosis not present

## 2016-11-24 DIAGNOSIS — Z0000 Encounter for general adult medical examination without abnormal findings: Secondary | ICD-10-CM | POA: Diagnosis not present

## 2016-11-24 DIAGNOSIS — Z8619 Personal history of other infectious and parasitic diseases: Secondary | ICD-10-CM | POA: Diagnosis not present

## 2016-11-24 DIAGNOSIS — R609 Edema, unspecified: Secondary | ICD-10-CM | POA: Diagnosis not present

## 2016-11-24 DIAGNOSIS — Z1239 Encounter for other screening for malignant neoplasm of breast: Secondary | ICD-10-CM

## 2016-11-24 DIAGNOSIS — E6609 Other obesity due to excess calories: Secondary | ICD-10-CM | POA: Diagnosis not present

## 2016-11-24 DIAGNOSIS — Z86711 Personal history of pulmonary embolism: Secondary | ICD-10-CM | POA: Diagnosis not present

## 2016-11-24 LAB — POCT URINALYSIS DIPSTICK
BILIRUBIN UA: NEGATIVE
GLUCOSE UA: NEGATIVE
Nitrite, UA: NEGATIVE
PH UA: 7 (ref 5.0–8.0)
Spec Grav, UA: 1.015 (ref 1.010–1.025)
Urobilinogen, UA: 0.2 E.U./dL

## 2016-11-24 MED ORDER — AMLODIPINE BESYLATE 5 MG PO TABS: ORAL_TABLET | ORAL | 1 refills | Status: DC

## 2016-11-24 MED ORDER — FUROSEMIDE 40 MG PO TABS: ORAL_TABLET | ORAL | 3 refills | Status: DC

## 2016-11-24 MED ORDER — SYNTHROID 100 MCG PO TABS: ORAL_TABLET | ORAL | 3 refills | Status: DC

## 2016-11-24 NOTE — Progress Notes (Signed)
Subjective:    Patient ID: Cassidy [name], female    DOB: 31-Aug-1940, 76 y.o.   MRN: [id number]  HPI 76 year old Female for health maintenance exam and evaluation of medical issues.  Has not had mammogram since 2014. We placed an order today and she will be reminded.  Says she has GYN for GYN care.  Remote history of migraine headaches but none increased years.  Previous colonoscopy done by Dr. Collene Mares in 2003 and had another colonoscopy in 2007 at [facility]. She needs to arrange repeat study either at [facility] or with Dr. Collene Mares.  In 2013 she had amputation of the left second toe due to a benign mass by Dr. Mayer Camel. History of onychomycosis.  She keeps her legs wrapped due to dependent team a. She has had an ulceration on her left medial MTP joint. She now has less edema than in years past.  Arthroscopic surgery for torn ligaments right knee June 2003.  Right knee arthroplasty July 2010. Subsequently developed bilateral pulmonary emboli after arthroplasty.  Right wrist ganglion cyst surgery 2008. Left ankle surgery 2005 involving a bone graft.  Ovarian cystectomy 1968, hysterectomy 1982, rectocele repair 2009. Cholecystectomy 2003.   In 2015, she had melanoma right back requiring a wide excision. In 2016, had melanoma excised from right foot. Also at that time had dysplastic nevus of the right shoulder for which she underwent wide excision.  In March 2016, she had redness swelling and drainage from previously healed wounds of right foot and ankle. She was placed on antibiotics. DVT was ruled out. Was seen at [facility] and diagnosed with delayed surgical wound healing and was treated there with the wound care center for several months.  Since March 2017, his been receiving Botox injections intermittently from Dr. Harlow Mares, plastic surgeon.  Has been told to have frequent checks for melanoma by dermatologist.  She had Cardiolite study  done L a [facility] April 2009 which was normal.  History of urinary infections improved status post GYN surgery in 2009. This was a posterior colporrhaphy. She was evaluated prior to surgery with normal colposcopy. Had had vaginal hysterectomy previously for fibroids and bleeding. She had a grade 3 rectocele with mild cystocele. Issues with rectocele since 2007. Cystocele did not warrant surgical correction.  Social history: Patient is divorced. She does not smoke. Social alcohol consumption. 2 adult children. Daughter with history of breast cancer doing well. 3 grandchildren.  Family history: Father died at age 76 of a sudden MI. Mother died at age 45 of heart problems. One brother killed in an airplane crash at age 62. 2 sisters in good health.  She has hypothyroidism, hypertension, osteopenia, dependent edema, hyperlipidemia, impaired glucose tolerance.             Review of Systems no new complaints     Objective:   Physical Exam  Constitutional: She appears well-developed and well-nourished. No distress.  HENT:  Head: Normocephalic and atraumatic.  Right Ear: External ear normal.  Left Ear: External ear normal.  Mouth/Throat: Oropharynx is clear and moist.  Eyes: Pupils are equal, round, and reactive to light. Conjunctivae and EOM are normal. Right eye exhibits no discharge. Left eye exhibits no discharge. No scleral icterus.  Neck: Neck supple. No JVD present. No thyromegaly present.  Cardiovascular: Normal rate, regular rhythm, normal heart sounds and intact distal pulses.   No murmur heard. Pulmonary/Chest: Effort normal and breath sounds normal. No respiratory distress.  She has no wheezes. She has no rales.  Breasts normal female  Abdominal: Soft. Bowel sounds are normal. She exhibits no distension and no mass. There is no tenderness. There is no rebound and no guarding.  Musculoskeletal: She exhibits no edema.  Lymphadenopathy:    She has no cervical adenopathy.  Skin:  Skin is warm and dry. No rash noted. She is not diaphoretic.  Psychiatric: She has a normal mood and affect. Her behavior is normal. Judgment and thought content normal.  Vitals reviewed.         Assessment & Plan:  Obesity Impaired glucose tolerance HTN Hypothyroidism Hx of migraine headaches-resolved Hyperlipidemia Osteopenia Dependent edema Onychomycosis Impaired glucose tolerance History of pulmonary emboli status post knee replacement surgery Amputation of left second toe mass 2013 History of melanoma x2  She did not take her diuretic today and will need to return in 2 weeks for office visit and blood pressure check. Blood pressure today is 140/80 without diuretic  She has an elevated LDL cholesterol of 125 and previously was 124 6 months ago  Hemoglobin A1c is normal at 5.5%  TSH is normal, CBC and C-met are normal  Plan: Continue same medications. Return in 2 weeks for blood pressure check and take diuretic one or 2 hours before coming to office.  Subjective:   Patient presents for Medicare Annual/Subsequent preventive examination.  Review Past Medical/Family/Social:See above   Risk Factors  Current exercise habits: Not a lot of exercise due to foot issues  Dietary issues discussed: Low fat low carbohydrate  Cardiac risk factors: Hyperlipidemia, family history of heart issues in both mother and father  Depression Screen  (Note: if answer to either of the following is "Yes", a more complete depression screening is indicated)   Over the past two weeks, have you felt down, depressed or hopeless? No  Over the past two weeks, have you felt little interest or pleasure in doing things? No Have you lost interest or pleasure in daily life? No Do you often feel hopeless? No Do you cry easily over simple problems? No   Activities of Daily Living  In your present state of health, do you have any difficulty performing the following activities?:   Driving? No    Managing money? No  Feeding yourself? No  Getting from bed to chair? No  Climbing a flight of stairs? No  Preparing food and eating?: No  Bathing or showering? No  Getting dressed: No  Getting to the toilet? No  Using the toilet:No  Moving around from place to place: No  In the past year have you fallen or had a near fall?:No  Are you sexually active? No  Do you have more than one partner? No   Hearing Difficulties: No  Do you often ask people to speak up or repeat themselves? No  Do you experience ringing or noises in your ears? No  Do you have difficulty understanding soft or whispered voices? No  Do you feel that you have a problem with memory? No Do you often misplace items? No    Home Safety:  Do you have a smoke alarm at your residence? Yes Do you have grab bars in the bathroom? Do you have throw rugs in your house?   Cognitive Testing  Alert? Yes Normal Appearance?Yes  Oriented to person? Yes Place? Yes  Time? Yes  Recall of three objects? Yes  Can perform simple calculations? Yes  Displays appropriate judgment?Yes  Can read the correct time  from a watch face?Yes   List the Names of Other Physician/Practitioners you currently use:  See referral list for the physicians patient is currently seeing.     Review of Systems: See above  Objective:     General appearance: Appears stated age and  obese  Head: Normocephalic, without obvious abnormality, atraumatic  Eyes: conj clear, EOMi PEERLA  Ears: normal TM's and external ear canals both ears  Nose: Nares normal. Septum midline. Mucosa normal. No drainage or sinus tenderness.  Throat: lips, mucosa, and tongue normal; teeth and gums normal  Neck: no adenopathy, no carotid bruit, no JVD, supple, symmetrical, trachea midline and thyroid not enlarged, symmetric, no tenderness/mass/nodules  No CVA tenderness.  Lungs: clear to auscultation bilaterally  Breasts: normal appearance, no masses or tenderness Heart:  regular rate and rhythm, S1, S2 normal, no murmur, click, rub or gallop  Abdomen: soft, non-tender; bowel sounds normal; no masses, no organomegaly  Musculoskeletal: ROM normal in all joints, no crepitus, no deformity, Normal muscle strengthen. Back  is symmetric, no curvature. Skin: Skin color, texture, turgor normal. No rashes or lesions  Lymph nodes: Cervical, supraclavicular, and axillary nodes normal.  Neurologic: CN 2 -12 Normal, Normal symmetric reflexes. Normal coordination and gait  Psych: Alert & Oriented x 3, Mood appear stable.    Assessment:    Annual wellness medicare exam   Plan:    During the course of the visit the patient was educated and counseled about appropriate screening and preventive services including:  Needs annual mammogram  Needs colonoscopy      Patient Instructions (the written plan) was given to the patient.  Medicare Attestation  I have personally reviewed:  The patient's medical and social history  Their use of alcohol, tobacco or illicit drugs  Their current medications and supplements  The patient's functional ability including ADLs,fall risks, home safety risks, cognitive, and hearing and visual impairment  Diet and physical activities  Evidence for depression or mood disorders  The patient's weight, height, BMI, and visual acuity have been recorded in the chart. I have made referrals, counseling, and provided education to the patient based on review of the above and I have provided the patient with a written personalized care plan for preventive services.

## 2016-11-25 LAB — HEMOGLOBIN A1C
Hgb A1c MFr Bld: 5.5 % (ref <5.7)
Mean Plasma Glucose: 111 mg/dL

## 2016-11-25 LAB — URINALYSIS, MICROSCOPIC ONLY
Casts: NONE SEEN [LPF]
Crystals: NONE SEEN [HPF]
Yeast: NONE SEEN [HPF]

## 2016-11-25 LAB — URINE CULTURE

## 2016-12-01 ENCOUNTER — Encounter: Payer: Self-pay | Admitting: Podiatry

## 2016-12-01 ENCOUNTER — Ambulatory Visit (INDEPENDENT_AMBULATORY_CARE_PROVIDER_SITE_OTHER): Payer: Medicare Other | Admitting: Podiatry

## 2016-12-01 DIAGNOSIS — G629 Polyneuropathy, unspecified: Secondary | ICD-10-CM

## 2016-12-01 DIAGNOSIS — B351 Tinea unguium: Secondary | ICD-10-CM

## 2016-12-01 DIAGNOSIS — M79676 Pain in unspecified toe(s): Secondary | ICD-10-CM | POA: Diagnosis not present

## 2016-12-01 DIAGNOSIS — Q828 Other specified congenital malformations of skin: Secondary | ICD-10-CM

## 2016-12-01 NOTE — Progress Notes (Signed)
Patient ID: Cassidy [name], female   DOB: 1941/01/12, 76 y.o.   MRN: [id number]  Subjective: Cassidy [name] presents the office today for concerns of thick painful calluses to both of her feet which are painful when she walks as well as toenails.  She denies any redness or drainage from along the area.  She is unable to trim them herself. No other complaints at this time in no acute changes since last appointment.   Objective: AAO 3, NAD DP/PT pulses are palpable, CRT less than 3 seconds  Protective sensation absent with Derrel Nip monofilament Hyperkeratotic lesion present right plantar hallux. Hyperkeratotic lesions were also present medial first MTPJ on the left and submetatarsal 5 bilaterally. Upon debridement was lesions no underlying ulceration, drainage, or signs of infection.  Nails are hypertrophic, dystrophic, brittle, discolored 9. There is tenderness palpation to the nails.  Nails 1-5 bilaterally except for the second digit which has a previous partial amputation.  No other open lesions or pre-ulcer lesions identified bilaterally No pain with calf compression, swelling, warmth, erythema.  Assessment: 76 year old female with pre-ulcerative calluses bilaterally, symptomatic onychomycosis  Plan: -Treatment options discussed including all alternatives, risks, and complications -Nail sharply debrided 9 without complications/bleeding -Hyperkeratotic lesion sharply debrided 4 without complications -Discussed the importance of daily foot inspection -Follow-up 3 months or sooner if any problems arise. In the meantime, encouraged to call the office with any questions, concerns, change in symptoms.   Celesta Gentile, DPM

## 2016-12-08 ENCOUNTER — Encounter: Payer: Self-pay | Admitting: Internal Medicine

## 2016-12-08 ENCOUNTER — Ambulatory Visit (INDEPENDENT_AMBULATORY_CARE_PROVIDER_SITE_OTHER): Payer: Medicare Other | Admitting: Internal Medicine

## 2016-12-08 VITALS — BP 128/84 | HR 68 | Temp 98.1°F | Ht 63.0 in | Wt 235.0 lb

## 2016-12-08 DIAGNOSIS — F419 Anxiety disorder, unspecified: Secondary | ICD-10-CM

## 2016-12-08 DIAGNOSIS — I10 Essential (primary) hypertension: Secondary | ICD-10-CM | POA: Diagnosis not present

## 2016-12-08 MED ORDER — ALPRAZOLAM 0.5 MG PO TABS: 0.5000 mg | ORAL_TABLET | Freq: Every evening | ORAL | 1 refills | Status: DC | PRN

## 2016-12-08 NOTE — Progress Notes (Signed)
   Subjective:    Patient ID: Cassidy [name], female    DOB: 29-Jun-1940, 76 y.o.   MRN: [id number]  HPI  For blood pressure follow-up. At last visit it was elevated. Today it is much improved after sitting for a few minutes. History of labile blood pressure. Has had Lasix today but at last visit had not taken it prior to coming.  Wants Xanax refill for occasional insomnia. Was taking 0.5 mg at bedtime without issues on a when necessary basis. Needs new prescription.This was provided  Review of Systemssee above     Objective:   Physical Exam Chest clear to auscultation. Cardiac exam regular rate and rhythm. Extremities dependent edema present but she wears compression stockings       Assessment & Plan:  Essential HTN-Has taken all of her meds this morning before coming to the office and blood pressure is stable  Element of labile/office hypertension  Plan: Continue same meds and RTC in 6 months

## 2016-12-08 NOTE — Patient Instructions (Addendum)
Continue same medications. Xanax given to take sparingly for insomnia. Return in 6 months

## 2016-12-17 NOTE — Patient Instructions (Addendum)
It was a pleasure to see you today. Take Lasix 2 hours before coming to office along with other antihypertensive medication and return for repeat blood pressure check in 2 weeks. Otherwise return in 6 months. Please have mammogram and consider colonoscopy

## 2017-01-15 DIAGNOSIS — M67441 Ganglion, right hand: Secondary | ICD-10-CM | POA: Diagnosis not present

## 2017-01-20 ENCOUNTER — Other Ambulatory Visit: Payer: Self-pay | Admitting: Internal Medicine

## 2017-01-20 DIAGNOSIS — Z1231 Encounter for screening mammogram for malignant neoplasm of breast: Secondary | ICD-10-CM

## 2017-02-23 ENCOUNTER — Ambulatory Visit (INDEPENDENT_AMBULATORY_CARE_PROVIDER_SITE_OTHER): Payer: Medicare Other | Admitting: Podiatry

## 2017-02-23 ENCOUNTER — Encounter: Payer: Self-pay | Admitting: Podiatry

## 2017-02-23 DIAGNOSIS — Q828 Other specified congenital malformations of skin: Secondary | ICD-10-CM

## 2017-02-23 DIAGNOSIS — B351 Tinea unguium: Secondary | ICD-10-CM

## 2017-02-23 DIAGNOSIS — M79676 Pain in unspecified toe(s): Secondary | ICD-10-CM | POA: Diagnosis not present

## 2017-02-23 DIAGNOSIS — G629 Polyneuropathy, unspecified: Secondary | ICD-10-CM | POA: Diagnosis not present

## 2017-02-23 NOTE — Progress Notes (Signed)
Patient ID: Cassidy [name], female   DOB: 1941/03/26, 76 y.o.   MRN: [id number]  Subjective: Cassidy [name] presents the office today for concerns of thick painful calluses to both of her feet which are painful when she walks as well as toenails which get painful. She denies any redness or drainage from along the area.  She is unable to trim them herself. No other complaints at this time in no acute changes since last appointment.   Objective: AAO 3, NAD DP/PT pulses are palpable, CRT less than 3 seconds  Protective sensation absent with Derrel Nip monofilament Hyperkeratotic lesion present right plantar hallux. Hyperkeratotic lesions were also present medial first MTPJ on the left and submetatarsal 5 bilaterally. Upon debridement was lesions no underlying ulceration, drainage, or signs of infection.  Nails are hypertrophic, dystrophic, brittle, discolored 9. There is tenderness palpation to the nails.  Nails 1-5 bilaterally except for the second digit which has a previous partial amputation.  No other open lesions or pre-ulcer lesions identified bilaterally No pain with calf compression, swelling, warmth, erythema.  Assessment: 76 year old female with pre-ulcerative calluses bilaterally, symptomatic onychomycosis  Plan: -Treatment options discussed including all alternatives, risks, and complications -Nail sharply debrided 9 without complications/bleeding -Hyperkeratotic lesion sharply debrided 4 without complications -Discussed the importance of daily foot inspection -Follow-up 3 months or sooner if any problems arise. In the meantime, encouraged to call the office with any questions, concerns, change in symptoms.   Celesta Gentile, DPM

## 2017-05-06 ENCOUNTER — Other Ambulatory Visit: Payer: Self-pay | Admitting: Internal Medicine

## 2017-05-06 DIAGNOSIS — I10 Essential (primary) hypertension: Secondary | ICD-10-CM

## 2017-05-06 DIAGNOSIS — R7302 Impaired glucose tolerance (oral): Secondary | ICD-10-CM

## 2017-05-06 DIAGNOSIS — E039 Hypothyroidism, unspecified: Secondary | ICD-10-CM

## 2017-05-13 DIAGNOSIS — M67441 Ganglion, right hand: Secondary | ICD-10-CM | POA: Diagnosis not present

## 2017-06-01 ENCOUNTER — Ambulatory Visit (INDEPENDENT_AMBULATORY_CARE_PROVIDER_SITE_OTHER): Payer: Medicare Other | Admitting: Podiatry

## 2017-06-01 DIAGNOSIS — Q828 Other specified congenital malformations of skin: Secondary | ICD-10-CM

## 2017-06-01 DIAGNOSIS — M79676 Pain in unspecified toe(s): Secondary | ICD-10-CM

## 2017-06-01 DIAGNOSIS — G629 Polyneuropathy, unspecified: Secondary | ICD-10-CM

## 2017-06-01 DIAGNOSIS — B351 Tinea unguium: Secondary | ICD-10-CM | POA: Diagnosis not present

## 2017-06-01 NOTE — Progress Notes (Signed)
Patient ID: [name], female   DOB: 20-Jul-1940, 77 y.o.   MRN: [id number]  Subjective: Ms. Ballentine presents the office today for concerns of thick painful calluses to both of her feet which are painful when she walks as well as toenails which get painful. She denies any redness or drainage from along the area.  She is unable to trim them herself. No other complaints at this time in no acute changes since last appointment.   Objective: AAO 3, NAD DP/PT pulses are palpable, CRT less than 3 seconds  Protective sensation absent with Derrel Nip monofilament Hyperkeratotic lesion present right plantar hallux. Hyperkeratotic lesions were also present medial first MTPJ on the left and submetatarsal 5 bilaterally and left submetatarsal 2. Upon debridement was lesions no underlying ulceration, drainage, or signs of infection.  Nails are hypertrophic, dystrophic, brittle, discolored 9. There is tenderness palpation to the nails.  Nails 1-5 bilaterally except for the second digit which has a previous partial amputation.  No other open lesions or pre-ulcer lesions identified bilaterally No pain with calf compression, swelling, warmth, erythema.  Assessment: 77 year old female with pre-ulcerative calluses bilaterally, symptomatic onychomycosis  Plan: -Treatment options discussed including all alternatives, risks, and complications -Nail sharply debrided 9 without complications/bleeding -Hyperkeratotic lesion sharply debrided 4 without complications -Discussed the importance of daily foot inspection -Follow-up 3 months or sooner if any problems arise. In the meantime, encouraged to call the office with any questions, concerns, change in symptoms.   Celesta Gentile, DPM

## 2017-06-02 ENCOUNTER — Other Ambulatory Visit: Payer: Medicare Other | Admitting: Internal Medicine

## 2017-06-04 ENCOUNTER — Other Ambulatory Visit: Payer: Medicare Other | Admitting: Internal Medicine

## 2017-06-04 DIAGNOSIS — I10 Essential (primary) hypertension: Secondary | ICD-10-CM

## 2017-06-04 DIAGNOSIS — R7302 Impaired glucose tolerance (oral): Secondary | ICD-10-CM

## 2017-06-04 DIAGNOSIS — E039 Hypothyroidism, unspecified: Secondary | ICD-10-CM

## 2017-06-05 LAB — BASIC METABOLIC PANEL
BUN: 15 mg/dL (ref 7–25)
CALCIUM: 9.4 mg/dL (ref 8.6–10.4)
CO2: 29 mmol/L (ref 20–32)
Chloride: 105 mmol/L (ref 98–110)
Creat: 0.8 mg/dL (ref 0.60–0.93)
GLUCOSE: 91 mg/dL (ref 65–99)
Potassium: 4.4 mmol/L (ref 3.5–5.3)
Sodium: 142 mmol/L (ref 135–146)

## 2017-06-05 LAB — MICROALBUMIN / CREATININE URINE RATIO
CREATININE, URINE: 116 mg/dL (ref 20–275)
MICROALB UR: 22 mg/dL
MICROALB/CREAT RATIO: 190 ug/mg{creat} — AB (ref <30)

## 2017-06-05 LAB — HEMOGLOBIN A1C
EAG (MMOL/L): 6.3 (calc)
Hgb A1c MFr Bld: 5.6 % of total Hgb (ref <5.7)
Mean Plasma Glucose: 114 (calc)

## 2017-06-05 LAB — TSH: TSH: 1.61 mIU/L (ref 0.40–4.50)

## 2017-06-08 ENCOUNTER — Ambulatory Visit (INDEPENDENT_AMBULATORY_CARE_PROVIDER_SITE_OTHER): Payer: Medicare Other | Admitting: Internal Medicine

## 2017-06-08 ENCOUNTER — Encounter: Payer: Self-pay | Admitting: Internal Medicine

## 2017-06-08 VITALS — BP 120/80 | HR 101 | Ht 63.0 in | Wt 229.0 lb

## 2017-06-08 DIAGNOSIS — E8881 Metabolic syndrome: Secondary | ICD-10-CM

## 2017-06-08 DIAGNOSIS — E039 Hypothyroidism, unspecified: Secondary | ICD-10-CM | POA: Diagnosis not present

## 2017-06-08 DIAGNOSIS — E119 Type 2 diabetes mellitus without complications: Secondary | ICD-10-CM

## 2017-06-08 DIAGNOSIS — I10 Essential (primary) hypertension: Secondary | ICD-10-CM

## 2017-06-08 DIAGNOSIS — Z6841 Body Mass Index (BMI) 40.0 and over, adult: Secondary | ICD-10-CM

## 2017-06-08 DIAGNOSIS — R609 Edema, unspecified: Secondary | ICD-10-CM | POA: Diagnosis not present

## 2017-06-08 DIAGNOSIS — Z8619 Personal history of other infectious and parasitic diseases: Secondary | ICD-10-CM | POA: Diagnosis not present

## 2017-06-08 NOTE — Progress Notes (Signed)
   Subjective:    Patient ID: [name], female    DOB: [birth year], 77 y.o.   MRN: [id number]  HPI 77 year old female in today for 67-month recheck.  History of impaired glucose tolerance, hypertension, and hypothyroidism.  Blood pressure is excellent today.  No new complaints.  Has been trying to eat more vegetables.  Has lost 9 pounds since August.  Not really able to exercise due to significant lower extremity edema issues.  Hemoglobin A1c excellent at 5.6%.  TSH is normal.  BUN and creatinine are normal as is potassium.  Review of Systems she has compression device for lower extremity edema that she should use twice daily but generally just uses it once a day.     Objective:   Physical Exam  Skin warm and dry.  Neck supple without JVD thyromegaly or carotid bruits.  Chest clear.  Cardiac exam regular rate and rhythm.  Her legs are wrapped and they were not unwrapped today.  She does see a podiatrist, Dr. Jacqualyn Posey regularly for callus debridement and management of onychomycosis.      Assessment & Plan:  History of impaired glucose tolerance with normal hemoglobin A1c  Essential hypertension-stable on current regimen  Hypothyroidism-TSH is stable on thyroid replacement  Obesity-has lost 9 pounds  Lower extremity edema treated with compression devices and diuretic  Plan: Continue current regimen and return in 6 months for physical exam.

## 2017-06-08 NOTE — Patient Instructions (Signed)
It was a pleasure to see you today.  Lab work is within normal limits.  Continue to work on weight loss.  Continue same medications.  Follow-up with physical exam in 6 months.  Continue to see podiatrist.

## 2017-07-06 ENCOUNTER — Ambulatory Visit
Admission: RE | Admit: 2017-07-06 | Discharge: 2017-07-06 | Disposition: A | Discharge status: Home / Self Care | Payer: Medicare Other | Source: Ambulatory Visit | Attending: Internal Medicine | Admitting: Internal Medicine

## 2017-07-06 DIAGNOSIS — Z1231 Encounter for screening mammogram for malignant neoplasm of breast: Secondary | ICD-10-CM

## 2017-07-07 ENCOUNTER — Other Ambulatory Visit: Payer: Self-pay | Admitting: Internal Medicine

## 2017-07-07 DIAGNOSIS — R928 Other abnormal and inconclusive findings on diagnostic imaging of breast: Secondary | ICD-10-CM

## 2017-07-10 ENCOUNTER — Other Ambulatory Visit: Payer: BLUE CROSS/BLUE SHIELD

## 2017-07-15 ENCOUNTER — Other Ambulatory Visit: Payer: Self-pay | Admitting: Internal Medicine

## 2017-07-15 ENCOUNTER — Ambulatory Visit
Admission: RE | Admit: 2017-07-15 | Discharge: 2017-07-15 | Disposition: A | Discharge status: Home / Self Care | Payer: Medicare Other | Source: Ambulatory Visit | Attending: Internal Medicine | Admitting: Internal Medicine

## 2017-07-15 DIAGNOSIS — R928 Other abnormal and inconclusive findings on diagnostic imaging of breast: Secondary | ICD-10-CM

## 2017-07-15 DIAGNOSIS — N6489 Other specified disorders of breast: Secondary | ICD-10-CM | POA: Diagnosis not present

## 2017-08-31 ENCOUNTER — Ambulatory Visit (INDEPENDENT_AMBULATORY_CARE_PROVIDER_SITE_OTHER): Payer: Medicare Other | Admitting: Podiatry

## 2017-08-31 ENCOUNTER — Encounter: Payer: Self-pay | Admitting: Podiatry

## 2017-08-31 DIAGNOSIS — M79676 Pain in unspecified toe(s): Secondary | ICD-10-CM

## 2017-08-31 DIAGNOSIS — B351 Tinea unguium: Secondary | ICD-10-CM | POA: Diagnosis not present

## 2017-08-31 DIAGNOSIS — G629 Polyneuropathy, unspecified: Secondary | ICD-10-CM | POA: Diagnosis not present

## 2017-08-31 DIAGNOSIS — Q828 Other specified congenital malformations of skin: Secondary | ICD-10-CM

## 2017-08-31 NOTE — Progress Notes (Signed)
Patient ID: Cassidy [name], female   DOB: 08/22/1940, 77 y.o.   MRN: [id number]  Subjective: Cassidy [name] presents the office today for concerns of thick painful calluses to both of her feet which are painful when she walks as well as toenails which get painful. She denies any redness or drainage from along the area.  She is unable to trim them herself. No other complaints at this time in no acute changes since last appointment.   Objective: AAO 3, NAD DP/PT pulses are palpable, CRT less than 3 seconds  Protective sensation absent with Derrel Nip monofilament Hyperkeratotic lesion present right plantar hallux. Hyperkeratotic lesions were also present medial first MTPJ on the left and submetatarsal 5 bilaterally and left submetatarsal 2. Upon debridement was lesions no underlying ulceration, drainage, or signs of infection.  Nails are hypertrophic, dystrophic, brittle, discolored 9. There is tenderness palpation to the nails.  Nails 1-5 bilaterally except for the second digit which has a previous partial amputation.  No other open lesions or pre-ulcer lesions identified bilaterally No pain with calf compression, swelling, warmth, erythema.  Assessment: 77 year old female with pre-ulcerative calluses bilaterally, symptomatic onychomycosis  Plan: -Treatment options discussed including all alternatives, risks, and complications -Nail sharply debrided 9 without complications/bleeding -Hyperkeratotic lesion sharply debrided 4 without complications -Discussed the importance of daily foot inspection -Follow-up 3 months or sooner if any problems arise. In the meantime, encouraged to call the office with any questions, concerns, change in symptoms.   Celesta Gentile, DPM

## 2017-10-07 ENCOUNTER — Other Ambulatory Visit: Payer: Self-pay | Admitting: Internal Medicine

## 2017-10-30 DIAGNOSIS — L089 Local infection of the skin and subcutaneous tissue, unspecified: Secondary | ICD-10-CM | POA: Diagnosis not present

## 2017-10-30 DIAGNOSIS — S71102A Unspecified open wound, left thigh, initial encounter: Secondary | ICD-10-CM | POA: Diagnosis not present

## 2017-11-02 ENCOUNTER — Ambulatory Visit (INDEPENDENT_AMBULATORY_CARE_PROVIDER_SITE_OTHER): Payer: Medicare Other | Admitting: Podiatry

## 2017-11-02 ENCOUNTER — Encounter: Payer: Self-pay | Admitting: Podiatry

## 2017-11-02 DIAGNOSIS — B351 Tinea unguium: Secondary | ICD-10-CM | POA: Diagnosis not present

## 2017-11-02 DIAGNOSIS — M79676 Pain in unspecified toe(s): Secondary | ICD-10-CM | POA: Diagnosis not present

## 2017-11-02 DIAGNOSIS — Q828 Other specified congenital malformations of skin: Secondary | ICD-10-CM

## 2017-11-02 DIAGNOSIS — G629 Polyneuropathy, unspecified: Secondary | ICD-10-CM

## 2017-11-03 NOTE — Progress Notes (Signed)
Patient ID: Cassidy [name], female   DOB: 01/28/1941, 77 y.o.   MRN: [id number]  Subjective: Cassidy [name] presents the office today for concerns of thick painful toenails and calluses to both of her feet which are painful when she walks. She denies any redness or drainage from along the area.  No other complaints at this time in no acute changes since last appointment.   Objective: AAO 3, NAD DP/PT pulses are palpable, CRT less than 3 seconds  Protective sensation absent with Derrel Nip monofilament Hyperkeratotic lesion present right plantar hallux. Hyperkeratotic lesions were also present medial first MTPJ on the left and submetatarsal 5 bilaterally and left submetatarsal 2. Upon debridement was lesions no underlying ulceration, drainage, or signs of infection.  Nails are hypertrophic, dystrophic, brittle, discolored 9. There is tenderness palpation to the nails.  Nails 1-5 bilaterally except for the second digit which has a previous partial amputation.  No other open lesions or pre-ulcer lesions identified bilaterally No pain with calf compression, swelling, warmth, erythema.  Assessment: 77 year old female with pre-ulcerative calluses bilaterally, symptomatic onychomycosis  Plan: -Treatment options discussed including all alternatives, risks, and complications -Nail sharply debrided 9 without complications/bleeding -Hyperkeratotic lesion sharply debrided 4 without complications -Discussed the importance of daily foot inspection -Follow-up 3 months or sooner if any problems arise. In the meantime, encouraged to call the office with any questions, concerns, change in symptoms.   Celesta Gentile, DPM

## 2017-11-26 ENCOUNTER — Other Ambulatory Visit: Payer: Medicare Other | Admitting: Internal Medicine

## 2017-11-30 ENCOUNTER — Encounter: Payer: Medicare Other | Admitting: Internal Medicine

## 2018-01-04 ENCOUNTER — Ambulatory Visit (INDEPENDENT_AMBULATORY_CARE_PROVIDER_SITE_OTHER): Payer: Medicare Other | Admitting: Podiatry

## 2018-01-04 ENCOUNTER — Encounter: Payer: Self-pay | Admitting: Podiatry

## 2018-01-04 DIAGNOSIS — M79676 Pain in unspecified toe(s): Secondary | ICD-10-CM | POA: Diagnosis not present

## 2018-01-04 DIAGNOSIS — G629 Polyneuropathy, unspecified: Secondary | ICD-10-CM | POA: Diagnosis not present

## 2018-01-04 DIAGNOSIS — B351 Tinea unguium: Secondary | ICD-10-CM | POA: Diagnosis not present

## 2018-01-04 DIAGNOSIS — Q828 Other specified congenital malformations of skin: Secondary | ICD-10-CM

## 2018-01-04 NOTE — Progress Notes (Signed)
Patient ID: Cassidy [name], female   DOB: Sep 12, 1940, 77 y.o.   MRN: [id number]  Subjective: Cassidy [name], 77 year old female,  presents the office today for concerns of thick painful toenails and calluses to both of her feet. She does state the nails and calluses do become painful. She denies any redness or drainage from along the area.  No other complaints at this time in no acute changes since last appointment.   Objective: AAO 3, NAD DP/PT pulses are palpable, CRT less than 3 seconds  Protective sensation absent with Derrel Nip monofilament Hyperkeratotic lesion present right plantar hallux. Hyperkeratotic lesions were also present medial first MTPJ on the left and submetatarsal 5 bilaterally and left submetatarsal 2. Upon debridement was lesions no underlying ulceration, drainage, or signs of infection.  Nails are hypertrophic, dystrophic, brittle, discolored 9. There is tenderness palpation to the nails.  Nails 1-5 bilaterally except for the second digit which has a previous partial amputation.  No other open lesions or pre-ulcer lesions identified bilaterally No pain with calf compression, swelling, warmth, erythema.  Assessment: 77 year old female with pre-ulcerative calluses bilaterally, symptomatic onychomycosis  Plan: -Treatment options discussed including all alternatives, risks, and complications -Nail sharply debrided 9 without complications/bleeding -Hyperkeratotic lesion sharply debrided 4 without complications -Discussed the importance of daily foot inspection -Follow-up 3 months or sooner if any problems arise. In the meantime, encouraged to call the office with any questions, concerns, change in symptoms.   Celesta Gentile, DPM

## 2018-01-07 ENCOUNTER — Telehealth: Payer: Self-pay

## 2018-01-07 NOTE — Telephone Encounter (Signed)
Left message to call back she is over due for a physical.

## 2018-01-18 ENCOUNTER — Other Ambulatory Visit: Payer: Self-pay | Admitting: Internal Medicine

## 2018-01-18 ENCOUNTER — Ambulatory Visit
Admission: RE | Admit: 2018-01-18 | Discharge: 2018-01-18 | Disposition: A | Discharge status: Home / Self Care | Payer: Medicare Other | Source: Ambulatory Visit | Attending: Internal Medicine | Admitting: Internal Medicine

## 2018-01-18 DIAGNOSIS — N6489 Other specified disorders of breast: Secondary | ICD-10-CM

## 2018-02-04 ENCOUNTER — Other Ambulatory Visit: Payer: Self-pay | Admitting: Internal Medicine

## 2018-02-04 DIAGNOSIS — I10 Essential (primary) hypertension: Secondary | ICD-10-CM

## 2018-02-04 DIAGNOSIS — M8580 Other specified disorders of bone density and structure, unspecified site: Secondary | ICD-10-CM

## 2018-02-04 DIAGNOSIS — Z8619 Personal history of other infectious and parasitic diseases: Secondary | ICD-10-CM

## 2018-02-04 DIAGNOSIS — Z85820 Personal history of malignant melanoma of skin: Secondary | ICD-10-CM

## 2018-02-04 DIAGNOSIS — E8881 Metabolic syndrome: Secondary | ICD-10-CM

## 2018-02-04 DIAGNOSIS — Z86711 Personal history of pulmonary embolism: Secondary | ICD-10-CM

## 2018-02-04 DIAGNOSIS — E119 Type 2 diabetes mellitus without complications: Secondary | ICD-10-CM

## 2018-02-04 DIAGNOSIS — E6609 Other obesity due to excess calories: Secondary | ICD-10-CM

## 2018-02-04 DIAGNOSIS — E039 Hypothyroidism, unspecified: Secondary | ICD-10-CM

## 2018-02-08 ENCOUNTER — Other Ambulatory Visit: Payer: Medicare Other | Admitting: Internal Medicine

## 2018-02-08 DIAGNOSIS — M8580 Other specified disorders of bone density and structure, unspecified site: Secondary | ICD-10-CM | POA: Diagnosis not present

## 2018-02-08 DIAGNOSIS — E119 Type 2 diabetes mellitus without complications: Secondary | ICD-10-CM | POA: Diagnosis not present

## 2018-02-08 DIAGNOSIS — E8881 Metabolic syndrome: Secondary | ICD-10-CM | POA: Diagnosis not present

## 2018-02-08 DIAGNOSIS — E039 Hypothyroidism, unspecified: Secondary | ICD-10-CM

## 2018-02-08 DIAGNOSIS — Z8619 Personal history of other infectious and parasitic diseases: Secondary | ICD-10-CM | POA: Diagnosis not present

## 2018-02-08 DIAGNOSIS — Z85820 Personal history of malignant melanoma of skin: Secondary | ICD-10-CM | POA: Diagnosis not present

## 2018-02-08 DIAGNOSIS — E6609 Other obesity due to excess calories: Secondary | ICD-10-CM | POA: Diagnosis not present

## 2018-02-08 DIAGNOSIS — Z86711 Personal history of pulmonary embolism: Secondary | ICD-10-CM

## 2018-02-08 DIAGNOSIS — I10 Essential (primary) hypertension: Secondary | ICD-10-CM

## 2018-02-10 ENCOUNTER — Ambulatory Visit (INDEPENDENT_AMBULATORY_CARE_PROVIDER_SITE_OTHER): Payer: Medicare Other | Admitting: Internal Medicine

## 2018-02-10 ENCOUNTER — Encounter: Payer: Self-pay | Admitting: Internal Medicine

## 2018-02-10 VITALS — BP 140/80 | HR 80 | Wt 222.0 lb

## 2018-02-10 DIAGNOSIS — Z85820 Personal history of malignant melanoma of skin: Secondary | ICD-10-CM | POA: Diagnosis not present

## 2018-02-10 DIAGNOSIS — Z0000 Encounter for general adult medical examination without abnormal findings: Secondary | ICD-10-CM

## 2018-02-10 DIAGNOSIS — R82998 Other abnormal findings in urine: Secondary | ICD-10-CM

## 2018-02-10 DIAGNOSIS — N390 Urinary tract infection, site not specified: Secondary | ICD-10-CM

## 2018-02-10 DIAGNOSIS — Z23 Encounter for immunization: Secondary | ICD-10-CM | POA: Diagnosis not present

## 2018-02-10 DIAGNOSIS — M8580 Other specified disorders of bone density and structure, unspecified site: Secondary | ICD-10-CM

## 2018-02-10 DIAGNOSIS — I10 Essential (primary) hypertension: Secondary | ICD-10-CM

## 2018-02-10 DIAGNOSIS — Z6839 Body mass index (BMI) 39.0-39.9, adult: Secondary | ICD-10-CM | POA: Diagnosis not present

## 2018-02-10 DIAGNOSIS — E8881 Metabolic syndrome: Secondary | ICD-10-CM

## 2018-02-10 DIAGNOSIS — E119 Type 2 diabetes mellitus without complications: Secondary | ICD-10-CM

## 2018-02-10 DIAGNOSIS — E039 Hypothyroidism, unspecified: Secondary | ICD-10-CM | POA: Diagnosis not present

## 2018-02-10 LAB — COMPLETE METABOLIC PANEL WITH GFR
AG Ratio: 1.6 (calc) (ref 1.0–2.5)
ALBUMIN MSPROF: 4.5 g/dL (ref 3.6–5.1)
ALT: 15 U/L (ref 6–29)
AST: 19 U/L (ref 10–35)
Alkaline phosphatase (APISO): 85 U/L (ref 33–130)
BILIRUBIN TOTAL: 0.5 mg/dL (ref 0.2–1.2)
BUN: 18 mg/dL (ref 7–25)
CALCIUM: 10 mg/dL (ref 8.6–10.4)
CO2: 29 mmol/L (ref 20–32)
CREATININE: 0.87 mg/dL (ref 0.60–0.93)
Chloride: 104 mmol/L (ref 98–110)
GFR, EST AFRICAN AMERICAN: 74 mL/min/{1.73_m2} (ref >=60)
GFR, EST NON AFRICAN AMERICAN: 64 mL/min/{1.73_m2} (ref >=60)
GLUCOSE: 99 mg/dL (ref 65–99)
Globulin: 2.9 g/dL (calc) (ref 1.9–3.7)
Potassium: 4.5 mmol/L (ref 3.5–5.3)
Sodium: 141 mmol/L (ref 135–146)
TOTAL PROTEIN: 7.4 g/dL (ref 6.1–8.1)

## 2018-02-10 LAB — CBC WITH DIFFERENTIAL/PLATELET
BASOS ABS: 62 {cells}/uL (ref 0–200)
Basophils Relative: 0.9 %
EOS ABS: 193 {cells}/uL (ref 15–500)
EOS PCT: 2.8 %
HEMATOCRIT: 41.9 % (ref 35.0–45.0)
HEMOGLOBIN: 13.8 g/dL (ref 11.7–15.5)
LYMPHS ABS: 2270 {cells}/uL (ref 850–3900)
MCH: 29.2 pg (ref 27.0–33.0)
MCHC: 32.9 g/dL (ref 32.0–36.0)
MCV: 88.8 fL (ref 80.0–100.0)
MPV: 10.4 fL (ref 7.5–12.5)
Monocytes Relative: 8.4 %
NEUTROS ABS: 3795 {cells}/uL (ref 1500–7800)
Neutrophils Relative %: 55 %
Platelets: 251 10*3/uL (ref 140–400)
RBC: 4.72 10*6/uL (ref 3.80–5.10)
RDW: 12.9 % (ref 11.0–15.0)
Total Lymphocyte: 32.9 %
WBC: 6.9 10*3/uL (ref 3.8–10.8)
WBCMIX: 580 {cells}/uL (ref 200–950)

## 2018-02-10 LAB — MICROALBUMIN / CREATININE URINE RATIO
Creatinine, Urine: 133 mg/dL (ref 20–275)
MICROALB UR: 23.4 mg/dL
Microalb Creat Ratio: 176 mcg/mg creat — ABNORMAL HIGH (ref <30)

## 2018-02-10 LAB — LIPID PANEL
Cholesterol: 203 mg/dL — ABNORMAL HIGH (ref <200)
HDL: 45 mg/dL — ABNORMAL LOW (ref >50)
LDL Cholesterol (Calc): 133 mg/dL (calc) — ABNORMAL HIGH
NON-HDL CHOLESTEROL (CALC): 158 mg/dL — AB (ref <130)
TRIGLYCERIDES: 136 mg/dL (ref <150)
Total CHOL/HDL Ratio: 4.5 (calc) (ref <5.0)

## 2018-02-10 LAB — HEMOGLOBIN A1C
HEMOGLOBIN A1C: 5.6 %{Hb} (ref <5.7)
Mean Plasma Glucose: 114 (calc)
eAG (mmol/L): 6.3 (calc)

## 2018-02-10 LAB — TSH: TSH: 1.36 m[IU]/L (ref 0.40–4.50)

## 2018-02-10 MED ORDER — CIPROFLOXACIN HCL 500 MG PO TABS: 500.0000 mg | ORAL_TABLET | Freq: Two times a day (BID) | ORAL | 0 refills | Status: DC

## 2018-02-10 NOTE — Patient Instructions (Addendum)
Cipro 500 mg twice a day x 7days. RTC in one week for dipstick U/A and nurse visit. Continue same meds and RTC in 6 months. Pt understands Medicare requires annual visit at minimal. Pneumococcal 23 given.

## 2018-02-10 NOTE — Progress Notes (Signed)
Subjective:    Patient ID: Cassidy [name], female    DOB: [birth year], 77 y.o.   MRN: [id number]  HPI 77 year old Female for Medicare Wellness, health maintenance exam and evaluation of medical issues.  Has abnormal dipstick urine with 2+ LE.  Typically has leukocytes in urine and is asymptomatic.  I am going to treat her with a course of Cipro for 7 days and she should follow-up after that with nurse visit and urine specimen.  LDL slightly elevated at 133 and was 125 last year. Has low HDL.  Pneumococcal 23 vaccine given today  Her total cholesterol is 203 and was 190-year ago.  She has a low HDL of 45.  LDL is 33 and last year was 125.  TSH is normal.  Hemoglobin A1c is normal.  CBC and C met are normal.  Had mammogram in March with some left asymmetry noted.  She is to return in 6 months.  Prior to that she had not had a mammogram since 2014.  Had previous colonoscopy done by Dr. Collene Mares in 2003 and another colonoscopy in 2007 at [facility].  In 2013 she had amputation of left second toe due to a benign mass by Dr. Jennet Maduro.  She has a history of onychomycosis and sees podiatrist.  She has to keep her legs wrapped due to chronic dependent oedema.  She has less edema than in years past but she keeps her legs wrapped to control that.  She says she sees GYN physician for gynecology exam.  Arthroscopic surgery for torn ligaments right knee June 2003.  Right knee arthroplasty July 2010.  Subsequently developed bilateral pulmonary emboli after her arthroplasty.  Right ganglion cyst surgery 2008.  Left ankle surgery 2005 involving the bone graft.  Ovarian cystectomy 1968, hysterectomy 1982, rectocele repair 2009.  Cholecystectomy 2003.  In 2015 she had melanoma right back requiring wide excision.  In 2016 she had melanoma excised from right foot.  Also at that time she had dysplastic nevus of the right shoulder which she underwent wide excision 4.  In March 2016 she  developed redness swelling and drainage from previously healed wounds of right foot and ankle.  She was placed on antibiotics and DVT was ruled out.  She was seen at [facility] diagnosed with delayed surgical wound healing wound was treated there with the wound care center for several months has been told to have frequent checks for melanoma by dermatologist.  A Cardiolite study done at [facility] heart care April 2009 which was normal.  She has hypothyroidism, hypertension, osteopenia, hyperlipidemia, dependent edema, impaired glucose tolerance.  She remains obese.  Not able to exercise.  History of urinary tract infections which improved status post rectocele surgery/colporrhaphy.  She had normal colposcopy prior to surgery.  She previously had a vaginal hysterectomy for fibroids and bleeding.  She was diagnosed with a grade 3 rectocele with mild cystocele.  And had issues with rectocele since 2007.  Cystocele did not warrant surgical correction.  Social history: Divorced.  Does not smoke.  Social alcohol consumption.  Lives alone.  2 adult children.  Daughter with history of breast cancer doing well.  3 grandchildren.  Family history: Father died at age 49 of a sudden MI.  Mother died at age 76 of heart problems.  One brother deceased in an airplane crash at age 7.  2 sisters in good health.    Review of Systems no new complaints  Objective:   Physical Exam  Constitutional: She is oriented to person, place, and time. She appears well-developed and well-nourished. No distress.  HENT:  Head: Normocephalic and atraumatic.  Right Ear: External ear normal.  Left Ear: External ear normal.  Mouth/Throat: Oropharynx is clear and moist.  Eyes: Pupils are equal, round, and reactive to light. Conjunctivae and EOM are normal. Right eye exhibits no discharge. Left eye exhibits no discharge.  Neck: Neck supple. No JVD present. No thyromegaly present.  Cardiovascular: Normal  rate, regular rhythm and normal heart sounds.  No murmur heard. Pulmonary/Chest: Effort normal and breath sounds normal. No stridor. No respiratory distress. She has no wheezes. She has no rales.  Breast without masses  Abdominal: Soft. Bowel sounds are normal. She exhibits no distension and no mass. There is no tenderness. There is no rebound and no guarding.  Genitourinary:  Genitourinary Comments: Deferred  Musculoskeletal: She exhibits no edema.  Lymphadenopathy:    She has no cervical adenopathy.  Neurological: She is alert and oriented to person, place, and time. She displays normal reflexes. No cranial nerve deficit. Coordination normal.  Skin: Skin is warm and dry. She is not diaphoretic.  Psychiatric: She has a normal mood and affect. Her behavior is normal. Thought content normal.  Vitals reviewed.         Assessment & Plan:  Obesity-unable really to exercise due to weight issues with lower extremity edema of legs  Impaired glucose tolerance-stable with diet  Hypertension-stable  Hypothyroidism-stable on current dose of thyroid replacement  Hyperlipidemia  Metabolic syndrome  Osteopenia  History of abnormal mammogram  and will be returning in 6 months for follow-up  Significant dependent edema requiring legs to be chronically wrapped  Onychomycosis seen by podiatrist  History of melanoma  Abnormal urine specimen  Plan: Take Cipro for 7 days and follow-up here with nurse visit in 7 to 10 days.  Continue current medications.  Pneumococcal vaccine given today.  First yearly.  Subjective:   Patient presents for Medicare Annual/Subsequent preventive examination.  Review Past Medical/Family/Social:   Risk Factors  Current exercise habits:  Dietary issues discussed:   Cardiac risk factors: Family history in both mother and father.  Hyperlipidemia.  Obesity.  Depression Screen  (Note: if answer to either of the following is "Yes", a more complete  depression screening is indicated)   Over the past two weeks, have you felt down, depressed or hopeless? No  Over the past two weeks, have you felt little interest or pleasure in doing things? No Have you lost interest or pleasure in daily life? No Do you often feel hopeless? No Do you cry easily over simple problems? No   Activities of Daily Living  In your present state of health, do you have any difficulty performing the following activities?:   Driving? No  Managing money? No  Feeding yourself? No  Getting from bed to chair? No  Climbing a flight of stairs? No  Preparing food and eating?: No  Bathing or showering? No  Getting dressed: No  Getting to the toilet? No  Using the toilet:No  Moving around from place to place: Yes due to obesity and dependent edema as well as foot issues In the past year have you fallen or had a near fall?:No  Are you sexually active? No  Do you have more than one partner? No   Hearing Difficulties: No  Do you often ask people to speak up or repeat themselves? No  Do you experience  ringing or noises in your ears? No  Do you have difficulty understanding soft or whispered voices? No  Do you feel that you have a problem with memory? No Do you often misplace items? No    Home Safety:  Do you have a smoke alarm at your residence? Yes Do you have grab bars in the bathroom? Do you have throw rugs in your house?   Cognitive Testing  Alert? Yes Normal Appearance?Yes  Oriented to person? Yes Place? Yes  Time? Yes  Recall of three objects?  Not tested Can perform simple calculations?  Not tested Displays appropriate judgment?Yes  Can read the correct time from a watch face?Yes   List the Names of Other Physician/Practitioners you currently use:  See referral list for the physicians patient is currently seeing.     Review of Systems: See above   Objective:     General appearance: Appears stated age and  obese  Head: Normocephalic,  without obvious abnormality, atraumatic  Eyes: conj clear, EOMi PEERLA  Ears: normal TM's and external ear canals both ears  Nose: Nares normal. Septum midline. Mucosa normal. No drainage or sinus tenderness.  Throat: lips, mucosa, and tongue normal; teeth and gums normal  Neck: no adenopathy, no carotid bruit, no JVD, supple, symmetrical, trachea midline and thyroid not enlarged, symmetric, no tenderness/mass/nodules  No CVA tenderness.  Lungs: clear to auscultation bilaterally  Breasts: normal appearance no discrete masses Heart: regular rate and rhythm, S1, S2 normal, no murmur, click, rub or gallop  Abdomen: soft, non-tender; bowel sounds normal; no masses, no organomegaly  Musculoskeletal: Ambulates slowly Skin: Skin color, texture, turgor normal. No rashes or lesions  Lymph nodes: Cervical, supraclavicular, and axillary nodes normal.  Neurologic: CN 2 -12 Normal, Normal symmetric reflexes. Normal coordination and gait  Psych: Alert & Oriented x 3, Mood appear stable.    Assessment:    Annual wellness medicare exam   Plan:    During the course of the visit the patient was educated and counseled about appropriate screening and preventive services including:  Follow-up mammogram  Pneumococcal 23 vaccine given      Patient Instructions (the written plan) was given to the patient.  Medicare Attestation  I have personally reviewed:  The patient's medical and social history  Their use of alcohol, tobacco or illicit drugs  Their current medications and supplements  The patient's functional ability including ADLs,fall risks, home safety risks, cognitive, and hearing and visual impairment  Diet and physical activities  Evidence for depression or mood disorders  The patient's weight, height, BMI, and visual acuity have been recorded in the chart. I have made referrals, counseling, and provided education to the patient based on review of the above and I have provided the patient  with a written personalized care plan for preventive services.

## 2018-02-11 LAB — POCT URINALYSIS DIPSTICK
Bilirubin, UA: NEGATIVE
Glucose, UA: NEGATIVE
KETONES UA: NEGATIVE
NITRITE UA: NEGATIVE
PROTEIN UA: NEGATIVE
Spec Grav, UA: <=1.005 — AB (ref 1.010–1.025)
UROBILINOGEN UA: NEGATIVE U/dL — AB
pH, UA: 6 (ref 5.0–8.0)

## 2018-03-08 ENCOUNTER — Encounter: Payer: Self-pay | Admitting: Podiatry

## 2018-03-08 ENCOUNTER — Ambulatory Visit (INDEPENDENT_AMBULATORY_CARE_PROVIDER_SITE_OTHER): Payer: Medicare Other | Admitting: Podiatry

## 2018-03-08 DIAGNOSIS — Q828 Other specified congenital malformations of skin: Secondary | ICD-10-CM | POA: Diagnosis not present

## 2018-03-08 DIAGNOSIS — M79676 Pain in unspecified toe(s): Secondary | ICD-10-CM | POA: Diagnosis not present

## 2018-03-08 DIAGNOSIS — G629 Polyneuropathy, unspecified: Secondary | ICD-10-CM | POA: Diagnosis not present

## 2018-03-08 DIAGNOSIS — B351 Tinea unguium: Secondary | ICD-10-CM

## 2018-03-08 NOTE — Progress Notes (Signed)
Patient ID: Cassidy [name], female   DOB: 11-16-1940, 78 y.o.   MRN: [id number]  Subjective: Cassidy [name], 77 year old female,  presents the office today for concerns of thick painful toenails and calluses to both of her feet. She denies any redness or drainage from along the area.  No other complaints at this time in no acute changes since last appointment.   Objective: AAO 3, NAD DP/PT pulses are palpable, CRT less than 3 seconds  Protective sensation absent with Derrel Nip monofilament Hyperkeratotic lesion present right plantar hallux. Hyperkeratotic lesions were also present medial first MTPJ on the left and submetatarsal 5 bilaterally and left submetatarsal 2. Upon debridement was lesions no underlying ulceration, drainage, or signs of infection.  Nails are hypertrophic, dystrophic, brittle, discolored 9. There is tenderness palpation to the nails.  Nails 1-5 bilaterally except for the second digit which has a previous partial amputation.  No other open lesions or pre-ulcer lesions identified bilaterally No pain with calf compression, swelling, warmth, erythema. Overall, doing well without any changes  Assessment: 77 year old female with pre-ulcerative calluses bilaterally, symptomatic onychomycosis  Plan: -Treatment options discussed including all alternatives, risks, and complications -Nail sharply debrided 9 without complications/bleeding -Hyperkeratotic lesion sharply debrided 4 without complications -Discussed the importance of daily foot inspection -Follow-up 3 months or sooner if any problems arise. In the meantime, encouraged to call the office with any questions, concerns, change in symptoms.   Celesta Gentile, DPM

## 2018-04-08 DIAGNOSIS — M25561 Pain in right knee: Secondary | ICD-10-CM | POA: Diagnosis not present

## 2018-05-10 ENCOUNTER — Ambulatory Visit (INDEPENDENT_AMBULATORY_CARE_PROVIDER_SITE_OTHER): Payer: Medicare Other | Admitting: Podiatry

## 2018-05-10 ENCOUNTER — Encounter: Payer: Self-pay | Admitting: Podiatry

## 2018-05-10 DIAGNOSIS — Q828 Other specified congenital malformations of skin: Secondary | ICD-10-CM | POA: Diagnosis not present

## 2018-05-10 DIAGNOSIS — B351 Tinea unguium: Secondary | ICD-10-CM | POA: Diagnosis not present

## 2018-05-10 DIAGNOSIS — G629 Polyneuropathy, unspecified: Secondary | ICD-10-CM

## 2018-05-10 DIAGNOSIS — M79676 Pain in unspecified toe(s): Secondary | ICD-10-CM | POA: Diagnosis not present

## 2018-05-10 DIAGNOSIS — R7301 Impaired fasting glucose: Secondary | ICD-10-CM | POA: Insufficient documentation

## 2018-05-17 NOTE — Progress Notes (Signed)
Patient ID: Cassidy [name], female   DOB: 02-08-1941, 78 y.o.   MRN: [id number]  Subjective: Ms. Tomlinson, 78 year old female,  presents the office today for concerns of thick painful toenails and calluses to both of her feet. She denies any redness or drainage from along the area.  No other complaints at this time in no acute changes since last appointment.   Objective: AAO 3, NAD DP/PT pulses are palpable, CRT less than 3 seconds  Protective sensation absent with Derrel Nip monofilament Hyperkeratotic lesion present right plantar hallux. Hyperkeratotic lesions were also present medial first MTPJ on the left and submetatarsal 5 bilaterally and left submetatarsal 2. Upon debridement was lesions no underlying ulceration, drainage, or signs of infection.  Nails are hypertrophic, dystrophic, brittle, discolored 9. There is tenderness palpation to the nails.  Nails 1-5 bilaterally except for the second digit which has a previous partial amputation.  No pain with calf compression, swelling, warmth, erythema.  Assessment: 78 year old female with pre-ulcerative calluses bilaterally, symptomatic onychomycosis  Plan: -Treatment options discussed including all alternatives, risks, and complications -Nail sharply debrided 9 without complications/bleeding -Hyperkeratotic lesion sharply debrided 4 without complications -Discussed the importance of daily foot inspection -Follow-up 3 months or sooner if any problems arise. In the meantime, encouraged to call the office with any questions, concerns, change in symptoms.   Celesta Gentile, DPM

## 2018-06-21 ENCOUNTER — Other Ambulatory Visit: Payer: Self-pay | Admitting: Internal Medicine

## 2018-07-12 ENCOUNTER — Ambulatory Visit: Payer: Medicare Other | Admitting: Podiatry

## 2018-07-19 ENCOUNTER — Inpatient Hospital Stay: Admission: RE | Admit: 2018-07-19 | Payer: Medicare Other | Source: Ambulatory Visit

## 2018-08-23 ENCOUNTER — Other Ambulatory Visit: Payer: Self-pay

## 2018-08-23 ENCOUNTER — Ambulatory Visit (INDEPENDENT_AMBULATORY_CARE_PROVIDER_SITE_OTHER): Payer: Medicare Other | Admitting: Podiatry

## 2018-08-23 ENCOUNTER — Encounter: Payer: Self-pay | Admitting: Podiatry

## 2018-08-23 VITALS — Temp 97.5°F

## 2018-08-23 DIAGNOSIS — B351 Tinea unguium: Secondary | ICD-10-CM

## 2018-08-23 DIAGNOSIS — Q828 Other specified congenital malformations of skin: Secondary | ICD-10-CM

## 2018-08-23 DIAGNOSIS — G629 Polyneuropathy, unspecified: Secondary | ICD-10-CM | POA: Diagnosis not present

## 2018-08-23 DIAGNOSIS — M79676 Pain in unspecified toe(s): Secondary | ICD-10-CM

## 2018-08-23 NOTE — Progress Notes (Signed)
Patient ID: [name], female   DOB: 09-Nov-1940, 78 y.o.   MRN: [id number]  Subjective: Ms. Clare, 78 year old female,  presents the office today for concerns of thick painful toenails and calluses to both of her feet. She denies any redness or drainage from along the area.  No other complaints at this time in no acute changes since last appointment.   Objective: AAO 3, NAD DP/PT pulses are palpable, CRT less than 3 seconds  Protective sensation absent with Derrel Nip monofilament Hyperkeratotic lesion present right plantar hallux. Hyperkeratotic lesions were also present medial first MTPJ on the left and submetatarsal 5 bilaterally and left submetatarsal 2. Upon debridement was lesions no underlying ulceration, drainage, or signs of infection.  Nails are hypertrophic, dystrophic, brittle, discolored 9. There is tenderness palpation to the nails.  No skin lesions or hyperpigmented lesions No pain with calf compression, swelling, warmth, erythema.  Assessment: 78 year old female with pre-ulcerative calluses bilaterally, symptomatic onychomycosis  Plan: -Treatment options discussed including all alternatives, risks, and complications -Nail sharply debrided 9 without complications/bleeding -Hyperkeratotic lesion sharply debrided 4 without complications -Discussed the importance of daily foot inspection -Follow-up 3 months or sooner if any problems arise. In the meantime, encouraged to call the office with any questions, concerns, change in symptoms.   Celesta Gentile, DPM

## 2018-08-24 ENCOUNTER — Telehealth: Payer: Self-pay | Admitting: Internal Medicine

## 2018-08-24 NOTE — Telephone Encounter (Signed)
LVM to CB and schedule CPE and Labs °

## 2018-09-30 DIAGNOSIS — M1712 Unilateral primary osteoarthritis, left knee: Secondary | ICD-10-CM | POA: Diagnosis not present

## 2018-09-30 DIAGNOSIS — Z96651 Presence of right artificial knee joint: Secondary | ICD-10-CM | POA: Diagnosis not present

## 2018-09-30 DIAGNOSIS — M25551 Pain in right hip: Secondary | ICD-10-CM | POA: Diagnosis not present

## 2018-10-18 ENCOUNTER — Other Ambulatory Visit: Payer: Self-pay | Admitting: Internal Medicine

## 2018-11-01 ENCOUNTER — Encounter: Payer: Self-pay | Admitting: Podiatry

## 2018-11-01 ENCOUNTER — Other Ambulatory Visit: Payer: Self-pay

## 2018-11-01 ENCOUNTER — Ambulatory Visit (INDEPENDENT_AMBULATORY_CARE_PROVIDER_SITE_OTHER): Payer: Medicare Other | Admitting: Podiatry

## 2018-11-01 VITALS — Temp 98.3°F

## 2018-11-01 DIAGNOSIS — G629 Polyneuropathy, unspecified: Secondary | ICD-10-CM

## 2018-11-01 DIAGNOSIS — Q828 Other specified congenital malformations of skin: Secondary | ICD-10-CM | POA: Diagnosis not present

## 2018-11-01 DIAGNOSIS — M79676 Pain in unspecified toe(s): Secondary | ICD-10-CM

## 2018-11-01 DIAGNOSIS — B351 Tinea unguium: Secondary | ICD-10-CM | POA: Diagnosis not present

## 2018-11-03 NOTE — Progress Notes (Signed)
Patient ID: Cassidy [name], female   DOB: January 10, 1941, 78 y.o.   MRN: [id number]  Subjective: Cassidy [name], 78 year old female presents the office today for concerns of thick painful toenails and calluses to both of her feet. She denies any redness or drainage from along the area.  No other complaints at this time in no acute changes since last appointment.   Objective: AAO 3, NAD DP/PT pulses are palpable, CRT less than 3 seconds  Protective sensation absent with Derrel Nip monofilament Hyperkeratotic lesion present right plantar hallux. Hyperkeratotic lesions were also present medial first MTPJ on the left and submetatarsal 5 bilaterally and left submetatarsal 2. Upon debridement was lesions no underlying ulceration, drainage, or signs of infection.  Nails are hypertrophic, dystrophic, brittle, discolored 9. There is tenderness palpation to the nails.  No skin lesions or hyperpigmented lesions No pain with calf compression, swelling, warmth, erythema.  Assessment: 78 year old female with pre-ulcerative calluses bilaterally, symptomatic onychomycosis  Plan: -Treatment options discussed including all alternatives, risks, and complications -Nail sharply debrided 9 without complications/bleeding -Hyperkeratotic lesion sharply debrided 4 without complications -Discussed the importance of daily foot inspection -Follow-up 3 months or sooner if any problems arise. In the meantime, encouraged to call the office with any questions, concerns, change in symptoms.   Celesta Gentile, DPM

## 2018-11-19 ENCOUNTER — Telehealth: Payer: Self-pay | Admitting: Internal Medicine

## 2018-11-19 NOTE — Telephone Encounter (Signed)
LVM to Call office to schedule Surgery Clearance appointment. Surgery scheduled for 12/13/18

## 2018-11-22 NOTE — Telephone Encounter (Signed)
Scheduled appointment

## 2018-11-23 ENCOUNTER — Other Ambulatory Visit: Payer: Self-pay | Admitting: Orthopedic Surgery

## 2018-11-25 ENCOUNTER — Ambulatory Visit (INDEPENDENT_AMBULATORY_CARE_PROVIDER_SITE_OTHER): Payer: Medicare Other | Admitting: Internal Medicine

## 2018-11-25 ENCOUNTER — Encounter: Payer: Self-pay | Admitting: Internal Medicine

## 2018-11-25 ENCOUNTER — Other Ambulatory Visit: Payer: Self-pay

## 2018-11-25 VITALS — BP 130/88 | HR 101 | Temp 98.2°F | Ht 64.0 in | Wt 235.0 lb

## 2018-11-25 DIAGNOSIS — E049 Nontoxic goiter, unspecified: Secondary | ICD-10-CM

## 2018-11-25 DIAGNOSIS — R609 Edema, unspecified: Secondary | ICD-10-CM

## 2018-11-25 DIAGNOSIS — R8290 Unspecified abnormal findings in urine: Secondary | ICD-10-CM | POA: Diagnosis not present

## 2018-11-25 DIAGNOSIS — Z6841 Body Mass Index (BMI) 40.0 and over, adult: Secondary | ICD-10-CM

## 2018-11-25 DIAGNOSIS — Z86711 Personal history of pulmonary embolism: Secondary | ICD-10-CM | POA: Diagnosis not present

## 2018-11-25 DIAGNOSIS — F411 Generalized anxiety disorder: Secondary | ICD-10-CM

## 2018-11-25 DIAGNOSIS — Z01818 Encounter for other preprocedural examination: Secondary | ICD-10-CM

## 2018-11-25 DIAGNOSIS — E039 Hypothyroidism, unspecified: Secondary | ICD-10-CM

## 2018-11-25 DIAGNOSIS — R7302 Impaired glucose tolerance (oral): Secondary | ICD-10-CM | POA: Diagnosis not present

## 2018-11-25 DIAGNOSIS — I10 Essential (primary) hypertension: Secondary | ICD-10-CM

## 2018-11-25 DIAGNOSIS — E8881 Metabolic syndrome: Secondary | ICD-10-CM | POA: Diagnosis not present

## 2018-11-25 NOTE — Progress Notes (Signed)
Subjective:    Patient ID: Cassidy [name], female    DOB: 01/03/1941, 78 y.o.   MRN: [id number]  HPI 78 year old Female for medical clearance for left knee arthroplasty to be performed by Dr. Mayer Camel and planned for August 17.  She has a history of hypertension, hypothyroidism, osteopenia, dependent edema, hyperlipidemia and impaired glucose tolerance.  She had a Cardiolite study done in 2009 which was normal.  Past medical history arthroscopic surgery for torn ligaments right knee June 2003.  Right knee arthroplasty July 2010.  Subsequently developed bilateral pulmonary emboli after arthroplasty.  Right wrist ganglion cyst surgery 2008.  Left ankle surgery 2005 involving a bone graft.  Ovarian cystectomy 1968, hysterectomy 1982, rectocele repair 2009, cholecystectomy 2003.  In 2015 she had melanoma right back requiring a wide excision.  In 2016 she had melanoma excised from right foot.  At that time also had dysplastic nevus of the right shoulder for which she underwent a wide excision.  In March 2016 she had redness swelling and drainage from previously healed wounds of right foot and ankle from the melanoma excision.  She was placed on antibiotics.  DVT was ruled out.  She was seen at [facility], diagnosed with delayed surgical wound healing and was treated there at the wound care center for several months.  Since that time, she wraps her legs and she was shown how to do that at the wound care center.  She changes the wraps every 3 days.  This has helped with the dependent edema and she has had no further infections regarding her legs.  History of urinary infections in the past but underwent posterior colporrhapy GYN surgery in 2009  and now rarely has urinary tract infections.  Had vaginal hysterectomy prior to 2009 for fibroids and bleeding.  Hx of bilateral cataracts.  Social history: She is divorced.  Does not smoke.  Social alcohol consumption.  2 adult  children a son and a daughter.  3 grandchildren.  Family history: Father died at age 23 of a sudden MI.  Mother died at age 30 of heart problems.  One brother died in an air plane crash at age 56.  2 sisters in good health.                   Review of Systems  Respiratory: Negative.   Cardiovascular: Negative.   Gastrointestinal: Negative.   Genitourinary: Negative.   Neurological: Negative.   Psychiatric/Behavioral: Negative.         Objective:     Blood pressure 130/88, temperature 98.2 F, pulse ox 98%, weight 235 pounds, BMI 41.63  Skin warm and dry.  Nodes none.  TMs are clear.  Neck is supple without JVD thyromegaly or carotid bruits.  Chest clear to auscultation without rales or wheezing.  Cardiac exam regular rate and rhythm normal S1 and S2 without gallops or murmurs.  Abdomen obese soft nondistended without hepatosplenomegaly masses or tenderness.  Breasts normal female without masses.  Lower extremities are wrapped with Ace wraps bilaterally.  No significant pitting edema.  Neuro no focal deficits on brief neurological exam.       Assessment & Plan:  BMI 41.63-counseled regarding diet exercise and weight loss  History of bilateral pulmonary emboli  status post right knee arthroplasty in 2010.  Currently taking aspirin 81 mg daily.  History of anxiety treated with Xanax at bedtime as needed  Dependent edema treated with bilateral leg wraps  History  of hypertension treated with amlodipine and Lasix 40 mg daily  Hypothyroidism treated with Synthroid 0.1 mg daily  Hyperlipidemia- Hx elevated LDL  Hx low HDL cholesterol  Plan:EKG  With left axis deviation compared to previous EKG done 11/06/11 which had incomplete RBBB and left axis deviation. BMI elevated. Counseled regarding diet exercise and weight loss. Not really motivated to exercise. Some anxiety with this visit with tachycardia and elevated diastolic of 88. Dipstick urine specimen brought in  Friday August 7 is abnormal and will be cultured

## 2018-11-26 ENCOUNTER — Other Ambulatory Visit (INDEPENDENT_AMBULATORY_CARE_PROVIDER_SITE_OTHER): Payer: Medicare Other

## 2018-11-26 ENCOUNTER — Encounter: Payer: Self-pay | Admitting: Internal Medicine

## 2018-11-26 DIAGNOSIS — Z01818 Encounter for other preprocedural examination: Secondary | ICD-10-CM | POA: Diagnosis not present

## 2018-11-26 DIAGNOSIS — R8290 Unspecified abnormal findings in urine: Secondary | ICD-10-CM

## 2018-11-26 LAB — COMPLETE METABOLIC PANEL WITH GFR
AG Ratio: 1.6 (calc) (ref 1.0–2.5)
ALT: 24 U/L (ref 6–29)
AST: 25 U/L (ref 10–35)
Albumin: 4.4 g/dL (ref 3.6–5.1)
Alkaline phosphatase (APISO): 83 U/L (ref 37–153)
BUN: 13 mg/dL (ref 7–25)
CO2: 28 mmol/L (ref 20–32)
Calcium: 9.6 mg/dL (ref 8.6–10.4)
Chloride: 105 mmol/L (ref 98–110)
Creat: 0.79 mg/dL (ref 0.60–0.93)
GFR, Est African American: 83 mL/min/{1.73_m2} (ref >=60)
GFR, Est Non African American: 72 mL/min/{1.73_m2} (ref >=60)
Globulin: 2.7 g/dL (calc) (ref 1.9–3.7)
Glucose, Bld: 104 mg/dL — ABNORMAL HIGH (ref 65–99)
Potassium: 3.9 mmol/L (ref 3.5–5.3)
Sodium: 141 mmol/L (ref 135–146)
Total Bilirubin: 0.6 mg/dL (ref 0.2–1.2)
Total Protein: 7.1 g/dL (ref 6.1–8.1)

## 2018-11-26 LAB — CBC WITH DIFFERENTIAL/PLATELET
Absolute Monocytes: 607 cells/uL (ref 200–950)
Basophils Absolute: 59 cells/uL (ref 0–200)
Basophils Relative: 0.8 %
Eosinophils Absolute: 281 cells/uL (ref 15–500)
Eosinophils Relative: 3.8 %
HCT: 43.2 % (ref 35.0–45.0)
Hemoglobin: 14.4 g/dL (ref 11.7–15.5)
Lymphs Abs: 2753 cells/uL (ref 850–3900)
MCH: 30 pg (ref 27.0–33.0)
MCHC: 33.3 g/dL (ref 32.0–36.0)
MCV: 90 fL (ref 80.0–100.0)
MPV: 10.6 fL (ref 7.5–12.5)
Monocytes Relative: 8.2 %
Neutro Abs: 3700 cells/uL (ref 1500–7800)
Neutrophils Relative %: 50 %
Platelets: 256 10*3/uL (ref 140–400)
RBC: 4.8 10*6/uL (ref 3.80–5.10)
RDW: 12.7 % (ref 11.0–15.0)
Total Lymphocyte: 37.2 %
WBC: 7.4 10*3/uL (ref 3.8–10.8)

## 2018-11-26 LAB — POCT URINALYSIS DIPSTICK
Bilirubin, UA: NEGATIVE
Glucose, UA: NEGATIVE
Ketones, UA: NEGATIVE
Nitrite, UA: NEGATIVE
Protein, UA: NEGATIVE
Spec Grav, UA: 1.015 (ref 1.010–1.025)
Urobilinogen, UA: 0.2 E.U./dL
pH, UA: 6 (ref 5.0–8.0)

## 2018-11-26 LAB — HEMOGLOBIN A1C
Hgb A1c MFr Bld: 5.6 % of total Hgb (ref <5.7)
Mean Plasma Glucose: 114 (calc)
eAG (mmol/L): 6.3 (calc)

## 2018-11-26 LAB — TSH: TSH: 1.13 mIU/L (ref 0.40–4.50)

## 2018-11-26 NOTE — Patient Instructions (Signed)
Watch diet and try to walk some. BMI is elevated. Urine to be cultured. Monitor BP at home.

## 2018-11-27 LAB — URINE CULTURE
MICRO NUMBER:: 748476
SPECIMEN QUALITY:: ADEQUATE

## 2018-11-29 ENCOUNTER — Other Ambulatory Visit: Payer: Medicare Other | Admitting: Internal Medicine

## 2018-11-29 ENCOUNTER — Other Ambulatory Visit: Payer: Self-pay

## 2018-11-29 DIAGNOSIS — E785 Hyperlipidemia, unspecified: Secondary | ICD-10-CM | POA: Diagnosis not present

## 2018-11-29 DIAGNOSIS — Z01818 Encounter for other preprocedural examination: Secondary | ICD-10-CM

## 2018-11-29 LAB — LIPID PANEL
Cholesterol: 196 mg/dL (ref <200)
HDL: 48 mg/dL — ABNORMAL LOW (ref >=50)
LDL Cholesterol (Calc): 121 mg/dL (calc) — ABNORMAL HIGH
Non-HDL Cholesterol (Calc): 148 mg/dL (calc) — ABNORMAL HIGH (ref <130)
Total CHOL/HDL Ratio: 4.1 (calc) (ref <5.0)
Triglycerides: 159 mg/dL — ABNORMAL HIGH (ref <150)

## 2018-11-30 ENCOUNTER — Encounter (HOSPITAL_COMMUNITY): Payer: Self-pay

## 2018-11-30 NOTE — Patient Instructions (Addendum)
DUE TO COVID-19 ONLY ONE VISITOR IS ALLOWED TO VISIT DURING VISITOR HOURS ONLY!!   COVID SWAB TESTING MUST BE COMPLETED ON: Thursday, Aug. 13, 2020 at 3::30 PM   [address] [place] [facility] enter pre surgical testing line (Must self quarantine after testing. Follow instructions on handout.)             Your procedure is scheduled on: Monday, Aug. 17, 2020   Report to [facility] Main  Entrance    Report to admitting at 7:45 AM   Call this number if you have problems the morning of surgery [phone]   Do not eat food:After Midnight.   May have liquids until 7:15AM day of surgery   CLEAR LIQUID DIET  Foods Allowed                                                                     Foods Excluded  Water, Black Coffee and tea, regular and decaf                             liquids that you cannot  Plain Jell-O in any flavor  (No red)                                           see through such as: Fruit ices (not with fruit pulp)                                     milk, soups, orange juice  Iced Popsicles (No red)                                    All solid food Carbonated beverages, regular and diet                                    Apple juices Sports drinks like Gatorade (No red) Lightly seasoned clear broth or consume(fat free) Sugar, honey syrup  Sample Menu Breakfast                                Lunch                                     Supper Cranberry juice                    Beef broth                            Chicken broth Jell-O  Grape juice                           Apple juice Coffee or tea                        Jell-O                                      Popsicle                                                Coffee or tea                        Coffee or tea   Complete one Ensure drink the morning of surgery at  7:15AM the day of surgery.   Brush your teeth the morning of  surgery.   Do NOT smoke after Midnight   Take these medicines the morning of surgery with A SIP OF WATER: Amlodipine                               You may not have any metal on your body including hair pins, jewelry, and body piercings             Do not wear make-up, lotions, powders, perfumes/cologne, or deodorant             Do not wear nail polish.  Do not shave  48 hours prior to surgery.                Do not bring valuables to the hospital. [place].   Contacts, dentures or bridgework may not be worn into surgery.   Bring small overnight bag day of surgery.    Special Instructions: Bring a copy of your healthcare power of attorney and living will documents         the day of surgery if you haven't scanned them in before.              Please read over the following fact sheets you were given:  [facility] - Preparing for Surgery Before surgery, you can play an important role.  Because skin is not sterile, your skin needs to be as free of germs as possible.  You can reduce the number of germs on your skin by washing with CHG (chlorahexidine gluconate) soap before surgery.  CHG is an antiseptic cleaner which kills germs and bonds with the skin to continue killing germs even after washing. Please DO NOT use if you have an allergy to CHG or antibacterial soaps.  If your skin becomes reddened/irritated stop using the CHG and inform your nurse when you arrive at Short Stay. Do not shave (including legs and underarms) for at least 48 hours prior to the first CHG shower.  You may shave your face/neck.  Please follow these instructions carefully:  1.  Shower with CHG Soap the night before surgery and the  morning of surgery.  2.  If you choose to wash  your hair, wash your hair first as usual with your normal  shampoo.  3.  After you shampoo, rinse your hair and body thoroughly to remove the shampoo.                             4.  Use CHG  as you would any other liquid soap.  You can apply chg directly to the skin and wash.  Gently with a scrungie or clean washcloth.  5.  Apply the CHG Soap to your body ONLY FROM THE NECK DOWN.   Do   not use on face/ open                           Wound or open sores. Avoid contact with eyes, ears mouth and   genitals (private parts).                       Wash face,  Genitals (private parts) with your normal soap.             6.  Wash thoroughly, paying special attention to the area where your    surgery  will be performed.  7.  Thoroughly rinse your body with warm water from the neck down.  8.  DO NOT shower/wash with your normal soap after using and rinsing off the CHG Soap.                9.  Pat yourself dry with a clean towel.            10.  Wear clean pajamas.            11.  Place clean sheets on your bed the night of your first shower and do not  sleep with pets. Day of Surgery : Do not apply any lotions/deodorants the morning of surgery.  Please wear clean clothes to the hospital/surgery center.  FAILURE TO FOLLOW THESE INSTRUCTIONS MAY RESULT IN THE CANCELLATION OF YOUR SURGERY  PATIENT SIGNATURE_________________________________  NURSE SIGNATURE__________________________________  ________________________________________________________________________   Cassidy [name]  An incentive spirometer is a tool that can help keep your lungs clear and active. This tool measures how well you are filling your lungs with each breath. Taking long deep breaths may help reverse or decrease the chance of developing breathing (pulmonary) problems (especially infection) following:  A long period of time when you are unable to move or be active. BEFORE THE PROCEDURE   If the spirometer includes an indicator to show your best effort, your nurse or respiratory therapist will set it to a desired goal.  If possible, sit up straight or lean slightly forward. Try not to slouch.  Hold the  incentive spirometer in an upright position. INSTRUCTIONS FOR USE  1. Sit on the edge of your bed if possible, or sit up as far as you can in bed or on a chair. 2. Hold the incentive spirometer in an upright position. 3. Breathe out normally. 4. Place the mouthpiece in your mouth and seal your lips tightly around it. 5. Breathe in slowly and as deeply as possible, raising the piston or the ball toward the top of the column. 6. Hold your breath for 3-5 seconds or for as long as possible. Allow the piston or ball to fall to the bottom of the column. 7. Remove the mouthpiece from your mouth and breathe out normally. 8.  Rest for a few seconds and repeat Steps 1 through 7 at least 10 times every 1-2 hours when you are awake. Take your time and take a few normal breaths between deep breaths. 9. The spirometer may include an indicator to show your best effort. Use the indicator as a goal to work toward during each repetition. 10. After each set of 10 deep breaths, practice coughing to be sure your lungs are clear. If you have an incision (the cut made at the time of surgery), support your incision when coughing by placing a pillow or rolled up towels firmly against it. Once you are able to get out of bed, walk around indoors and cough well. You may stop using the incentive spirometer when instructed by your caregiver.  RISKS AND COMPLICATIONS  Take your time so you do not get dizzy or light-headed.  If you are in pain, you may need to take or ask for pain medication before doing incentive spirometry. It is harder to take a deep breath if you are having pain. AFTER USE  Rest and breathe slowly and easily.  It can be helpful to keep track of a log of your progress. Your caregiver can provide you with a simple table to help with this. If you are using the spirometer at home, follow these instructions: [place] IF:   You are having difficultly using the spirometer.  You have trouble using  the spirometer as often as instructed.  Your pain medication is not giving enough relief while using the spirometer.  You develop fever of 100.5 F (38.1 C) or higher. SEEK IMMEDIATE MEDICAL CARE IF:   You cough up bloody sputum that had not been present before.  You develop fever of 102 F (38.9 C) or greater.  You develop worsening pain at or near the incision site. MAKE SURE YOU:   Understand these instructions.  Will watch your condition.  Will get help right away if you are not doing well or get worse. Document Released: 08/18/2006 Document Revised: 06/30/2011 Document Reviewed: 10/19/2006 ExitCare Patient Information 2014 ExitCare, [place].   ________________________________________________________________________  WHAT IS A BLOOD TRANSFUSION? Blood Transfusion Information  A transfusion is the replacement of blood or some of its parts. Blood is made up of multiple cells which provide different functions.  Red blood cells carry oxygen and are used for blood loss replacement.  White blood cells fight against infection.  Platelets control bleeding.  Plasma helps clot blood.  Other blood products are available for specialized needs, such as hemophilia or other clotting disorders. BEFORE THE TRANSFUSION  Who gives blood for transfusions?   Healthy volunteers who are fully evaluated to make sure their blood is safe. This is blood bank blood. Transfusion therapy is the safest it has ever been in the practice of medicine. Before blood is taken from a donor, a complete history is taken to make sure that person has no history of diseases nor engages in risky social behavior (examples are intravenous drug use or sexual activity with multiple partners). The donor's travel history is screened to minimize risk of transmitting infections, such as malaria. The donated blood is tested for signs of infectious diseases, such as HIV and hepatitis. The blood is then tested to be sure it  is compatible with you in order to minimize the chance of a transfusion reaction. If you or a relative donates blood, this is often done in anticipation of surgery and is not appropriate for emergency situations. It takes  many days to process the donated blood. RISKS AND COMPLICATIONS Although transfusion therapy is very safe and saves many lives, the main dangers of transfusion include:   Getting an infectious disease.  Developing a transfusion reaction. This is an allergic reaction to something in the blood you were given. Every precaution is taken to prevent this. The decision to have a blood transfusion has been considered carefully by your caregiver before blood is given. Blood is not given unless the benefits outweigh the risks. AFTER THE TRANSFUSION  Right after receiving a blood transfusion, you will usually feel much better and more energetic. This is especially true if your red blood cells have gotten low (anemic). The transfusion raises the level of the red blood cells which carry oxygen, and this usually causes an energy increase.  The nurse administering the transfusion will monitor you carefully for complications. HOME CARE INSTRUCTIONS  No special instructions are needed after a transfusion. You may find your energy is better. Speak with your caregiver about any limitations on activity for underlying diseases you may have. SEEK MEDICAL CARE IF:   Your condition is not improving after your transfusion.  You develop redness or irritation at the intravenous (IV) site. SEEK IMMEDIATE MEDICAL CARE IF:  Any of the following symptoms occur over the next 12 hours:  Shaking chills.  You have a temperature by mouth above 102 F (38.9 C), not controlled by medicine.  Chest, back, or muscle pain.  People around you feel you are not acting correctly or are confused.  Shortness of breath or difficulty breathing.  Dizziness and fainting.  You get a rash or develop hives.  You  have a decrease in urine output.  Your urine turns a dark color or changes to pink, red, or brown. Any of the following symptoms occur over the next 10 days:  You have a temperature by mouth above 102 F (38.9 C), not controlled by medicine.  Shortness of breath.  Weakness after normal activity.  The white part of the eye turns yellow (jaundice).  You have a decrease in the amount of urine or are urinating less often.  Your urine turns a dark color or changes to pink, red, or brown. Document Released: 04/04/2000 Document Revised: 06/30/2011 Document Reviewed: 11/22/2007 [facility] Patient Information 2014 [place], [place].  _______________________________________________________________________

## 2018-11-30 NOTE — Telephone Encounter (Signed)
Faxed Surgery Clearance, office notes, labs (10 pages) to Marlin Canary at [facility], office # [phone], fax number [phone]

## 2018-11-30 NOTE — Progress Notes (Signed)
The following ae in epic: Last office visit note Dr. Renold Genta 11/25/2018 EKG 11/25/2018 Hgb A1C (5.6), CBC/diff, CMP 11/25/2018

## 2018-12-01 ENCOUNTER — Other Ambulatory Visit: Payer: Self-pay | Admitting: Orthopedic Surgery

## 2018-12-01 ENCOUNTER — Other Ambulatory Visit: Payer: Self-pay

## 2018-12-01 ENCOUNTER — Encounter (HOSPITAL_COMMUNITY)
Admission: RE | Admit: 2018-12-01 | Discharge: 2018-12-01 | Disposition: A | Discharge status: Home / Self Care | Payer: Medicare Other | Source: Ambulatory Visit | Attending: Orthopedic Surgery | Admitting: Orthopedic Surgery

## 2018-12-01 ENCOUNTER — Encounter (HOSPITAL_COMMUNITY): Payer: Self-pay

## 2018-12-01 ENCOUNTER — Ambulatory Visit (HOSPITAL_COMMUNITY)
Admission: RE | Admit: 2018-12-01 | Discharge: 2018-12-01 | Disposition: A | Discharge status: Home / Self Care | Payer: Medicare Other | Source: Ambulatory Visit | Attending: Orthopedic Surgery | Admitting: Orthopedic Surgery

## 2018-12-01 ENCOUNTER — Encounter (INDEPENDENT_AMBULATORY_CARE_PROVIDER_SITE_OTHER): Payer: Self-pay

## 2018-12-01 DIAGNOSIS — E039 Hypothyroidism, unspecified: Secondary | ICD-10-CM | POA: Insufficient documentation

## 2018-12-01 DIAGNOSIS — Z85820 Personal history of malignant melanoma of skin: Secondary | ICD-10-CM | POA: Insufficient documentation

## 2018-12-01 DIAGNOSIS — Z86718 Personal history of other venous thrombosis and embolism: Secondary | ICD-10-CM | POA: Diagnosis not present

## 2018-12-01 DIAGNOSIS — Z96651 Presence of right artificial knee joint: Secondary | ICD-10-CM | POA: Diagnosis not present

## 2018-12-01 DIAGNOSIS — Z01818 Encounter for other preprocedural examination: Secondary | ICD-10-CM | POA: Diagnosis not present

## 2018-12-01 DIAGNOSIS — M1712 Unilateral primary osteoarthritis, left knee: Secondary | ICD-10-CM | POA: Diagnosis not present

## 2018-12-01 DIAGNOSIS — Z86711 Personal history of pulmonary embolism: Secondary | ICD-10-CM | POA: Diagnosis not present

## 2018-12-01 DIAGNOSIS — I10 Essential (primary) hypertension: Secondary | ICD-10-CM | POA: Insufficient documentation

## 2018-12-01 DIAGNOSIS — Z79899 Other long term (current) drug therapy: Secondary | ICD-10-CM | POA: Insufficient documentation

## 2018-12-01 DIAGNOSIS — Z7982 Long term (current) use of aspirin: Secondary | ICD-10-CM | POA: Insufficient documentation

## 2018-12-01 DIAGNOSIS — Z79890 Hormone replacement therapy (postmenopausal): Secondary | ICD-10-CM | POA: Insufficient documentation

## 2018-12-01 HISTORY — DX: Lymphedema, not elsewhere classified: I89.0

## 2018-12-01 HISTORY — DX: Cardiac murmur, unspecified: R01.1

## 2018-12-01 HISTORY — DX: Anemia, unspecified: D64.9

## 2018-12-01 HISTORY — DX: Hyperlipidemia, unspecified: E78.5

## 2018-12-01 HISTORY — DX: Acute embolism and thrombosis of unspecified deep veins of unspecified lower extremity: I82.409

## 2018-12-01 HISTORY — DX: Malignant melanoma of skin, unspecified: C43.9

## 2018-12-01 HISTORY — DX: Fatty (change of) liver, not elsewhere classified: K76.0

## 2018-12-01 HISTORY — DX: Anxiety disorder, unspecified: F41.9

## 2018-12-01 LAB — URINALYSIS, ROUTINE W REFLEX MICROSCOPIC
Bilirubin Urine: NEGATIVE
Glucose, UA: NEGATIVE mg/dL
Ketones, ur: NEGATIVE mg/dL
Nitrite: NEGATIVE
Protein, ur: 100 mg/dL — AB
RBC / HPF: >50 RBC/hpf — ABNORMAL HIGH (ref 0–5)
Specific Gravity, Urine: 1.013 (ref 1.005–1.030)
WBC, UA: >50 WBC/hpf — ABNORMAL HIGH (ref 0–5)
pH: 5 (ref 5.0–8.0)

## 2018-12-01 LAB — SURGICAL PCR SCREEN
MRSA, PCR: NEGATIVE
Staphylococcus aureus: NEGATIVE

## 2018-12-01 LAB — PROTIME-INR
INR: 1 (ref 0.8–1.2)
Prothrombin Time: 12.9 seconds (ref 11.4–15.2)

## 2018-12-01 LAB — ABO/RH: ABO/RH(D): O NEG

## 2018-12-01 LAB — APTT: aPTT: 37 seconds — ABNORMAL HIGH (ref 24–36)

## 2018-12-01 NOTE — Care Plan (Signed)
Spoke with patient prior to surgery. She will discharge to home with family to assist and HHPT. Referral to [facility]. Rolling walker ordered. Patient and MD agreeable to plan. Choice offered.    Ladell Heads, [place]

## 2018-12-01 NOTE — Progress Notes (Signed)
SPOKE W/  [place]     SCREENING SYMPTOMS OF COVID 19:   COUGH--NO  RUNNY NOSE--- NO  SORE THROAT---NO  NASAL CONGESTION----NO  SNEEZING----NO  SHORTNESS OF BREATH---NO  DIFFICULTY BREATHING---NO  TEMP >100.0 -----NO  UNEXPLAINED BODY ACHES------NO  CHILLS -------- NO  HEADACHES ---------NO  LOSS OF SMELL/ TASTE --------NO    HAVE YOU OR ANY FAMILY MEMBER TRAVELLED PAST 14 DAYS OUT OF THE   COUNTY---NO STATE----NO COUNTRY----NO  HAVE YOU OR ANY FAMILY MEMBER BEEN EXPOSED TO ANYONE WITH COVID 19? NO

## 2018-12-02 ENCOUNTER — Telehealth: Payer: Self-pay | Admitting: Internal Medicine

## 2018-12-02 ENCOUNTER — Encounter: Payer: Self-pay | Admitting: Internal Medicine

## 2018-12-02 ENCOUNTER — Other Ambulatory Visit (HOSPITAL_COMMUNITY)
Admission: RE | Admit: 2018-12-02 | Discharge: 2018-12-02 | Disposition: A | Discharge status: Home / Self Care | Payer: Medicare Other | Source: Ambulatory Visit | Attending: Orthopedic Surgery | Admitting: Orthopedic Surgery

## 2018-12-02 ENCOUNTER — Telehealth (INDEPENDENT_AMBULATORY_CARE_PROVIDER_SITE_OTHER): Payer: Medicare Other | Admitting: Internal Medicine

## 2018-12-02 DIAGNOSIS — R8290 Unspecified abnormal findings in urine: Secondary | ICD-10-CM

## 2018-12-02 DIAGNOSIS — Z01812 Encounter for preprocedural laboratory examination: Secondary | ICD-10-CM | POA: Insufficient documentation

## 2018-12-02 DIAGNOSIS — Z20828 Contact with and (suspected) exposure to other viral communicable diseases: Secondary | ICD-10-CM | POA: Insufficient documentation

## 2018-12-02 DIAGNOSIS — M1712 Unilateral primary osteoarthritis, left knee: Secondary | ICD-10-CM | POA: Diagnosis present

## 2018-12-02 LAB — SARS CORONAVIRUS 2 (TAT 6-24 HRS): SARS Coronavirus 2: NEGATIVE

## 2018-12-02 MED ORDER — CIPROFLOXACIN HCL 500 MG PO TABS: 500.0000 mg | ORAL_TABLET | Freq: Two times a day (BID) | ORAL | 0 refills | Status: DC

## 2018-12-02 NOTE — Progress Notes (Signed)
Anesthesia Chart Review   Case: [id number] Date/Time: 12/06/18 1000   Procedure: Left Knee Arthroplasty (Left Knee)   Anesthesia type: Spinal   Pre-op diagnosis: Left Knee Osteoarthitis   [facility]: [facility]   Surgeon: Frederik Pear, MD      DISCUSSION:78 y.o. never smoker with h/o PONV, DVT, PE 2010 post op knee replacement, HTN, hypothyroidism, anemia, migraines, left knee OA scheduled for above procedure 12/06/2018 with Dr. Frederik Pear.   Pt seen by PCP, Dr. Tedra Senegal, 11/25/2018 for preoperative clearance.  Stable at this visit.   Anticipate pt can proceed with planned procedure barring acute status change.   VS: BP 136/73   Pulse 95   Temp 36.7 C (Oral)   Resp 16   Ht 5\' 5"  (1.651 m)   Wt 105.8 kg   SpO2 97%   BMI 38.81 kg/m   PROVIDERS: Elby Showers, MD is PCP    LABS: Labs reviewed: Acceptable for surgery. and Routed to surgeon (all labs ordered are listed, but only abnormal results are displayed)  Labs Reviewed  APTT - Abnormal; Notable for the following components:      Result Value   aPTT 37 (*)    All other components within normal limits  URINALYSIS, ROUTINE W REFLEX MICROSCOPIC - Abnormal; Notable for the following components:   APPearance CLOUDY (*)    Hgb urine dipstick LARGE (*)    Protein, ur 100 (*)    Leukocytes,Ua LARGE (*)    RBC / HPF >50 (*)    WBC, UA >50 (*)    Bacteria, UA RARE (*)    All other components within normal limits  SURGICAL PCR SCREEN  PROTIME-INR  TYPE AND SCREEN  ABO/RH     IMAGES: Chest Xray 12/01/2018 (Routed to Surgeon and PCP) IMPRESSION: 1. No acute cardiopulmonary process. 2. Borderline cardiomegaly. 3. Shift of the trachea to the right. This may be secondary to a large left hemi thyroid or other mass. Consider follow-up with ultrasound or CT as clinically indicated.   EKG: 11/25/2018 Rate 96 bpm Sinus rhythm  Left axis deviation   CV:  Past Medical History:  Diagnosis Date  . Anemia    with pregnancy  . Anxiety   . Arthritis   . Cancer ([place])    right foot,top of arm, lympth nodes removed  . Dependent edema   . DVT (deep venous thrombosis) ([place])   . Fatty liver   . Heart murmur    occ  . HTN (hypertension), benign   . Hyperlipidemia   . Hypothyroidism   . Lymphedema   . Melanoma ([place])   . Migraine 08/27/2010  . Osteopenia   . PE (pulmonary embolism) 2010   bilateral hx post op knee replacement  . PONV (postoperative nausea and vomiting)     Past Surgical History:  Procedure Laterality Date  . ABDOMINAL HYSTERECTOMY     age 66  . AMPUTATION  11/10/2011   Procedure: AMPUTATION RAY;  Surgeon: Kerin Salen, MD;  Location: [facility];  Service: Orthopedics;  Laterality: Left;  LEFT 2ND TOE TRANS MIDDLE PHALANX AMPUTATION   . ANKLE FUSION  2005   lt-fusion  . APPENDECTOMY    . CHOLECYSTECTOMY  02/14/2002  . COLONOSCOPY    . GANGLION CYST EXCISION  2008   Rt wrist  . JOINT REPLACEMENT  2010   rt total knee  . KNEE ARTHROSCOPY  6/03   Rt knee  . OVARIAN CYST  SURGERY    . RECTOCELE REPAIR  2009  . REDUCTION MAMMAPLASTY Bilateral   . UPPER GI ENDOSCOPY      MEDICATIONS: . acetaminophen (TYLENOL) 650 MG CR tablet  . amLODipine (NORVASC) 5 MG tablet  . aspirin 81 MG tablet  . furosemide (LASIX) 40 MG tablet  . ibuprofen (ADVIL) 200 MG tablet  . loperamide (IMODIUM) 1 MG/5ML solution  . Multiple Vitamin (MULTIVITAMIN WITH MINERALS) TABS tablet  . neomycin-bacitracin-polymyxin (NEOSPORIN) ointment  . SYNTHROID 100 MCG tablet   No current facility-administered medications for this encounter.     Maia Plan [facility] Pre-Surgical Testing [phone] 12/02/18  3:32 PM

## 2018-12-02 NOTE — Progress Notes (Signed)
Am sending in Cipro x 3 days

## 2018-12-02 NOTE — Telephone Encounter (Signed)
Pt seen at pre op center for testing today and they forwarded me her U/A results. Not sure if clean catch. Probably can't get culture back and get her treated before Surgery for TKA on August 17 so will go ahead and treat her with Cipro 500 mg twice a day x 3 days

## 2018-12-02 NOTE — H&P (Signed)
TOTAL KNEE ADMISSION H&P  Patient is being admitted for left total knee arthroplasty.  Subjective:  Chief Complaint:left knee pain.  HPI: Cassidy [name], 78 y.o. female, has a history of pain and functional disability in the left knee due to arthritis and has failed non-surgical conservative treatments for greater than 12 weeks to includeNSAID's and/or analgesics, corticosteriod injections, flexibility and strengthening excercises, use of assistive devices, weight reduction as appropriate and activity modification.  Onset of symptoms was gradual, starting several years ago with gradually worsening course since that time. The patient noted no past surgery on the left knee(s).  Patient currently rates pain in the left knee(s) at 10 out of 10 with activity. Patient has night pain, worsening of pain with activity and weight bearing, pain that interferes with activities of daily living, pain with passive range of motion, crepitus and joint swelling.  Patient has evidence of periarticular osteophytes and joint space narrowing by imaging studies.   There is no active infection.  Patient Active Problem List   Diagnosis Date Noted  . Impaired fasting glucose 05/10/2018  . Encounter for cosmetic surgery 07/16/2015  . Melanoma of foot ([place]) 12/13/2014  . HAV (hallux abducto valgus) 11/28/2014  . Venous insufficiency (chronic) (peripheral) 07/05/2014  . Lymphedema of both lower extremities 06/28/2014  . Venous hypertension of both lower extremities 06/28/2014  . Melanoma ([place]) 01/20/2014  . Hypothyroidism due to acquired atrophy of thyroid 01/04/2014  . Onychomycosis 02/10/2012  . History of impaired glucose tolerance 11/02/2011  . History of pulmonary embolism 11/02/2011  . S/P right unicompartmental knee replacement 11/02/2011  . Hypertension 08/27/2010  . Hypothyroidism 08/27/2010  . Obesity 08/27/2010  . Migraine 08/27/2010   Past Medical History:  Diagnosis Date  . Anemia    with  pregnancy  . Anxiety   . Arthritis   . Cancer ([place])    right foot,top of arm, lympth nodes removed  . Dependent edema   . DVT (deep venous thrombosis) ([place])   . Fatty liver   . Heart murmur    occ  . HTN (hypertension), benign   . Hyperlipidemia   . Hypothyroidism   . Lymphedema   . Melanoma ([place])   . Migraine 08/27/2010  . Osteopenia   . PE (pulmonary embolism) 2010   bilateral hx post op knee replacement  . PONV (postoperative nausea and vomiting)     Past Surgical History:  Procedure Laterality Date  . ABDOMINAL HYSTERECTOMY     age 24  . AMPUTATION  11/10/2011   Procedure: AMPUTATION RAY;  Surgeon: Kerin Salen, MD;  Location: [facility];  Service: Orthopedics;  Laterality: Left;  LEFT 2ND TOE TRANS MIDDLE PHALANX AMPUTATION   . ANKLE FUSION  2005   lt-fusion  . APPENDECTOMY    . CHOLECYSTECTOMY  02/14/2002  . COLONOSCOPY    . GANGLION CYST EXCISION  2008   Rt wrist  . JOINT REPLACEMENT  2010   rt total knee  . KNEE ARTHROSCOPY  6/03   Rt knee  . OVARIAN CYST SURGERY    . RECTOCELE REPAIR  2009  . REDUCTION MAMMAPLASTY Bilateral   . UPPER GI ENDOSCOPY      No current facility-administered medications for this encounter.    Current Outpatient Medications  Medication Sig Dispense Refill Last Dose  . acetaminophen (TYLENOL) 650 MG CR tablet Take 1,300 mg by mouth daily as needed for pain.     Marland Kitchen amLODipine (NORVASC) 5 MG tablet TAKE ONE TABLET  EACH DAY (Patient taking differently: Take 5 mg by mouth daily. ) 90 tablet 1   . aspirin 81 MG tablet Take 81 mg by mouth daily.     . furosemide (LASIX) 40 MG tablet TAKE ONE TABLET BY MOUTH ONCE DAILY (Patient taking differently: Take 40 mg by mouth daily. ) 90 tablet 3   . ibuprofen (ADVIL) 200 MG tablet Take 400 mg by mouth daily as needed for moderate pain.     Marland Kitchen loperamide (IMODIUM) 1 MG/5ML solution Take 2 mg by mouth as needed for diarrhea or loose stools.     . Multiple Vitamin (MULTIVITAMIN WITH  MINERALS) TABS tablet Take 1 tablet by mouth daily with lunch.     . neomycin-bacitracin-polymyxin (NEOSPORIN) ointment Apply 1 application topically as needed for wound care.     Marland Kitchen SYNTHROID 100 MCG tablet TAKE ONE TABLET EVERY DAY (Patient taking differently: Take 100 mcg by mouth at bedtime. ) 90 tablet 3    No Known Allergies  Social History   Tobacco Use  . Smoking status: Never Smoker  . Smokeless tobacco: Never Used  Substance Use Topics  . Alcohol use: No    Family History  Problem Relation Age of Onset  . Heart disease Mother   . Heart disease Father   . Diabetes Brother   . Heart disease Brother   . Breast cancer Daughter 40     Review of Systems  Constitutional: Negative.   HENT: Negative.   Eyes: Negative.   Respiratory: Negative.   Cardiovascular: Negative.   Gastrointestinal: Positive for nausea and vomiting.  Genitourinary: Negative.   Musculoskeletal: Positive for joint pain and myalgias.  Skin: Negative.        Sores that won't heal  Neurological: Negative.   Endo/Heme/Allergies: Positive for polydipsia.  Psychiatric/Behavioral: Negative.     Objective:  Physical Exam  Constitutional: She is oriented to person, place, and time. She appears well-developed and well-nourished.  HENT:  Head: Normocephalic and atraumatic.  Eyes: Pupils are equal, round, and reactive to light.  Neck: Normal range of motion. Neck supple.  Cardiovascular: Intact distal pulses.  Respiratory: Effort normal.  Musculoskeletal:        General: Tenderness present.     Comments: The surgical scar to the right knee is well-healed range of motion 0/105.  The left knee has obvious varus deformity tender along the medial joint line 1+ effusion range of motion 5/120 good quadriceps and hamstring power neurovascular intact distally.  Neurological: She is alert and oriented to person, place, and time.  Skin: Skin is warm and dry.  Psychiatric: She has a normal mood and affect. Her  behavior is normal. Judgment and thought content normal.    Vital signs in last 24 hours: Temp:  [98 F (36.7 C)] 98 F (36.7 C) (08/12 0843) Pulse Rate:  [95] 95 (08/12 0843) Resp:  [16] 16 (08/12 0843) BP: (136)/(73) 136/73 (08/12 0843) SpO2:  [97 %] 97 % (08/12 0843) Weight:  [105.8 kg] 105.8 kg (08/12 0843)  Labs:   Estimated body mass index is 38.81 kg/m as calculated from the following:   Height as of 12/01/18: 5\' 5"  (1.651 m).   Weight as of 12/01/18: 105.8 kg.   Imaging Review Plain radiographs demonstrate  AP Rosen were lateral and sunrise x-rays continue to show end-stage arthritis of the left knee bone-on-bone lateral subluxation of the tibia and erosion of the medial compartment 1-2 mm with peripheral osteophytes.  The Sigma RP knee on  the right side is in good position no evidence of loosening.      Assessment/Plan:  End stage arthritis, left knee   The patient history, physical examination, clinical judgment of the provider and imaging studies are consistent with end stage degenerative joint disease of the left knee(s) and total knee arthroplasty is deemed medically necessary. The treatment options including medical management, injection therapy arthroscopy and arthroplasty were discussed at length. The risks and benefits of total knee arthroplasty were presented and reviewed. The risks due to aseptic loosening, infection, stiffness, patella tracking problems, thromboembolic complications and other imponderables were discussed. The patient acknowledged the explanation, agreed to proceed with the plan and consent was signed. Patient is being admitted for inpatient treatment for surgery, pain control, PT, OT, prophylactic antibiotics, VTE prophylaxis, progressive ambulation and ADL's and discharge planning. The patient is planning to be discharged home with home health services    Anticipated LOS equal to or greater than 2 midnights due to - Age 49 and older with one  or more of the following:  - Obesity  - Expected need for hospital services (PT, OT, Nursing) required for safe  discharge  - Anticipated need for postoperative skilled nursing care or inpatient rehab  - Active co-morbidities: DVT/VTE in past

## 2018-12-02 NOTE — Telephone Encounter (Signed)
Spoke with pt. Urine specimen was not a clean catch today. Unable to void very much early this am. Pt will take Cipro x 3 days as prescribed.

## 2018-12-03 ENCOUNTER — Other Ambulatory Visit: Payer: Self-pay

## 2018-12-03 ENCOUNTER — Telehealth: Payer: Self-pay | Admitting: Internal Medicine

## 2018-12-03 ENCOUNTER — Ambulatory Visit
Admission: RE | Admit: 2018-12-03 | Discharge: 2018-12-03 | Disposition: A | Discharge status: Home / Self Care | Payer: Medicare Other | Source: Ambulatory Visit | Attending: Internal Medicine | Admitting: Internal Medicine

## 2018-12-03 ENCOUNTER — Encounter: Payer: Self-pay | Admitting: Internal Medicine

## 2018-12-03 DIAGNOSIS — E041 Nontoxic single thyroid nodule: Secondary | ICD-10-CM | POA: Diagnosis not present

## 2018-12-03 DIAGNOSIS — R9389 Abnormal findings on diagnostic imaging of other specified body structures: Secondary | ICD-10-CM

## 2018-12-03 DIAGNOSIS — R222 Localized swelling, mass and lump, trunk: Secondary | ICD-10-CM

## 2018-12-03 DIAGNOSIS — E049 Nontoxic goiter, unspecified: Secondary | ICD-10-CM

## 2018-12-03 MED ORDER — IOPAMIDOL (ISOVUE-300) INJECTION 61%
75.0000 mL | Freq: Once | INTRAVENOUS | Status: AC | PRN
  Administered 2018-12-03: 75 mL via INTRAVENOUS

## 2018-12-03 NOTE — Telephone Encounter (Signed)
Cassidy [name] called to say that the surgeon's office had called after 5 yesterday to tell her that the radiologist is recommending her to have [place] or CT in the near future, and that this would not interfere or postpone her surgery.

## 2018-12-03 NOTE — Telephone Encounter (Signed)
This has to do with a finding of deviated trachea on CXT. We will try to get CT of neck to resolve this issue.Mariann Laster will try contacting [facility] for appt.

## 2018-12-03 NOTE — Telephone Encounter (Signed)
Radiology has graciously agreed to do soft tissue CT of neck with contrast to day due to abnormal trachea deviation on preop CXR. Scheduled for TKA by Dr. Mayer Camel on Monday

## 2018-12-03 NOTE — Telephone Encounter (Signed)
Scheduled Soft tissue CT, to be worked in today, Patient aware.

## 2018-12-04 DIAGNOSIS — E049 Nontoxic goiter, unspecified: Secondary | ICD-10-CM | POA: Insufficient documentation

## 2018-12-05 MED ORDER — TRANEXAMIC ACID 1000 MG/10ML IV SOLN
2000.0000 mg | INTRAVENOUS | Status: DC
  Filled 2018-12-05: qty 20

## 2018-12-05 MED ORDER — BUPIVACAINE LIPOSOME 1.3 % IJ SUSP
20.0000 mL | Freq: Once | INTRAMUSCULAR | Status: DC
  Filled 2018-12-05: qty 20

## 2018-12-06 ENCOUNTER — Inpatient Hospital Stay (HOSPITAL_COMMUNITY): Payer: Medicare Other | Admitting: Physician Assistant

## 2018-12-06 ENCOUNTER — Inpatient Hospital Stay (HOSPITAL_COMMUNITY)
Admission: RE | Admit: 2018-12-06 | Discharge: 2018-12-08 | DRG: 470 | Disposition: A | Discharge status: Home Health | Payer: Medicare Other | Attending: Orthopedic Surgery | Admitting: Orthopedic Surgery

## 2018-12-06 ENCOUNTER — Inpatient Hospital Stay (HOSPITAL_COMMUNITY): Payer: Medicare Other | Admitting: Certified Registered"

## 2018-12-06 ENCOUNTER — Encounter (HOSPITAL_COMMUNITY): Payer: Self-pay

## 2018-12-06 ENCOUNTER — Encounter (HOSPITAL_COMMUNITY)
Admission: RE | Disposition: A | Discharge status: Home Health | Payer: Self-pay | Source: Home / Self Care | Attending: Orthopedic Surgery

## 2018-12-06 ENCOUNTER — Other Ambulatory Visit: Payer: Self-pay

## 2018-12-06 DIAGNOSIS — G8918 Other acute postprocedural pain: Secondary | ICD-10-CM | POA: Diagnosis not present

## 2018-12-06 DIAGNOSIS — Z86718 Personal history of other venous thrombosis and embolism: Secondary | ICD-10-CM | POA: Diagnosis not present

## 2018-12-06 DIAGNOSIS — Z803 Family history of malignant neoplasm of breast: Secondary | ICD-10-CM | POA: Diagnosis not present

## 2018-12-06 DIAGNOSIS — E669 Obesity, unspecified: Secondary | ICD-10-CM | POA: Diagnosis not present

## 2018-12-06 DIAGNOSIS — M21162 Varus deformity, not elsewhere classified, left knee: Secondary | ICD-10-CM | POA: Diagnosis not present

## 2018-12-06 DIAGNOSIS — I10 Essential (primary) hypertension: Secondary | ICD-10-CM | POA: Diagnosis present

## 2018-12-06 DIAGNOSIS — Z96652 Presence of left artificial knee joint: Secondary | ICD-10-CM

## 2018-12-06 DIAGNOSIS — Z7982 Long term (current) use of aspirin: Secondary | ICD-10-CM

## 2018-12-06 DIAGNOSIS — D62 Acute posthemorrhagic anemia: Secondary | ICD-10-CM | POA: Diagnosis not present

## 2018-12-06 DIAGNOSIS — Z90710 Acquired absence of both cervix and uterus: Secondary | ICD-10-CM | POA: Diagnosis not present

## 2018-12-06 DIAGNOSIS — Z89422 Acquired absence of other left toe(s): Secondary | ICD-10-CM

## 2018-12-06 DIAGNOSIS — I872 Venous insufficiency (chronic) (peripheral): Secondary | ICD-10-CM | POA: Diagnosis present

## 2018-12-06 DIAGNOSIS — Z96651 Presence of right artificial knee joint: Secondary | ICD-10-CM | POA: Diagnosis present

## 2018-12-06 DIAGNOSIS — Z79899 Other long term (current) drug therapy: Secondary | ICD-10-CM

## 2018-12-06 DIAGNOSIS — Z85820 Personal history of malignant melanoma of skin: Secondary | ICD-10-CM

## 2018-12-06 DIAGNOSIS — Z79890 Hormone replacement therapy (postmenopausal): Secondary | ICD-10-CM | POA: Diagnosis not present

## 2018-12-06 DIAGNOSIS — Z8249 Family history of ischemic heart disease and other diseases of the circulatory system: Secondary | ICD-10-CM

## 2018-12-06 DIAGNOSIS — M1712 Unilateral primary osteoarthritis, left knee: Secondary | ICD-10-CM | POA: Diagnosis not present

## 2018-12-06 DIAGNOSIS — Z981 Arthrodesis status: Secondary | ICD-10-CM | POA: Diagnosis not present

## 2018-12-06 DIAGNOSIS — M25762 Osteophyte, left knee: Secondary | ICD-10-CM | POA: Diagnosis not present

## 2018-12-06 DIAGNOSIS — E039 Hypothyroidism, unspecified: Secondary | ICD-10-CM | POA: Diagnosis not present

## 2018-12-06 DIAGNOSIS — E785 Hyperlipidemia, unspecified: Secondary | ICD-10-CM | POA: Diagnosis not present

## 2018-12-06 DIAGNOSIS — Z86711 Personal history of pulmonary embolism: Secondary | ICD-10-CM

## 2018-12-06 DIAGNOSIS — K760 Fatty (change of) liver, not elsewhere classified: Secondary | ICD-10-CM | POA: Diagnosis not present

## 2018-12-06 DIAGNOSIS — Z6838 Body mass index (BMI) 38.0-38.9, adult: Secondary | ICD-10-CM

## 2018-12-06 DIAGNOSIS — F419 Anxiety disorder, unspecified: Secondary | ICD-10-CM | POA: Diagnosis not present

## 2018-12-06 DIAGNOSIS — E034 Atrophy of thyroid (acquired): Secondary | ICD-10-CM | POA: Diagnosis present

## 2018-12-06 DIAGNOSIS — Z9049 Acquired absence of other specified parts of digestive tract: Secondary | ICD-10-CM | POA: Diagnosis not present

## 2018-12-06 HISTORY — PX: TOTAL KNEE ARTHROPLASTY: SHX125

## 2018-12-06 LAB — TYPE AND SCREEN
ABO/RH(D): O NEG
Antibody Screen: NEGATIVE

## 2018-12-06 SURGERY — ARTHROPLASTY, KNEE, TOTAL
Anesthesia: Regional | Site: Knee | Laterality: Left

## 2018-12-06 MED ORDER — EPHEDRINE SULFATE-NACL 50-0.9 MG/10ML-% IV SOSY
PREFILLED_SYRINGE | INTRAVENOUS | Status: DC | PRN
  Administered 2018-12-06: 10 mg via INTRAVENOUS

## 2018-12-06 MED ORDER — BUPIVACAINE-EPINEPHRINE (PF) 0.25% -1:200000 IJ SOLN
INTRAMUSCULAR | Status: DC | PRN
  Administered 2018-12-06: 30 mL via PERINEURAL

## 2018-12-06 MED ORDER — KCL IN DEXTROSE-NACL 20-5-0.45 MEQ/L-%-% IV SOLN
INTRAVENOUS | Status: DC
  Administered 2018-12-06 – 2018-12-07 (×2): via INTRAVENOUS
  Filled 2018-12-06 (×4): qty 1000

## 2018-12-06 MED ORDER — ACETAMINOPHEN 325 MG PO TABS
325.0000 mg | ORAL_TABLET | Freq: Four times a day (QID) | ORAL | Status: DC | PRN
  Administered 2018-12-06 – 2018-12-08 (×3): 650 mg via ORAL
  Filled 2018-12-06 (×3): qty 2

## 2018-12-06 MED ORDER — PROPOFOL 10 MG/ML IV BOLUS
INTRAVENOUS | Status: DC | PRN
  Administered 2018-12-06: 40 mg via INTRAVENOUS

## 2018-12-06 MED ORDER — PROPOFOL 500 MG/50ML IV EMUL
INTRAVENOUS | Status: DC | PRN
  Administered 2018-12-06: 75 ug/kg/min via INTRAVENOUS

## 2018-12-06 MED ORDER — ONDANSETRON HCL 4 MG/2ML IJ SOLN: 4.0000 mg | Freq: Four times a day (QID) | INTRAMUSCULAR | Status: DC | PRN

## 2018-12-06 MED ORDER — BISACODYL 5 MG PO TBEC: 5.0000 mg | DELAYED_RELEASE_TABLET | Freq: Every day | ORAL | Status: DC | PRN

## 2018-12-06 MED ORDER — METOCLOPRAMIDE HCL 5 MG/ML IJ SOLN: 5.0000 mg | Freq: Three times a day (TID) | INTRAMUSCULAR | Status: DC | PRN

## 2018-12-06 MED ORDER — METHOCARBAMOL 500 MG IVPB - SIMPLE MED
500.0000 mg | Freq: Four times a day (QID) | INTRAVENOUS | Status: DC | PRN
  Filled 2018-12-06: qty 50

## 2018-12-06 MED ORDER — LACTATED RINGERS IV SOLN
INTRAVENOUS | Status: DC
  Administered 2018-12-06 (×2): via INTRAVENOUS

## 2018-12-06 MED ORDER — MIDAZOLAM HCL 2 MG/2ML IJ SOLN: 1.0000 mg | Freq: Once | INTRAMUSCULAR | Status: DC

## 2018-12-06 MED ORDER — CIPROFLOXACIN HCL 500 MG PO TABS
500.0000 mg | ORAL_TABLET | Freq: Two times a day (BID) | ORAL | Status: DC
  Administered 2018-12-06 – 2018-12-07 (×2): 500 mg via ORAL
  Filled 2018-12-06 (×2): qty 1

## 2018-12-06 MED ORDER — MIDAZOLAM HCL 2 MG/2ML IJ SOLN
1.0000 mg | INTRAMUSCULAR | Status: DC
  Filled 2018-12-06: qty 2

## 2018-12-06 MED ORDER — WATER FOR IRRIGATION, STERILE IR SOLN
Status: DC | PRN
  Administered 2018-12-06: 2000 mL

## 2018-12-06 MED ORDER — APIXABAN 2.5 MG PO TABS
2.5000 mg | ORAL_TABLET | Freq: Two times a day (BID) | ORAL | Status: DC
  Administered 2018-12-07 – 2018-12-08 (×3): 2.5 mg via ORAL
  Filled 2018-12-06 (×3): qty 1

## 2018-12-06 MED ORDER — FLEET ENEMA 7-19 GM/118ML RE ENEM: 1.0000 | ENEMA | Freq: Once | RECTAL | Status: DC | PRN

## 2018-12-06 MED ORDER — MENTHOL 3 MG MT LOZG: 1.0000 | LOZENGE | OROMUCOSAL | Status: DC | PRN

## 2018-12-06 MED ORDER — LEVOTHYROXINE SODIUM 100 MCG PO TABS
100.0000 ug | ORAL_TABLET | Freq: Every day | ORAL | Status: DC
  Administered 2018-12-07 – 2018-12-08 (×2): 100 ug via ORAL
  Filled 2018-12-06: qty 2
  Filled 2018-12-06: qty 1

## 2018-12-06 MED ORDER — HYDROMORPHONE HCL 1 MG/ML IJ SOLN: 0.5000 mg | INTRAMUSCULAR | Status: DC | PRN

## 2018-12-06 MED ORDER — SODIUM CHLORIDE (PF) 0.9 % IJ SOLN
INTRAMUSCULAR | Status: AC
  Filled 2018-12-06: qty 20

## 2018-12-06 MED ORDER — METOCLOPRAMIDE HCL 5 MG PO TABS: 5.0000 mg | ORAL_TABLET | Freq: Three times a day (TID) | ORAL | Status: DC | PRN

## 2018-12-06 MED ORDER — LOPERAMIDE HCL 1 MG/7.5ML PO SUSP: 2.0000 mg | ORAL | Status: DC | PRN

## 2018-12-06 MED ORDER — TRANEXAMIC ACID-NACL 1000-0.7 MG/100ML-% IV SOLN
1000.0000 mg | Freq: Once | INTRAVENOUS | Status: AC
  Administered 2018-12-06: 1000 mg via INTRAVENOUS
  Filled 2018-12-06: qty 100

## 2018-12-06 MED ORDER — DOCUSATE SODIUM 100 MG PO CAPS
100.0000 mg | ORAL_CAPSULE | Freq: Two times a day (BID) | ORAL | Status: DC
  Administered 2018-12-07 – 2018-12-08 (×2): 100 mg via ORAL
  Filled 2018-12-06 (×3): qty 1

## 2018-12-06 MED ORDER — BUPIVACAINE-EPINEPHRINE (PF) 0.25% -1:200000 IJ SOLN
INTRAMUSCULAR | Status: AC
  Filled 2018-12-06: qty 30

## 2018-12-06 MED ORDER — OXYCODONE-ACETAMINOPHEN 5-325 MG PO TABS: 1.0000 | ORAL_TABLET | ORAL | 0 refills | Status: DC | PRN

## 2018-12-06 MED ORDER — BUPIVACAINE IN DEXTROSE 0.75-8.25 % IT SOLN
INTRATHECAL | Status: DC | PRN
  Administered 2018-12-06: 1.8 mL via INTRATHECAL

## 2018-12-06 MED ORDER — ONDANSETRON HCL 4 MG PO TABS: 4.0000 mg | ORAL_TABLET | Freq: Four times a day (QID) | ORAL | Status: DC | PRN

## 2018-12-06 MED ORDER — AMLODIPINE BESYLATE 5 MG PO TABS
5.0000 mg | ORAL_TABLET | Freq: Every day | ORAL | Status: DC
  Administered 2018-12-08: 11:00:00 5 mg via ORAL
  Filled 2018-12-06 (×2): qty 1

## 2018-12-06 MED ORDER — SODIUM CHLORIDE (PF) 0.9 % IJ SOLN
INTRAMUSCULAR | Status: AC
  Filled 2018-12-06: qty 50

## 2018-12-06 MED ORDER — ALUM & MAG HYDROXIDE-SIMETH 200-200-20 MG/5ML PO SUSP: 30.0000 mL | ORAL | Status: DC | PRN

## 2018-12-06 MED ORDER — ADULT MULTIVITAMIN W/MINERALS CH
1.0000 | ORAL_TABLET | Freq: Every day | ORAL | Status: DC
  Administered 2018-12-07 – 2018-12-08 (×2): 1 via ORAL
  Filled 2018-12-06 (×2): qty 1

## 2018-12-06 MED ORDER — PHENOL 1.4 % MT LIQD: 1.0000 | OROMUCOSAL | Status: DC | PRN

## 2018-12-06 MED ORDER — DEXAMETHASONE SODIUM PHOSPHATE 10 MG/ML IJ SOLN
INTRAMUSCULAR | Status: DC | PRN
  Administered 2018-12-06: 5 mg via INTRAVENOUS

## 2018-12-06 MED ORDER — PHENYLEPHRINE 40 MCG/ML (10ML) SYRINGE FOR IV PUSH (FOR BLOOD PRESSURE SUPPORT)
PREFILLED_SYRINGE | INTRAVENOUS | Status: AC
  Filled 2018-12-06: qty 10

## 2018-12-06 MED ORDER — FUROSEMIDE 40 MG PO TABS
40.0000 mg | ORAL_TABLET | Freq: Every day | ORAL | Status: DC
  Administered 2018-12-06 – 2018-12-07 (×2): 40 mg via ORAL
  Filled 2018-12-06 (×2): qty 1

## 2018-12-06 MED ORDER — PROPOFOL 10 MG/ML IV BOLUS
INTRAVENOUS | Status: AC
  Filled 2018-12-06: qty 20

## 2018-12-06 MED ORDER — SODIUM CHLORIDE 0.9 % IR SOLN
Status: DC | PRN
  Administered 2018-12-06: 1000 mL

## 2018-12-06 MED ORDER — SODIUM CHLORIDE 0.9 % IV SOLN
INTRAVENOUS | Status: DC | PRN
  Administered 2018-12-06: 10:00:00 50 ug/min via INTRAVENOUS

## 2018-12-06 MED ORDER — BUPIVACAINE LIPOSOME 1.3 % IJ SUSP
INTRAMUSCULAR | Status: DC | PRN
  Administered 2018-12-06: 20 mL

## 2018-12-06 MED ORDER — METHOCARBAMOL 500 MG PO TABS
500.0000 mg | ORAL_TABLET | Freq: Four times a day (QID) | ORAL | Status: DC | PRN
  Administered 2018-12-06 – 2018-12-08 (×5): 500 mg via ORAL
  Filled 2018-12-06 (×6): qty 1

## 2018-12-06 MED ORDER — BACITRACIN-NEOMYCIN-POLYMYXIN 400-5-5000 EX OINT: 1.0000 "application " | TOPICAL_OINTMENT | CUTANEOUS | Status: DC | PRN

## 2018-12-06 MED ORDER — CEFAZOLIN SODIUM-DEXTROSE 2-4 GM/100ML-% IV SOLN
INTRAVENOUS | Status: AC
  Filled 2018-12-06: qty 100

## 2018-12-06 MED ORDER — FENTANYL CITRATE (PF) 100 MCG/2ML IJ SOLN: 25.0000 ug | INTRAMUSCULAR | Status: DC | PRN

## 2018-12-06 MED ORDER — PANTOPRAZOLE SODIUM 40 MG PO TBEC
40.0000 mg | DELAYED_RELEASE_TABLET | Freq: Every day | ORAL | Status: DC
  Administered 2018-12-07 – 2018-12-08 (×2): 40 mg via ORAL
  Filled 2018-12-06 (×2): qty 1

## 2018-12-06 MED ORDER — CEFAZOLIN SODIUM-DEXTROSE 2-4 GM/100ML-% IV SOLN
2.0000 g | INTRAVENOUS | Status: AC
  Administered 2018-12-06: 2 g via INTRAVENOUS

## 2018-12-06 MED ORDER — APIXABAN 2.5 MG PO TABS: 2.5000 mg | ORAL_TABLET | Freq: Two times a day (BID) | ORAL | 0 refills | Status: DC

## 2018-12-06 MED ORDER — GABAPENTIN 300 MG PO CAPS
300.0000 mg | ORAL_CAPSULE | Freq: Three times a day (TID) | ORAL | Status: DC
  Administered 2018-12-06 – 2018-12-08 (×6): 300 mg via ORAL
  Filled 2018-12-06 (×6): qty 1

## 2018-12-06 MED ORDER — TRANEXAMIC ACID-NACL 1000-0.7 MG/100ML-% IV SOLN
1000.0000 mg | INTRAVENOUS | Status: AC
  Administered 2018-12-06: 10:00:00 1000 mg via INTRAVENOUS

## 2018-12-06 MED ORDER — OXYCODONE HCL 5 MG PO TABS
5.0000 mg | ORAL_TABLET | ORAL | Status: DC | PRN
  Administered 2018-12-06 – 2018-12-08 (×8): 10 mg via ORAL
  Administered 2018-12-08: 5 mg via ORAL
  Filled 2018-12-06 (×8): qty 2
  Filled 2018-12-06: qty 1

## 2018-12-06 MED ORDER — EPHEDRINE 5 MG/ML INJ
INTRAVENOUS | Status: AC
  Filled 2018-12-06: qty 10

## 2018-12-06 MED ORDER — POLYETHYLENE GLYCOL 3350 17 G PO PACK: 17.0000 g | PACK | Freq: Every day | ORAL | Status: DC | PRN

## 2018-12-06 MED ORDER — ONDANSETRON HCL 4 MG/2ML IJ SOLN
INTRAMUSCULAR | Status: DC | PRN
  Administered 2018-12-06: 4 mg via INTRAVENOUS

## 2018-12-06 MED ORDER — DIPHENHYDRAMINE HCL 12.5 MG/5ML PO ELIX: 12.5000 mg | ORAL_SOLUTION | ORAL | Status: DC | PRN

## 2018-12-06 MED ORDER — TIZANIDINE HCL 2 MG PO TABS: 2.0000 mg | ORAL_TABLET | Freq: Four times a day (QID) | ORAL | 0 refills | Status: DC | PRN

## 2018-12-06 MED ORDER — PHENYLEPHRINE 40 MCG/ML (10ML) SYRINGE FOR IV PUSH (FOR BLOOD PRESSURE SUPPORT)
PREFILLED_SYRINGE | INTRAVENOUS | Status: DC | PRN
  Administered 2018-12-06: 80 ug via INTRAVENOUS
  Administered 2018-12-06: 120 ug via INTRAVENOUS
  Administered 2018-12-06: 80 ug via INTRAVENOUS

## 2018-12-06 MED ORDER — SODIUM CHLORIDE (PF) 0.9 % IJ SOLN
INTRAMUSCULAR | Status: DC | PRN
  Administered 2018-12-06: 70 mL via INTRAVENOUS

## 2018-12-06 MED ORDER — PROPOFOL 10 MG/ML IV BOLUS
INTRAVENOUS | Status: AC
  Filled 2018-12-06: qty 80

## 2018-12-06 MED ORDER — TRANEXAMIC ACID 1000 MG/10ML IV SOLN
INTRAVENOUS | Status: DC | PRN
  Administered 2018-12-06: 2000 mg via TOPICAL

## 2018-12-06 MED ORDER — CHLORHEXIDINE GLUCONATE 4 % EX LIQD: 60.0000 mL | Freq: Once | CUTANEOUS | Status: DC

## 2018-12-06 MED ORDER — BUPIVACAINE HCL (PF) 0.25 % IJ SOLN
INTRAMUSCULAR | Status: AC
  Filled 2018-12-06: qty 30

## 2018-12-06 MED ORDER — FENTANYL CITRATE (PF) 100 MCG/2ML IJ SOLN
50.0000 ug | INTRAMUSCULAR | Status: DC
  Administered 2018-12-06: 09:00:00 100 ug via INTRAVENOUS
  Filled 2018-12-06: qty 2

## 2018-12-06 MED ORDER — POVIDONE-IODINE 10 % EX SWAB
2.0000 "application " | Freq: Once | CUTANEOUS | Status: AC
  Administered 2018-12-06: 2 via TOPICAL

## 2018-12-06 MED ORDER — DEXAMETHASONE SODIUM PHOSPHATE 10 MG/ML IJ SOLN
10.0000 mg | Freq: Once | INTRAMUSCULAR | Status: AC
  Administered 2018-12-07: 10 mg via INTRAVENOUS
  Filled 2018-12-06: qty 1

## 2018-12-06 MED ORDER — FENTANYL CITRATE (PF) 100 MCG/2ML IJ SOLN: 50.0000 ug | Freq: Once | INTRAMUSCULAR | Status: DC

## 2018-12-06 MED ORDER — TRANEXAMIC ACID-NACL 1000-0.7 MG/100ML-% IV SOLN
INTRAVENOUS | Status: AC
  Filled 2018-12-06: qty 100

## 2018-12-06 SURGICAL SUPPLY — 51 items
ATTUNE MED DOME PAT 38 KNEE (Knees) ×1 IMPLANT
ATTUNE PS FEM LT SZ 6 CEM KNEE (Femur) ×1 IMPLANT
ATTUNE PSRP INSR SZ6 5 KNEE (Insert) ×1 IMPLANT
BAG DECANTER FOR FLEXI CONT (MISCELLANEOUS) ×2 IMPLANT
BAG ZIPLOCK 12X15 (MISCELLANEOUS) ×2 IMPLANT
BASE TIBIAL ROT PLAT SZ 5 KNEE (Knees) IMPLANT
BLADE SAG 18X100X1.27 (BLADE) ×2 IMPLANT
BLADE SAW SGTL 11.0X1.19X90.0M (BLADE) ×2 IMPLANT
BLADE SURG SZ10 CARB STEEL (BLADE) ×4 IMPLANT
BNDG ELASTIC 6X10 VLCR STRL LF (GAUZE/BANDAGES/DRESSINGS) ×2 IMPLANT
BNDG ELASTIC 6X15 VLCR STRL LF (GAUZE/BANDAGES/DRESSINGS) ×1 IMPLANT
BOWL SMART MIX CTS (DISPOSABLE) ×2 IMPLANT
CEMENT HV SMART SET (Cement) ×4 IMPLANT
COVER SURGICAL LIGHT HANDLE (MISCELLANEOUS) ×2 IMPLANT
COVER WAND RF STERILE (DRAPES) IMPLANT
CUFF TOURN SGL QUICK 34 (TOURNIQUET CUFF) ×1
CUFF TRNQT CYL 34X4.125X (TOURNIQUET CUFF) ×1 IMPLANT
DECANTER SPIKE VIAL GLASS SM (MISCELLANEOUS) ×6 IMPLANT
DRAPE U-SHAPE 47X51 STRL (DRAPES) ×2 IMPLANT
DRSG AQUACEL AG ADV 3.5X10 (GAUZE/BANDAGES/DRESSINGS) ×2 IMPLANT
DURAPREP 26ML APPLICATOR (WOUND CARE) ×3 IMPLANT
ELECT REM PT RETURN 15FT ADLT (MISCELLANEOUS) ×2 IMPLANT
GLOVE BIO SURGEON STRL SZ7.5 (GLOVE) ×2 IMPLANT
GLOVE BIO SURGEON STRL SZ8.5 (GLOVE) ×2 IMPLANT
GLOVE BIOGEL PI IND STRL 8 (GLOVE) ×1 IMPLANT
GLOVE BIOGEL PI IND STRL 9 (GLOVE) ×1 IMPLANT
GLOVE BIOGEL PI INDICATOR 8 (GLOVE) ×1
GLOVE BIOGEL PI INDICATOR 9 (GLOVE) ×1
GOWN STRL REUS W/TWL XL LVL3 (GOWN DISPOSABLE) ×4 IMPLANT
HANDPIECE INTERPULSE COAX TIP (DISPOSABLE) ×1
HOOD PEEL AWAY FLYTE STAYCOOL (MISCELLANEOUS) ×7 IMPLANT
KIT TURNOVER KIT A (KITS) ×1 IMPLANT
NDL HYPO 21X1.5 SAFETY (NEEDLE) ×2 IMPLANT
NEEDLE HYPO 21X1.5 SAFETY (NEEDLE) ×4 IMPLANT
NS IRRIG 1000ML POUR BTL (IV SOLUTION) ×2 IMPLANT
PACK ICE MAXI GEL EZY WRAP (MISCELLANEOUS) ×2 IMPLANT
PACK TOTAL KNEE CUSTOM (KITS) ×2 IMPLANT
PIN DRILL FIX HALF THREAD (BIT) ×1 IMPLANT
PIN STEINMAN FIXATION KNEE (PIN) ×1 IMPLANT
PROTECTOR NERVE ULNAR (MISCELLANEOUS) ×2 IMPLANT
SET HNDPC FAN SPRY TIP SCT (DISPOSABLE) ×1 IMPLANT
SUT VIC AB 1 CTX 36 (SUTURE) ×1
SUT VIC AB 1 CTX36XBRD ANBCTR (SUTURE) ×1 IMPLANT
SUT VIC AB 3-0 CT1 27 (SUTURE) ×3
SUT VIC AB 3-0 CT1 TAPERPNT 27 (SUTURE) ×3 IMPLANT
SYR CONTROL 10ML LL (SYRINGE) ×4 IMPLANT
TIBIAL BASE ROT PLAT SZ 5 KNEE (Knees) ×2 IMPLANT
TRAY FOLEY MTR SLVR 16FR STAT (SET/KITS/TRAYS/PACK) ×2 IMPLANT
WATER STERILE IRR 1000ML POUR (IV SOLUTION) ×4 IMPLANT
WRAP KNEE MAXI GEL POST OP (GAUZE/BANDAGES/DRESSINGS) ×1 IMPLANT
YANKAUER SUCT BULB TIP 10FT TU (MISCELLANEOUS) ×2 IMPLANT

## 2018-12-06 NOTE — Anesthesia Procedure Notes (Signed)
Anesthesia Regional Block: Adductor canal block   Pre-Anesthetic Checklist: ,, timeout performed, Correct Patient, Correct Site, Correct Laterality, Correct Procedure, Correct Position, site marked, Risks and benefits discussed,  Surgical consent,  Pre-op evaluation,  At surgeon's request and post-op pain management  Laterality: Left  Prep: Maximum Sterile Barrier Precautions used, chloraprep       Needles:  Injection technique: Single-shot  Needle Type: Echogenic Stimulator Needle     Needle Length: 9cm  Needle Gauge: 22     Additional Needles:   Procedures:,,,, ultrasound used (permanent image in chart),,,,  Narrative:  Start time: 12/06/2018 9:22 AM End time: 12/06/2018 9:32 AM Injection made incrementally with aspirations every 5 mL.  Performed by: Personally  Anesthesiologist: Freddrick March, MD  Additional Notes: Monitors applied. No increased pain on injection. No increased resistance to injection. Injection made in 5cc increments. Good needle visualization. Patient tolerated procedure well.

## 2018-12-06 NOTE — Progress Notes (Signed)
Assisted Dr. Lanetta Inch with left, ultrasound guided, adductor canal block. Side rails up, monitors on throughout procedure. See vital signs in flow sheet. Tolerated Procedure well.

## 2018-12-06 NOTE — Anesthesia Procedure Notes (Signed)
Procedure Name: MAC Date/Time: 12/06/2018 9:33 AM Performed by: West Pugh, CRNA Pre-anesthesia Checklist: Patient identified, Emergency Drugs available, Suction available, Patient being monitored and Timeout performed Patient Re-evaluated:Patient Re-evaluated prior to induction Oxygen Delivery Method: Simple face mask Preoxygenation: Pre-oxygenation with 100% oxygen Induction Type: IV induction Placement Confirmation: positive ETCO2 Dental Injury: Teeth and Oropharynx as per pre-operative assessment

## 2018-12-06 NOTE — Evaluation (Signed)
Physical Therapy Evaluation Patient Details Name: Cassidy [name] MRN: [id number] DOB: 1940-10-29 Today's Date: 12/06/2018   History of Present Illness  78 yo female s/p  TKR on 12/06/18. PMH includes melanoma, hypothyroidism, HTN, venous insufficiency, obesity, DVT and PE, L ankle fusion, L foot second ray amputation, R TKR 2010.  Clinical Impression   Pt presents with L knee pain, decreased L knee ROM, increased time and effort to perform mobility tasks, and decreased activity tolerance post-operatively. Pt to benefit from acute PT to address deficits. Pt ambulated hallway distance with RW with min guard assist, verbal cuing for form and safety provided throughout. Pt educated on ankle pumps (20/hour) to perform this afternoon/evening to increase circulation, to pt's tolerance and limited by pain. PT to progress mobility as tolerated, and will continue to follow acutely.        Follow Up Recommendations Follow surgeon's recommendation for DC plan and follow-up therapies;Supervision for mobility/OOB(HHPT)    Equipment Recommendations  Rolling walker with 5" wheels    Recommendations for Other Services       Precautions / Restrictions Precautions Precautions: Fall Restrictions Weight Bearing Restrictions: No Other Position/Activity Restrictions: WBAT      Mobility  Bed Mobility Overal bed mobility: Needs Assistance Bed Mobility: Supine to Sit     Supine to sit: Min guard;HOB elevated     General bed mobility comments: Min guard for safety, verbal cuing for sequencing.  Transfers Overall transfer level: Needs assistance Equipment used: Rolling walker (2 wheeled) Transfers: Sit to/from Stand Sit to Stand: Min guard;From elevated surface         General transfer comment: Min guard for safety, verbal cuing for hand placement when rising.  Ambulation/Gait Ambulation/Gait assistance: Min guard;+2 safety/equipment Gait Distance (Feet): 60 Feet Assistive device: Rolling  walker (2 wheeled) Gait Pattern/deviations: Step-to pattern;Decreased step length - right;Decreased step length - left;Decreased weight shift to left;Decreased stance time - left;Antalgic Gait velocity: decr   General Gait Details: Min guard for safety, verbal cuing for sequencing, placement in RW x2, turning with RW.  Stairs            Wheelchair Mobility    Modified Rankin (Stroke Patients Only)       Balance Overall balance assessment: Mild deficits observed, not formally tested                                           Pertinent Vitals/Pain Pain Assessment: 0-10 Pain Score: 2  Pain Location: L knee Pain Descriptors / Indicators: Sore Pain Intervention(s): Limited activity within patient's tolerance;Monitored during session;Premedicated before session;Repositioned;Ice applied    Home Living Family/patient expects to be discharged to:: Private residence Living Arrangements: Alone Available Help at Discharge: Family;Available 24 hours/day(children and grandchildren to assist) Type of Home: House Home Access: Level entry     Home Layout: One level Home Equipment: Cane - single point;Shower seat - built in      Prior Function Level of Independence: Independent               Hand Dominance   Dominant Hand: Right    Extremity/Trunk Assessment   Upper Extremity Assessment Upper Extremity Assessment: Overall WFL for tasks assessed    Lower Extremity Assessment Lower Extremity Assessment: Overall WFL for tasks assessed;LLE deficits/detail LLE Deficits / Details: suspected post-surgical weakness; able to perform ankle pumps, quad set, heel slide to 45*,  SLR without lift assist or quad lag LLE Sensation: WNL    Cervical / Trunk Assessment Cervical / Trunk Assessment: Normal  Communication   Communication: No difficulties  Cognition Arousal/Alertness: Awake/alert Behavior During Therapy: WFL for tasks assessed/performed Overall  Cognitive Status: Within Functional Limits for tasks assessed                                        General Comments      Exercises     Assessment/Plan    PT Assessment Patient needs continued PT services  PT Problem List Decreased mobility;Decreased strength;Decreased range of motion;Decreased activity tolerance;Decreased balance;Decreased knowledge of use of DME;Pain       PT Treatment Interventions DME instruction;Therapeutic activities;Gait training;Therapeutic exercise;Patient/family education;Balance training;Stair training;Functional mobility training    PT Goals (Current goals can be found in the Care Plan section)  Acute Rehab PT Goals Patient Stated Goal: go home, return to independence PT Goal Formulation: With patient Time For Goal Achievement: 12/13/18 Potential to Achieve Goals: Good    Frequency 7X/week   Barriers to discharge        Co-evaluation               AM-PAC PT "6 Clicks" Mobility  Outcome Measure Help needed turning from your back to your side while in a flat bed without using bedrails?: A Little Help needed moving from lying on your back to sitting on the side of a flat bed without using bedrails?: A Little Help needed moving to and from a bed to a chair (including a wheelchair)?: A Little Help needed standing up from a chair using your arms (e.g., wheelchair or bedside chair)?: A Little Help needed to walk in hospital room?: A Little Help needed climbing 3-5 steps with a railing? : A Lot 6 Click Score: 17    End of Session Equipment Utilized During Treatment: Gait belt Activity Tolerance: Patient tolerated treatment well Patient left: in chair;with chair alarm set;with call bell/phone within reach;with SCD's reapplied Nurse Communication: Mobility status PT Visit Diagnosis: Other abnormalities of gait and mobility (R26.89);Difficulty in walking, not elsewhere classified (R26.2)    Time: 0370-4888 PT Time  Calculation (min) (ACUTE ONLY): 23 min   Charges:   PT Evaluation $PT Eval Low Complexity: 1 Low PT Treatments $Gait Training: 8-22 mins        Julien Girt, PT Acute Rehabilitation Services Pager [phone]  Office [phone]  Rebbecca Osuna D Elonda Husky 12/06/2018, 4:58 PM

## 2018-12-06 NOTE — Anesthesia Preprocedure Evaluation (Addendum)
Anesthesia Evaluation  Patient identified by MRN, date of birth, ID band Patient awake    Reviewed: Allergy & Precautions, NPO status , Patient's Chart, lab work & pertinent test results  History of Anesthesia Complications (+) PONV and history of anesthetic complications  Airway Mallampati: I  TM Distance: >3 FB Neck ROM: Full    Dental no notable dental hx. (+) Chipped, Dental Advisory Given,    Pulmonary PE   Pulmonary exam normal breath sounds clear to auscultation       Cardiovascular hypertension, + DVT  Normal cardiovascular exam Rhythm:Regular Rate:Normal  HLD   Neuro/Psych  Headaches, PSYCHIATRIC DISORDERS Anxiety    GI/Hepatic negative GI ROS, Neg liver ROS,   Endo/Other  Hypothyroidism   Renal/GU negative Renal ROS  negative genitourinary   Musculoskeletal  (+) Arthritis ,   Abdominal   Peds  Hematology negative hematology ROS (+)   Anesthesia Other Findings   Reproductive/Obstetrics                           Anesthesia Physical Anesthesia Plan  ASA: III  Anesthesia Plan: Spinal and Regional   Post-op Pain Management:  Regional for Post-op pain   Induction:   PONV Risk Score and Plan: 3 and Treatment may vary due to age or medical condition and Propofol infusion  Airway Management Planned: Natural Airway  Additional Equipment:   Intra-op Plan:   Post-operative Plan:   Informed Consent: I have reviewed the patients History and Physical, chart, labs and discussed the procedure including the risks, benefits and alternatives for the proposed anesthesia with the patient or authorized representative who has indicated his/her understanding and acceptance.     Dental advisory given  Plan Discussed with: CRNA  Anesthesia Plan Comments:         Anesthesia Quick Evaluation

## 2018-12-06 NOTE — Anesthesia Postprocedure Evaluation (Signed)
Anesthesia Post Note  Patient: Cassidy [name]  Procedure(s) Performed: Left Knee Arthroplasty (Left Knee)     Patient location during evaluation: PACU Anesthesia Type: Regional and Spinal Level of consciousness: oriented and awake and alert Pain management: pain level controlled Vital Signs Assessment: post-procedure vital signs reviewed and stable Respiratory status: spontaneous breathing, respiratory function stable and patient connected to nasal cannula oxygen Cardiovascular status: blood pressure returned to baseline and stable Postop Assessment: no headache, no backache and no apparent nausea or vomiting Anesthetic complications: no    Last Vitals:  Vitals:   12/06/18 0927 12/06/18 1151  BP:  128/70  Pulse: 97 87  Resp:  18  Temp:  (!) 36.3 C  SpO2: 100% 99%    Last Pain:  Vitals:   12/06/18 1151  TempSrc:   PainSc: 0-No pain                 Rafia Shedden L Smayan Hackbart

## 2018-12-06 NOTE — Op Note (Signed)
PATIENT ID:      Cassidy [name]  MRN:     [id number] DOB/AGE:    09/23/1940 / 78 y.o.       OPERATIVE REPORT    DATE OF PROCEDURE:  12/06/2018       PREOPERATIVE DIAGNOSIS:   Left Knee Osteoarthitis      Estimated body mass index is 38.81 kg/m as calculated from the following:   Height as of 12/01/18: 5\' 5"  (1.651 m).   Weight as of 12/01/18: 105.8 kg.                                                        POSTOPERATIVE DIAGNOSIS:   L Knee OA                                                                     PROCEDURE:  Procedure(s): Left Knee Arthroplasty Using DepuyAttune RP implants #6L Femur, #5Tibia, 5 mm Attune RP bearing, 38 Patella     SURGEON: Kerin Salen    ASSISTANT:   Kerry Hough. [employer]   (Present and scrubbed throughout the case, critical for assistance with exposure, retraction, instrumentation, and closure.)         ANESTHESIA: Spinal, 20cc Exparel, 50cc 0.25% Marcaine  EBL: 300 cc  FLUID REPLACEMENT: 1600 cc crystaloid  Tourniquet Time: None  Drains: None  Tranexamic Acid: 1gm IV, 2gm topical  Exparel: 266mg    COMPLICATIONS:  None         INDICATIONS FOR PROCEDURE: The patient has  Left Knee Osteoarthitis, Var deformities, XR shows bone on bone arthritis, lateral subluxation of tibia. Patient has failed all conservative measures including anti-inflammatory medicines, narcotics, attempts at exercise and weight loss, cortisone injections and viscosupplementation.  Risks and benefits of surgery have been discussed, questions answered.   DESCRIPTION OF PROCEDURE: The patient identified by armband, received  IV antibiotics, in the holding area at [facility]. Patient taken to the operating room, appropriate anesthetic monitors were attached, and Spinal anesthesia was  induced. IV Tranexamic acid was given.Tourniquet applied high to the operative thigh. Lateral post and foot positioner applied to the table, the lower extremity was then prepped and draped in  usual sterile fashion from the toes to the tourniquet. Time-out procedure was performed. The skin and subcutaneous tissue along the incision was injected with 20 cc of a mixture of Exparel and Marcaine solution, using a 20-gauge by 1-1/2 inch needle. We began the operation, with the knee flexed 130 degrees, by making the anterior midline incision starting at handbreadth above the patella going over the patella 1 cm medial to and 4 cm distal to the tibial tubercle. Small bleeders in the skin and the subcutaneous tissue identified and cauterized. Transverse retinaculum was incised and reflected medially and a medial parapatellar arthrotomy was accomplished. the patella was everted and theprepatellar fat pad resected. The superficial medial collateral ligament was then elevated from anterior to posterior along the proximal flare of the tibia and anterior half of the menisci resected. The knee was hyperflexed exposing bone on bone arthritis.  Peripheral and notch osteophytes as well as the cruciate ligaments were then resected. We continued to work our way around posteriorly along the proximal tibia, and externally rotated the tibia subluxing it out from underneath the femur. A McHale retractor was placed through the notch and a lateral Hohmann retractor placed, and we then drilled through the proximal tibia in line with the axis of the tibia followed by an intramedullary guide rod and 2-degree posterior slope cutting guide. The tibial cutting guide, 4 degree posterior sloped, was pinned into place allowing resection of 2 mm of bone medially and 8 mm of bone laterally. Satisfied with the tibial resection, we then entered the distal femur 2 mm anterior to the PCL origin with the intramedullary guide rod and applied the distal femoral cutting guide set at 9 mm, with 5 degrees of valgus. This was pinned along the epicondylar axis. At this point, the distal femoral cut was accomplished without difficulty. We then sized for a  #6L femoral component and pinned the guide in 3 degrees of external rotation. The chamfer cutting guide was pinned into place. The anterior, posterior, and chamfer cuts were accomplished without difficulty followed by the Attune RP box cutting guide and the box cut. We also removed posterior osteophytes from the posterior femoral condyles. The posterior capsule was injected with Exparel solution. The knee was brought into full extension. We checked our extension gap and fit a 5 mm bearing. Distracting in extension with a lamina spreader,  bleeders in the posterior capsule, Posterior medial and posterior lateral right down and cauterized.  The transexamic acid-soaked sponge was then placed in the gap of the knee and extension. The knee was flexed 30. The posterior patella cut was accomplished with the 9.5 mm Attune cutting guide, sized for a 55mm dome, and the fixation pegs drilled.The knee was then once again hyperflexed exposing the proximal tibia. We sized for a # 5 tibial base plate, applied the smokestack and the conical reamer followed by the the Delta fin keel punch. We then hammered into place the Attune RP trial femoral component, drilled the lugs, inserted a  5 mm trial bearing, trial patellar button, and took the knee through range of motion from 0-130 degrees. Medial and lateral ligamentous stability was checked. No thumb pressure was required for patellar Tracking. The tourniquet was inflated to 319mm for 1min. All trial components were removed, mating surfaces irrigated with pulse lavage, and dried with suction and sponges. 10 cc of the Exparel solution was applied to the cancellus bone of the patella distal femur and proximal tibia.  After waiting 30 seconds, the bony surfaces were again, dried with sponges. A double batch of DePuy HV cement was mixed and applied to all bony metallic mating surfaces except for the posterior condyles of the femur itself. In order, we hammered into place the tibial tray  and removed excess cement, the femoral component and removed excess cement. The final Attune RP bearing was inserted, and the knee brought to full extension with compression. The patellar button was clamped into place, and excess cement removed. The knee was held at 30 flexion with compression, while the cement cured. The wound was irrigated out with normal saline solution pulse lavage. The rest of the Exparel was injected into the parapatellar arthrotomy, subcutaneous tissues, and periosteal tissues. The parapatellar arthrotomy was closed with running #1 Vicryl suture. The subcutaneous tissue with 0 and 2-0 undyed Vicryl suture, and the skin with running 3-0 SQ vicryl. An Aquacil and  Ace wrap were applied. The patient was taken to recovery room without difficulty.   Kerin Salen 12/06/2018, 7:15 AM

## 2018-12-06 NOTE — Care Plan (Signed)
Ortho Bundle Case Management Note  Patient Details  Name: Cassidy [name] MRN: [id number] Date of Birth: Aug 23, 1940  Spoke with patient prior to surgery. She will discharge to home with family to assist and HHPT. [facility]erral to [facility]. Rolling walker ordered. Patient and MD agreeable to plan. Choice offered.                       DME Arranged:  [place] rolling DME Agency:  [employer]  HH Arranged:  PT [place] Agency:  [facility] ([facility]  Additional Comments: Please contact me with any questions of if this plan should need to change.  Ladell Heads,  [place] [facility]ist  [phone] 12/06/2018, 9:25 AM

## 2018-12-06 NOTE — Anesthesia Procedure Notes (Signed)
Spinal  Patient location during procedure: OR Start time: 12/06/2018 9:34 AM End time: 12/06/2018 9:44 AM Staffing Anesthesiologist: Freddrick March, MD Performed: anesthesiologist  Preanesthetic Checklist Completed: patient identified, surgical consent, pre-op evaluation, timeout performed, IV checked, risks and benefits discussed and monitors and equipment checked Spinal Block Patient position: sitting Prep: site prepped and draped and DuraPrep Patient monitoring: cardiac monitor, continuous pulse ox and blood pressure Approach: midline Location: L3-4 Injection technique: single-shot Needle Needle type: Quincke  Needle gauge: 22 G Needle length: 9 cm Assessment Sensory level: T6 Additional Notes Functioning IV was confirmed and monitors were applied. Sterile prep and drape, including hand hygiene and sterile gloves were used. The patient was positioned and the spine was prepped. The skin was anesthetized with lidocaine.  Free flow of clear CSF was obtained prior to injecting local anesthetic into the CSF.  The spinal needle aspirated freely following injection.  The needle was carefully withdrawn.  The patient tolerated the procedure well.

## 2018-12-06 NOTE — Transfer of Care (Signed)
Immediate Anesthesia Transfer of Care Note  Patient: Cassidy [name]  Procedure(s) Performed: Left Knee Arthroplasty (Left Knee)  Patient Location: PACU  Anesthesia Type:Spinal and MAC combined with regional for post-op pain  Level of Consciousness: awake, alert , oriented and patient cooperative  Airway & Oxygen Therapy: Patient Spontanous Breathing and Patient connected to face mask oxygen  Post-op Assessment: Report given to RN and Post -op Vital signs reviewed and stable  Post vital signs: Reviewed and stable  Last Vitals:  Vitals Value Taken Time  BP 128/70 12/06/18 1151  Temp    Pulse 86 12/06/18 1154  Resp 20 12/06/18 1154  SpO2 99 % 12/06/18 1154  Vitals shown include unvalidated device data.  Last Pain:  Vitals:   12/06/18 0840  TempSrc: Oral  PainSc:          Complications: No apparent anesthesia complications

## 2018-12-06 NOTE — Discharge Instructions (Addendum)
INSTRUCTIONS AFTER JOINT REPLACEMENT  ° °o Remove items at home which could result in a fall. This includes throw rugs or furniture in walking pathways °o ICE to the affected joint every three hours while awake for 30 minutes at a time, for at least the first 3-5 days, and then as needed for pain and swelling.  Continue to use ice for pain and swelling. You may notice swelling that will progress down to the foot and ankle.  This is normal after surgery.  Elevate your leg when you are not up walking on it.   °o Continue to use the breathing machine you got in the hospital (incentive spirometer) which will help keep your temperature down.  It is common for your temperature to cycle up and down following surgery, especially at night when you are not up moving around and exerting yourself.  The breathing machine keeps your lungs expanded and your temperature down. ° ° °DIET:  As you were doing prior to hospitalization, we recommend a well-balanced diet. ° °DRESSING / WOUND CARE / SHOWERING ° °Keep the surgical dressing until follow up.  The dressing is water proof, so you can shower without any extra covering.  IF THE DRESSING FALLS OFF or the wound gets wet inside, change the dressing with sterile gauze.  Please use good hand washing techniques before changing the dressing.  Do not use any lotions or creams on the incision until instructed by your surgeon.   ° °ACTIVITY ° °o Increase activity slowly as tolerated, but follow the weight bearing instructions below.   °o No driving for 6 weeks or until further direction given by your physician.  You cannot drive while taking narcotics.  °o No lifting or carrying greater than 10 lbs. until further directed by your surgeon. °o Avoid periods of inactivity such as sitting longer than an hour when not asleep. This helps prevent blood clots.  °o You may return to work once you are authorized by your doctor.  ° ° ° °WEIGHT BEARING  ° °Weight bearing as tolerated with assist  device (walker, cane, etc) as directed, use it as long as suggested by your surgeon or therapist, typically at least 4-6 weeks. ° ° °EXERCISES ° °Results after joint replacement surgery are often greatly improved when you follow the exercise, range of motion and muscle strengthening exercises prescribed by your doctor. Safety measures are also important to protect the joint from further injury. Any time any of these exercises cause you to have increased pain or swelling, decrease what you are doing until you are comfortable again and then slowly increase them. If you have problems or questions, call your caregiver or physical therapist for advice.  ° °Rehabilitation is important following a joint replacement. After just a few days of immobilization, the muscles of the leg can become weakened and shrink (atrophy).  These exercises are designed to build up the tone and strength of the thigh and leg muscles and to improve motion. Often times heat used for twenty to thirty minutes before working out will loosen up your tissues and help with improving the range of motion but do not use heat for the first two weeks following surgery (sometimes heat can increase post-operative swelling).  ° °These exercises can be done on a training (exercise) mat, on the floor, on a table or on a bed. Use whatever works the best and is most comfortable for you.    Use music or television while you are exercising so that   the exercises are a pleasant break in your day. This will make your life better with the exercises acting as a break in your routine that you can look forward to.   Perform all exercises about fifteen times, three times per day or as directed.  You should exercise both the operative leg and the other leg as well. ° °Exercises include: °  °• Quad Sets - Tighten up the muscle on the front of the thigh (Quad) and hold for 5-10 seconds.   °• Straight Leg Raises - With your knee straight (if you were given a brace, keep it on),  lift the leg to 60 degrees, hold for 3 seconds, and slowly lower the leg.  Perform this exercise against resistance later as your leg gets stronger.  °• Leg Slides: Lying on your back, slowly slide your foot toward your buttocks, bending your knee up off the floor (only go as far as is comfortable). Then slowly slide your foot back down until your leg is flat on the floor again.  °• Angel Wings: Lying on your back spread your legs to the side as far apart as you can without causing discomfort.  °• Hamstring Strength:  Lying on your back, push your heel against the floor with your leg straight by tightening up the muscles of your buttocks.  Repeat, but this time bend your knee to a comfortable angle, and push your heel against the floor.  You may put a pillow under the heel to make it more comfortable if necessary.  ° °A rehabilitation program following joint replacement surgery can speed recovery and prevent re-injury in the future due to weakened muscles. Contact your doctor or a physical therapist for more information on knee rehabilitation.  ° ° °CONSTIPATION ° °Constipation is defined medically as fewer than three stools per week and severe constipation as less than one stool per week.  Even if you have a regular bowel pattern at home, your normal regimen is likely to be disrupted due to multiple reasons following surgery.  Combination of anesthesia, postoperative narcotics, change in appetite and fluid intake all can affect your bowels.  ° °YOU MUST use at least one of the following options; they are listed in order of increasing strength to get the job done.  They are all available over the counter, and you may need to use some, POSSIBLY even all of these options:   ° °Drink plenty of fluids (prune juice may be helpful) and high fiber foods °Colace 100 mg by mouth twice a day  °Senokot for constipation as directed and as needed Dulcolax (bisacodyl), take with full glass of water  °Miralax (polyethylene glycol)  once or twice a day as needed. ° °If you have tried all these things and are unable to have a bowel movement in the first 3-4 days after surgery call either your surgeon or your primary doctor.   ° °If you experience loose stools or diarrhea, hold the medications until you stool forms back up.  If your symptoms do not get better within 1 week or if they get worse, check with your doctor.  If you experience "the worst abdominal pain ever" or develop nausea or vomiting, please contact the office immediately for further recommendations for treatment. ° ° °ITCHING:  If you experience itching with your medications, try taking only a single pain pill, or even half a pain pill at a time.  You can also use Benadryl over the counter for itching or also to   help with sleep.   TED HOSE STOCKINGS:  Use stockings on both legs until for at least 2 weeks or as directed by physician office. They may be removed at night for sleeping.  MEDICATIONS:  See your medication summary on the After Visit Summary that nursing will review with you.  You may have some home medications which will be placed on hold until you complete the course of blood thinner medication.  It is important for you to complete the blood thinner medication as prescribed.  PRECAUTIONS:  If you experience chest pain or shortness of breath - call 911 immediately for transfer to the hospital emergency department.   If you develop a fever greater that 101 F, purulent drainage from wound, increased redness or drainage from wound, foul odor from the wound/dressing, or calf pain - CONTACT YOUR SURGEON.                                                   FOLLOW-UP APPOINTMENTS:  If you do not already have a post-op appointment, please call the office for an appointment to be seen by your surgeon.  Guidelines for how soon to be seen are listed in your After Visit Summary, but are typically between 1-4 weeks after surgery.  OTHER INSTRUCTIONS:   Knee  Replacement:  Do not place pillow under knee, focus on keeping the knee straight while resting. CPM instructions: 0-90 degrees, 2 hours in the morning, 2 hours in the afternoon, and 2 hours in the evening. Place foam block, curve side up under heel at all times except when in CPM or when walking.  DO NOT modify, tear, cut, or change the foam block in any way.  MAKE SURE YOU:   Understand these instructions.   Get help right away if you are not doing well or get worse.    Thank you for letting us be a part of your medical care team.  It is a privilege we respect greatly.  We hope these instructions will help you stay on track for a fast and full recovery!    Information on my medicine - ELIQUIS (apixaban)  This medication education was reviewed with me or my healthcare representative as part of my discharge preparation.  The pharmacist that spoke with me during my hospital stay was:    Why was Eliquis prescribed for you? Eliquis was prescribed for you to reduce the risk of blood clots forming after orthopedic surgery.    What do You need to know about Eliquis? Take your Eliquis TWICE DAILY - one tablet in the morning and one tablet in the evening with or without food.  It would be best to take the dose about the same time each day.  If you have difficulty swallowing the tablet whole please discuss with your pharmacist how to take the medication safely.  Take Eliquis exactly as prescribed by your doctor and DO NOT stop taking Eliquis without talking to the doctor who prescribed the medication.  Stopping without other medication to take the place of Eliquis may increase your risk of developing a clot.  After discharge, you should have regular check-up appointments with your healthcare provider that is prescribing your Eliquis.  What do you do if you miss a dose? If a dose of ELIQUIS is not taken at the scheduled time, take it as  soon as possible on the same day and twice-daily  administration should be resumed.  The dose should not be doubled to make up for a missed dose.  Do not take more than one tablet of ELIQUIS at the same time.  Important Safety Information A possible side effect of Eliquis is bleeding. You should call your healthcare provider right away if you experience any of the following: ? Bleeding from an injury or your nose that does not stop. ? Unusual colored urine (red or dark brown) or unusual colored stools (red or black). ? Unusual bruising for unknown reasons. ? A serious fall or if you hit your head (even if there is no bleeding).  Some medicines may interact with Eliquis and might increase your risk of bleeding or clotting while on Eliquis. To help avoid this, consult your healthcare provider or pharmacist prior to using any new prescription or non-prescription medications, including herbals, vitamins, non-steroidal anti-inflammatory drugs (NSAIDs) and supplements.  This website has more information on Eliquis (apixaban): http://www.eliquis.com/eliquis/home

## 2018-12-06 NOTE — Interval H&P Note (Signed)
History and Physical Interval Note:  12/06/2018 9:17 AM  [place]  has presented today for surgery, with the diagnosis of Left Knee Osteoarthitis.  The various methods of treatment have been discussed with the patient and family. After consideration of risks, benefits and other options for treatment, the patient has consented to  Procedure(s): Left Knee Arthroplasty (Left) as a surgical intervention.  The patient's history has been reviewed, patient examined, no change in status, stable for surgery.  I have reviewed the patient's chart and labs.  Questions were answered to the patient's satisfaction.     Kerin Salen

## 2018-12-07 ENCOUNTER — Encounter (HOSPITAL_COMMUNITY): Payer: Self-pay | Admitting: Orthopedic Surgery

## 2018-12-07 LAB — BASIC METABOLIC PANEL
Anion gap: 6 (ref 5–15)
BUN: 14 mg/dL (ref 8–23)
CO2: 25 mmol/L (ref 22–32)
Calcium: 8.2 mg/dL — ABNORMAL LOW (ref 8.9–10.3)
Chloride: 107 mmol/L (ref 98–111)
Creatinine, Ser: 0.77 mg/dL (ref 0.44–1.00)
GFR calc Af Amer: >60 mL/min (ref >60)
GFR calc non Af Amer: >60 mL/min (ref >60)
Glucose, Bld: 163 mg/dL — ABNORMAL HIGH (ref 70–99)
Potassium: 3.6 mmol/L (ref 3.5–5.1)
Sodium: 138 mmol/L (ref 135–145)

## 2018-12-07 LAB — CBC
HCT: 35.3 % — ABNORMAL LOW (ref 36.0–46.0)
Hemoglobin: 11.1 g/dL — ABNORMAL LOW (ref 12.0–15.0)
MCH: 30 pg (ref 26.0–34.0)
MCHC: 31.4 g/dL (ref 30.0–36.0)
MCV: 95.4 fL (ref 80.0–100.0)
Platelets: 184 10*3/uL (ref 150–400)
RBC: 3.7 MIL/uL — ABNORMAL LOW (ref 3.87–5.11)
RDW: 13.5 % (ref 11.5–15.5)
WBC: 7.9 10*3/uL (ref 4.0–10.5)
nRBC: 0 % (ref 0.0–0.2)

## 2018-12-07 NOTE — Progress Notes (Addendum)
Physical Therapy Treatment Patient Details Name: Cassidy [name] MRN: [id number] DOB: [birth year] Today's Date: 12/07/2018    History of Present Illness 78 yo female s/p  TKR on 12/06/18. PMH includes melanoma, hypothyroidism, HTN, venous insufficiency, obesity, DVT and PE, L ankle fusion, L foot second ray amputation, R TKR 2010.    PT Comments    Pt declined mobility stating she was worried about too much pain.  Pt agreeable to perform exercises and appeared to tolerate exercises well.  Pt also talking throughout session.  Pt again declined mobility until later in afternoon.    Follow Up Recommendations  Follow surgeon's recommendation for DC plan and follow-up therapies;Supervision for mobility/OOB     Equipment Recommendations  Rolling walker with 5" wheels    Recommendations for Other Services       Precautions / Restrictions Precautions Precautions: Fall;Knee Restrictions Other Position/Activity Restrictions: WBAT    Mobility  Bed Mobility               General bed mobility comments: pt up in recliner on arrival, pt declined standing at this time as she anticipates increased pain (pain meds due  around lunchtime)  Transfers                    Ambulation/Gait                 Stairs             Wheelchair Mobility    Modified Rankin (Stroke Patients Only)       Balance                                            Cognition Arousal/Alertness: Awake/alert Behavior During Therapy: WFL for tasks assessed/performed Overall Cognitive Status: Within Functional Limits for tasks assessed                                 General Comments: very loquatious      Exercises Total Joint Exercises Ankle Circles/Pumps: AROM;10 reps;Supine;Both Quad Sets: AROM;Left;10 reps Heel Slides: AAROM;Left;10 reps Hip ABduction/ADduction: AAROM;10 reps;Left Straight Leg Raises: AAROM;Left;10 reps    General Comments         Pertinent Vitals/Pain Pain Assessment: 0-10 Pain Score: 5  Pain Location: L knee Pain Descriptors / Indicators: Sore;Aching Pain Intervention(s): Monitored during session;Limited activity within patient's tolerance;Repositioned;Ice applied    Home Living                      Prior Function            PT Goals (current goals can now be found in the care plan section) Progress towards PT goals: Progressing toward goals    Frequency    7X/week      PT Plan Current plan remains appropriate    Co-evaluation              AM-PAC PT "6 Clicks" Mobility   Outcome Measure  Help needed turning from your back to your side while in a flat bed without using bedrails?: A Little Help needed moving from lying on your back to sitting on the side of a flat bed without using bedrails?: A Little Help needed moving to and from a bed to a chair (including a wheelchair)?: A Little  Help needed standing up from a chair using your arms (e.g., wheelchair or bedside chair)?: A Little Help needed to walk in hospital room?: A Little Help needed climbing 3-5 steps with a railing? : A Lot 6 Click Score: 17    End of Session   Activity Tolerance: Patient tolerated treatment well Patient left: in chair;with call bell/phone within reach   PT Visit Diagnosis: Other abnormalities of gait and mobility (R26.89);Difficulty in walking, not elsewhere classified (R26.2)     Time: 1126-1140 PT Time Calculation (min) (ACUTE ONLY): 14 min  Charges:  Therapeutic Exercise 8-22 min                    Carmelia Bake, PT, DPT Acute Rehabilitation Services Office: [phone] Pager: [phone]  Trena Platt 12/07/2018, 12:40 PM

## 2018-12-07 NOTE — TOC Initial Note (Signed)
Transition of Care [facility]) - Initial/Assessment Note    Patient Details  Name: Cassidy [name] MRN: [id number] Date of Birth: [birth year]  Transition of Care (TOC) CM/SW Contact:    Joaquin Courts, RN Phone Number: 12/07/2018, 9:45 AM  Clinical Narrative:   CM spoke with patient at bedside. Patient set up with [facility] for [place]. Patient reports she has a rolling walker and declines 3-in-1, states she has raised seat and bars and is able to get to her bathroom safely.                  Expected Discharge Plan: [place] Barriers to Discharge: Continued Medical Work up   Patient Goals and CMS Choice Patient states their goals for this hospitalization and ongoing recovery are:: to go home with therapy CMS Medicare.gov Compare Post Acute Care list provided to:: Patient Choice offered to / list presented to : Patient  Expected Discharge Plan and Services Expected Discharge Plan: [place]   Discharge Planning Services: CM Consult Post Acute Care Choice: [place] arrangements for the past 2 months: Apartment                 DME Arranged: Walker rolling DME Agency: [employer]       HH Arranged: PT [place] Agency: [facility] [employer] [facility] [employer]     Representative spoke with at [place]: pre arranged by MD office  Prior Living Arrangements/Services Living arrangements for the past 2 months: Apartment Lives with:: Self Patient language and need for interpreter reviewed:: Yes Do you feel safe going back to the place where you live?: Yes      Need for Family Participation in Patient Care: Yes (Comment) Care giver support system in place?: Yes (comment)   Criminal Activity/Legal Involvement Pertinent to Current Situation/Hospitalization: No - Comment as needed  Activities of Daily Living Home Assistive Devices/Equipment: Eyeglasses, Cane (specify quad or straight) ADL Screening (condition at time of  admission) Patient's cognitive ability adequate to safely complete daily activities?: Yes Is the patient deaf or have difficulty hearing?: No Does the patient have difficulty seeing, even when wearing glasses/contacts?: No Does the patient have difficulty concentrating, remembering, or making decisions?: No Patient able to express need for assistance with ADLs?: Yes Does the patient have difficulty dressing or bathing?: No Independently performs ADLs?: Yes (appropriate for developmental age) Does the patient have difficulty walking or climbing stairs?: No Weakness of Legs: None Weakness of Arms/Hands: None  Permission Sought/Granted                  Emotional Assessment Appearance:: Appears stated age Attitude/Demeanor/Rapport: Engaged Affect (typically observed): Accepting Orientation: : Oriented to Self, Oriented to Place, Oriented to  Time, Oriented to Situation   Psych Involvement: No (comment)  Admission diagnosis:  Left Knee Osteoarthitis Patient Active Problem List   Diagnosis Date Noted  . Total knee replacement status, left 12/06/2018  . Goiter 12/04/2018  . Degenerative arthritis of left knee 12/02/2018  . Impaired fasting glucose 05/10/2018  . Encounter for cosmetic surgery 07/16/2015  . Melanoma of foot ([place]) 12/13/2014  . HAV (hallux abducto valgus) 11/28/2014  . Venous insufficiency (chronic) (peripheral) 07/05/2014  . Lymphedema of both lower extremities 06/28/2014  . Venous hypertension of both lower extremities 06/28/2014  . Melanoma ([place]) 01/20/2014  . Hypothyroidism due to acquired atrophy of thyroid 01/04/2014  . Onychomycosis 02/10/2012  . History of impaired glucose tolerance 11/02/2011  .  History of pulmonary embolism 11/02/2011  . S/P right unicompartmental knee replacement 11/02/2011  . Hypertension 08/27/2010  . Hypothyroidism 08/27/2010  . Obesity 08/27/2010  . Migraine 08/27/2010   PCP:  Elby Showers, MD Pharmacy:   [facility], [place] - [address] [place] [address] Phone: [phone] Fax: [phone]     Social Determinants of Health (SDOH) Interventions    Readmission Risk Interventions No flowsheet data found.

## 2018-12-07 NOTE — Progress Notes (Signed)
Physical Therapy Treatment Patient Details Name: Cassidy [name] MRN: [id number] DOB: [birth year] Today's Date: 12/07/2018    History of Present Illness 78 yo female s/p  TKR on 12/06/18. PMH includes melanoma, hypothyroidism, HTN, venous insufficiency, obesity, DVT and PE, L ankle fusion, L foot second ray amputation, R TKR 2010.    PT Comments    Pt assisted with using BSC (urinary urgency/incontinence) and then ambulated short distance in hallway.   Follow Up Recommendations  Follow surgeon's recommendation for DC plan and follow-up therapies;Supervision for mobility/OOB     Equipment Recommendations  Rolling walker with 5" wheels    Recommendations for Other Services       Precautions / Restrictions Precautions Precautions: Fall;Knee Restrictions Other Position/Activity Restrictions: WBAT    Mobility  Bed Mobility Overal bed mobility: Needs Assistance Bed Mobility: Supine to Sit;Sit to Supine     Supine to sit: Min assist Sit to supine: Min assist   General bed mobility comments: verbal cues for technique, assist for L LE  Transfers Overall transfer level: Needs assistance Equipment used: Rolling walker (2 wheeled) Transfers: Sit to/from [facility] Sit to Stand: Min assist Stand pivot transfers: Min assist       General transfer comment: verbal cues for UE and LE positioning  Ambulation/Gait Ambulation/Gait assistance: Min guard Gait Distance (Feet): 40 Feet Assistive device: Rolling walker (2 wheeled) Gait Pattern/deviations: Step-to pattern;Decreased stance time - left;Antalgic Gait velocity: decr   General Gait Details: Min guard for safety, verbal cuing for sequencing, RW positioning; pain and fatigue limiting distance   Stairs             Wheelchair Mobility    Modified Rankin (Stroke Patients Only)       Balance                                            Cognition Arousal/Alertness:  Awake/alert Behavior During Therapy: WFL for tasks assessed/performed Overall Cognitive Status: Within Functional Limits for tasks assessed                                 General Comments: very loquatious      Exercises      General Comments        Pertinent Vitals/Pain Pain Assessment: 0-10 Pain Score: 4  Pain Location: L knee Pain Descriptors / Indicators: Sore;Aching Pain Intervention(s): Limited activity within patient's tolerance;Monitored during session;Premedicated before session;Repositioned    Home Living                      Prior Function            PT Goals (current goals can now be found in the care plan section) Progress towards PT goals: Progressing toward goals    Frequency    7X/week      PT Plan Current plan remains appropriate    Co-evaluation              AM-PAC PT "6 Clicks" Mobility   Outcome Measure  Help needed turning from your back to your side while in a flat bed without using bedrails?: A Little Help needed moving from lying on your back to sitting on the side of a flat bed without using bedrails?: A Little Help needed moving to and from  a bed to a chair (including a wheelchair)?: A Little Help needed standing up from a chair using your arms (e.g., wheelchair or bedside chair)?: A Little Help needed to walk in hospital room?: A Little Help needed climbing 3-5 steps with a railing? : A Lot 6 Click Score: 17    End of Session Equipment Utilized During Treatment: Gait belt Activity Tolerance: Patient tolerated treatment well Patient left: in bed;with call bell/phone within reach;with bed alarm set Nurse Communication: Mobility status PT Visit Diagnosis: Other abnormalities of gait and mobility (R26.89);Difficulty in walking, not elsewhere classified (R26.2)     Time: 1749-4496 PT Time Calculation (min) (ACUTE ONLY): 17 min  Charges:  $Gait Training: 8-22 mins                    Carmelia Bake, PT,  DPT Acute Rehabilitation Services Office: [phone] Pager: [phone]   Trena Platt 12/07/2018, 5:06 PM

## 2018-12-07 NOTE — Progress Notes (Signed)
PATIENT ID: Cassidy [name]  MRN: [id number]  DOB/AGE:  1940-09-10 / 78 y.o.  1 Day Post-Op Procedure(s) (LRB): Left Knee Arthroplasty (Left)    PROGRESS NOTE Subjective: Patient is alert, oriented, no Nausea, no Vomiting, yes passing gas. Taking PO well. Denies SOB, Chest or Calf Pain. Using Incentive Spirometer, PAS in place. Ambulate 60', Patient reports pain as 2/10 .    Objective: Vital signs in last 24 hours: Vitals:   12/06/18 1656 12/06/18 2217 12/07/18 0149 12/07/18 0540  BP: 130/69 130/66 (Abnormal) 108/51 (Abnormal) 117/53  Pulse: 99 92 72 72  Resp: 16 16 16 16   Temp: 98 F (36.7 C) 97.9 F (36.6 C) 97.9 F (36.6 C) 98.2 F (36.8 C)  TempSrc: Oral Oral Oral Oral  SpO2: 96% 97% 98% 98%  Weight:      Height:          Intake/Output from previous day: I/O last 3 completed shifts: In: 47.6 [P.O.:1180; I.V.:3317.6; IV Piggyback:300] Out: 2050 [Urine:1900; Blood:150]   Intake/Output this shift: No intake/output data recorded.   LABORATORY DATA: Recent Labs    12/07/18 0304  WBC 7.9  HGB 11.1*  HCT 35.3*  PLT 184  NA 138  K 3.6  CL 107  CO2 25  BUN 14  CREATININE 0.77  GLUCOSE 163*  CALCIUM 8.2*    Examination: Neurologically intact ABD soft Neurovascular intact Sensation intact distally Intact pulses distally Dorsiflexion/Plantar flexion intact Incision: dressing C/D/I No cellulitis present Compartment soft}  Assessment:   1 Day Post-Op Procedure(s) (LRB): Left Knee Arthroplasty (Left) ADDITIONAL DIAGNOSIS: Expected Acute Blood Loss Anemia, Hypertension Anticipated LOS equal to or greater than 2 midnights due to - Age 78 and older with one or more of the following:  - Obesity  - Expected need for hospital services (PT, OT, Nursing) required for safe  discharge  - Anticipated need for postoperative skilled nursing care or inpatient rehab  - Morbid obesity, HTN,Venous insufficiency lower extremities     Plan: PT/OT WBAT, AROM and PROM   DVT Prophylaxis:  SCDx72hrs, ASA 81 mg BID x 2 weeks DISCHARGE PLAN: Home DISCHARGE NEEDS: HHPT, Walker and 3-in-1 comode seat     Kerin Salen 12/07/2018, 7:49 AM PATIENT ID: Cassidy [name]  MRN: [id number]  DOB/AGE:  1941-01-30 / 78 y.o.  1 Day Post-Op Procedure(s) (LRB): Left Knee Arthroplasty (Left)

## 2018-12-07 NOTE — Progress Notes (Signed)
    Home health agencies that serve [phone].        [place] Quality of Patient Care Rating Patient Survey Summary Rating  [facility] [phone] 3 out of 5 stars 4 out of [place] [phone] 3  out of 5 stars 4 out of [place] [phone]) [phone] 4  out of 5 stars 3 out of [place] [phone] 4  out of 5 stars 4 out of [place] [phone] 4 out of 5 stars 4 out of [place] [phone] 4 out of 5 stars 4 out of 5 stars  [facility] [place] [phone] 3  out of 5 stars 4 out of [place] [phone] 3 out of 5 stars 4 out of 5 stars  [facility] [phone] 3  out of 5 stars 3 out of [place] [phone] 3  out of 5 stars 4 out of [place] [phone] Not Available5 Not [place] [phone] 3  out of 5 stars 3 out of [place] [phone] 4  out of 5 stars 3 out of [place] number Footnote as displayed on [place]  1 This agency provides services under a federal waiver program to non-traditional, chronic long term population.  2 This agency provides services to a special needs population.  3 Not Available.  4 The number of patient episodes for this measure is too small to report.  5 This measure currently does not have data or provider has been certified/recertified for less than 6 months.  6 The national average for this measure is not provided because of state-to-state differences in data collection.  7 Medicare is not displaying rates for this measure for any home health agency, because of an issue with the data.  8 There were  problems with the data and they are being corrected.  9 Zero, or very few, patients met the survey's rules for inclusion. The scores shown, if any, reflect a very small number of surveys and may not accurately tell how an agency is doing.  10 Survey results are based on less than 12 months of data.  11 Fewer than 70 patients completed the survey. Use the scores shown, if any, with caution as the number of surveys may be too low to accurately tell how an agency is doing.  12 No survey results are available for this period.  13 Data suppressed by CMS for one or more quarters.

## 2018-12-08 LAB — CBC
HCT: 35 % — ABNORMAL LOW (ref 36.0–46.0)
Hemoglobin: 11 g/dL — ABNORMAL LOW (ref 12.0–15.0)
MCH: 29.9 pg (ref 26.0–34.0)
MCHC: 31.4 g/dL (ref 30.0–36.0)
MCV: 95.1 fL (ref 80.0–100.0)
Platelets: 169 10*3/uL (ref 150–400)
RBC: 3.68 MIL/uL — ABNORMAL LOW (ref 3.87–5.11)
RDW: 13.7 % (ref 11.5–15.5)
WBC: 10.6 10*3/uL — ABNORMAL HIGH (ref 4.0–10.5)
nRBC: 0 % (ref 0.0–0.2)

## 2018-12-08 NOTE — Progress Notes (Signed)
PATIENT ID: Cassidy [name]  MRN: [id number]  DOB/AGE:  78-25-1942 / 78 y.o.  2 Days Post-Op Procedure(s) (LRB): Left Knee Arthroplasty (Left)    PROGRESS NOTE Subjective: Patient is alert, oriented, no Nausea, no Vomiting, yes passing gas. Taking PO well. Denies SOB, Chest or Calf Pain. Using Incentive Spirometer, PAS in place. Ambulate WBAT with pt walking 40 ft, Patient reports pain as moderate .    Objective: Vital signs in last 24 hours: Vitals:   12/07/18 0936 12/07/18 1351 12/08/18 0004 12/08/18 0413  BP: (!) 98/54 (!) 143/76 128/69 (!) 141/75  Pulse: 73 95 93 88  Resp: 18 16 16 18   Temp: 98 F (36.7 C) 98.4 F (36.9 C) 98 F (36.7 C) 97.8 F (36.6 C)  TempSrc: Oral Oral  Oral  SpO2: 95% 94% 94% 94%  Weight:      Height:          Intake/Output from previous day: I/O last 3 completed shifts: In: 4450 [P.O.:2950; I.V.:1500] Out: 2150 [Urine:2150]   Intake/Output this shift: Total I/O In: -  Out: 400 [Urine:400]   LABORATORY DATA: Recent Labs    12/07/18 0304 12/08/18 0250  WBC 7.9 10.6*  HGB 11.1* 11.0*  HCT 35.3* 35.0*  PLT 184 169  NA 138  --   K 3.6  --   CL 107  --   CO2 25  --   BUN 14  --   CREATININE 0.77  --   GLUCOSE 163*  --   CALCIUM 8.2*  --     Examination: Neurologically intact Neurovascular intact Sensation intact distally Intact pulses distally Dorsiflexion/Plantar flexion intact Incision: dressing C/D/I and no drainage No cellulitis present Compartment soft}  Assessment:   2 Days Post-Op Procedure(s) (LRB): Left Knee Arthroplasty (Left) ADDITIONAL DIAGNOSIS: Expected Acute Blood Loss Anemia, Hypertension and hx of PE Anticipated LOS equal to or greater than 2 midnights due to - Age 16 and older with one or more of the following:  - Obesity  - Expected need for hospital services (PT, OT, Nursing) required for safe  discharge  - Anticipated need for postoperative skilled nursing care or inpatient rehab  OR   -  Unanticipated findings during/Post Surgery: Slow post-op progression: GI, pain control, mobility      Plan: PT/OT WBAT, AROM and PROM  DVT Prophylaxis:  SCDx72hrs, Eliquis DISCHARGE PLAN: Home DISCHARGE NEEDS: HHPT, Walker and 3-in-1 comode seat     Joanell Rising 12/08/2018, 8:33 AM

## 2018-12-08 NOTE — Discharge Summary (Signed)
Patient ID: Cassidy [name] MRN: [id number] DOB/AGE: 1940/12/31 78 y.o.  Admit date: 12/06/2018 Discharge date: 12/08/2018  Admission Diagnoses:  Principal Problem:   Degenerative arthritis of left knee Active Problems:   Total knee replacement status, left   Discharge Diagnoses:  Same  Past Medical History:  Diagnosis Date  . Anemia    with pregnancy  . Anxiety   . Arthritis   . Cancer ([place])    right foot,top of arm, lympth nodes removed  . Dependent edema   . DVT (deep venous thrombosis) ([place])   . Fatty liver   . Heart murmur    occ  . HTN (hypertension), benign   . Hyperlipidemia   . Hypothyroidism   . Lymphedema   . Melanoma ([place])   . Migraine 08/27/2010  . Osteopenia   . PE (pulmonary embolism) 2010   bilateral hx post op knee replacement  . PONV (postoperative nausea and vomiting)     Surgeries: Procedure(s): Left Knee Arthroplasty on 12/06/2018   Consultants:   Discharged Condition: Improved  Hospital Course: Cassidy [name] is an 78 y.o. female who was admitted 12/06/2018 for operative treatment ofDegenerative arthritis of left knee. Patient has severe unremitting pain that affects sleep, daily activities, and work/hobbies. After pre-op clearance the patient was taken to the operating room on 12/06/2018 and underwent  Procedure(s): Left Knee Arthroplasty.    Patient was given perioperative antibiotics:  Anti-infectives (From admission, onward)   Start     Dose/Rate Route Frequency Ordered Stop   12/06/18 2200  ciprofloxacin (CIPRO) tablet 500 mg  Status:  Discontinued     500 mg Oral 2 times daily 12/06/18 1331 12/07/18 1215   12/06/18 0806  ceFAZolin (ANCEF) 2-4 GM/100ML-% IVPB    Note to Pharmacy: Lavon Paganini   : cabinet override      12/06/18 0806 12/06/18 0946   12/06/18 0800  ceFAZolin (ANCEF) IVPB 2g/100 mL premix     2 g 200 mL/hr over 30 Minutes Intravenous On call to O.R. 12/06/18 0758 12/06/18 1016       Patient was given sequential  compression devices, early ambulation, and chemoprophylaxis to prevent DVT.  Patient benefited maximally from hospital stay and there were no complications.    Recent vital signs:  Patient Vitals for the past 24 hrs:  BP Temp Temp src Pulse Resp SpO2  12/08/18 0413 (!) 141/75 97.8 F (36.6 C) Oral 88 18 94 %  12/08/18 0004 128/69 98 F (36.7 C) - 93 16 94 %  12/07/18 1351 (!) 143/76 98.4 F (36.9 C) Oral 95 16 94 %  12/07/18 0936 (!) 98/54 98 F (36.7 C) Oral 73 18 95 %     Recent laboratory studies:  Recent Labs    12/07/18 0304 12/08/18 0250  WBC 7.9 10.6*  HGB 11.1* 11.0*  HCT 35.3* 35.0*  PLT 184 169  NA 138  --   K 3.6  --   CL 107  --   CO2 25  --   BUN 14  --   CREATININE 0.77  --   GLUCOSE 163*  --   CALCIUM 8.2*  --      Discharge Medications:   Allergies as of 12/08/2018   No Known Allergies     Medication List    STOP taking these medications   acetaminophen 650 MG CR tablet Commonly known as: TYLENOL   aspirin 81 MG tablet   ciprofloxacin 500 MG tablet Commonly known as: Cipro  ibuprofen 200 MG tablet Commonly known as: ADVIL     TAKE these medications   amLODipine 5 MG tablet Commonly known as: NORVASC TAKE ONE TABLET EACH DAY What changed:   how much to take  how to take this  when to take this  additional instructions   apixaban 2.5 MG Tabs tablet Commonly known as: Eliquis Take 1 tablet (2.5 mg total) by mouth 2 (two) times daily.   furosemide 40 MG tablet Commonly known as: LASIX TAKE ONE TABLET BY MOUTH ONCE DAILY What changed:   how much to take  how to take this  when to take this  additional instructions   loperamide 1 MG/5ML solution Commonly known as: IMODIUM Take 2 mg by mouth as needed for diarrhea or loose stools.   multivitamin with minerals Tabs tablet Take 1 tablet by mouth daily with lunch.   neomycin-bacitracin-polymyxin ointment Commonly known as: NEOSPORIN Apply 1 application topically as  needed for wound care.   oxyCODONE-acetaminophen 5-325 MG tablet Commonly known as: PERCOCET/ROXICET Take 1 tablet by mouth every 4 (four) hours as needed for severe pain.   Synthroid 100 MCG tablet Generic drug: levothyroxine TAKE ONE TABLET EVERY DAY What changed:   how much to take  how to take this  when to take this  additional instructions   tiZANidine 2 MG tablet Commonly known as: ZANAFLEX Take 1 tablet (2 mg total) by mouth every 6 (six) hours as needed.            Durable Medical Equipment  (From admission, onward)         Start     Ordered   12/06/18 1332  DME Walker rolling  Once    Question:  Patient needs a walker to treat with the following condition  Answer:  Status post total left knee replacement   12/06/18 1331   12/06/18 1332  DME 3 n 1  Once     12/06/18 1331           Discharge Care Instructions  (From admission, onward)         Start     Ordered   12/08/18 0000  Weight bearing as tolerated     12/08/18 0836          Diagnostic Studies: Dg Chest 2 View  Result Date: 12/01/2018 CLINICAL DATA:  Preop knee surgery.  Hypertension. EXAM: CHEST - 2 VIEW COMPARISON:  None. FINDINGS: The heart size is borderline enlarged. The thoracic aorta is somewhat tortuous. There is shift of the trachea to the right. There is no pneumothorax. No large pleural effusion. No focal infiltrate. No evidence for an acute osseous abnormality. IMPRESSION: 1. No acute cardiopulmonary process. 2. Borderline cardiomegaly. 3. Shift of the trachea to the right. This may be secondary to a large left hemi thyroid or other mass. Consider follow-up with ultrasound or CT as clinically indicated. Electronically Signed   By: Constance Holster M.D.   On: 12/01/2018 14:28   Ct Soft Tissue Neck W Contrast  Result Date: 12/03/2018 CLINICAL DATA:  Palpable nodule or thyroid enlargement. Tracheal shift to the right seen by radiography. History of melanoma. EXAM: CT NECK WITH  CONTRAST TECHNIQUE: Multidetector CT imaging of the neck was performed using the standard protocol following the bolus administration of intravenous contrast. CONTRAST:  69mL ISOVUE-300 IOPAMIDOL (ISOVUE-300) INJECTION 61% COMPARISON:  Chest radiography 12/01/2018 FINDINGS: Pharynx and larynx: No mucosal or submucosal lesion. Salivary glands: Parotid and submandibular glands are normal. Thyroid: Mild  nodularity of the right lobe of the thyroid which is not overall enlarged. Marked enlargement of the left lobe of the thyroid with a dominant partially necrotic mass measuring up to 6.5 cm in size. This displaces the trachea towards the right as seen by radiography. This most likely represents benign goiter, but based on the asymmetric nature, large size and complex nature, ultrasound would be suggested for further characterization based on TI-RADS evaluation. Lymph nodes: No enlarged or low-density nodes seen on either side of the neck. Vascular: Ordinary atherosclerotic change of the carotid bifurcations without evidence of flow limiting stenosis. Limited intracranial: Normal Visualized orbits: Limited.  Normal. Mastoids and visualized paranasal sinuses: Clear Skeleton: Ordinary cervical spondylosis and facet arthritis. Chronic fusion at C2-3. Upper chest: 5 mm nodule in the right upper lobe. No follow-up needed if patient is low-risk. Non-contrast chest CT can be considered in 12 months if patient is high-risk. This recommendation follows the consensus statement: Guidelines for Management of Incidental Pulmonary Nodules Detected on CT Images: From the Fleischner Society 2017; Radiology 2017; 284:228-243. Other: None IMPRESSION: Dominant 6.5 cm heterogeneous mass of the left lobe of the thyroid displacing the trachea towards the right as suggested by chest radiography. Thyroid ultrasound recommended for further evaluation and consideration of biopsy as discussed above. Electronically Signed   By: Nelson Chimes M.D.    On: 12/03/2018 14:21    Disposition: Discharge disposition: 01-Home or Self Care       Discharge Instructions    Call MD / Call 911   Complete by: As directed    If you experience chest pain or shortness of breath, CALL 911 and be transported to the hospital emergency room.  If you develope a fever above 101 F, pus (white drainage) or increased drainage or redness at the wound, or calf pain, call your surgeon's office.   Constipation Prevention   Complete by: As directed    Drink plenty of fluids.  Prune juice may be helpful.  You may use a stool softener, such as Colace (over the counter) 100 mg twice a day.  Use MiraLax (over the counter) for constipation as needed.   Diet - low sodium heart healthy   Complete by: As directed    Driving restrictions   Complete by: As directed    No driving for 2 weeks   Increase activity slowly as tolerated   Complete by: As directed    Patient may shower   Complete by: As directed    You may shower without a dressing once there is no drainage.  Do not wash over the wound.  If drainage remains, cover wound with plastic wrap and then shower.   Weight bearing as tolerated   Complete by: As directed       Follow-up Information    Frederik Pear, MD. Go on 12/21/2018.   Specialty: Orthopedic Surgery Why: Your appointment is scheduled for 10:30  Contact information: [place] [address] [phone]        [facility] up.   Specialty: [place] Why: You will have HHPT at you home for 5 visits prior to starting outpatient physical therapy  Contact information: [address] [place] [address] [phone]        [facility], [place]. Go on 12/21/2018.   Why: You are scheduled to start outpatient physical therapy at 12:00. Please arrive at 11:40 to complete you paperwork  Contact information: Physical Therapy [address]  [place] [address] [phone]             Signed: Joanell Rising 12/08/2018, 8:37 AM

## 2018-12-08 NOTE — Plan of Care (Signed)
Patient preparing for discharge. 

## 2018-12-08 NOTE — Progress Notes (Signed)
Physical Therapy Treatment Patient Details Name: Cassidy [name] MRN: [id number] DOB: 1941-02-07 Today's Date: 12/08/2018    History of Present Illness 78 yo female s/p  TKR on 12/06/18. PMH includes melanoma, hypothyroidism, HTN, venous insufficiency, obesity, DVT and PE, L ankle fusion, L foot second ray amputation, R TKR 2010.    PT Comments    POD # 2  General bed mobility comments: demonstarted and instructed how to use a belt loop to self assist LE.  General transfer comment: verbal cues for UE and LE positioning.  General Gait Details: Min guard for safety, verbal cuing for sequencing, RW positioning; tolerated an increased distance.  Then returned to room to perform some TE's following HEP handout.  Instructed on proper tech, freq as well as use of ICE.   Addressed all mobility questions, discussed appropriate activity, educated on use of ICE.  Pt ready for D/C to home.   Follow Up Recommendations  Follow surgeon's recommendation for DC plan and follow-up therapies;Supervision for mobility/OOB     Equipment Recommendations  Rolling walker with 5" wheels    Recommendations for Other Services       Precautions / Restrictions Precautions Precautions: Fall;Knee Restrictions Weight Bearing Restrictions: No Other Position/Activity Restrictions: WBAT    Mobility  Bed Mobility Overal bed mobility: Needs Assistance Bed Mobility: Supine to Sit     Supine to sit: Supervision;Min guard     General bed mobility comments: demonstarted and instructed how to use a belt loop to self assist LE  Transfers Overall transfer level: Needs assistance Equipment used: Rolling walker (2 wheeled) Transfers: Sit to/from [employer] Transfers Sit to Stand: Supervision;Min guard Stand pivot transfers: Supervision;Min guard       General transfer comment: verbal cues for UE and LE positioning  Ambulation/Gait Ambulation/Gait assistance: Min guard;Supervision Gait Distance  (Feet): 55 Feet Assistive device: Rolling walker (2 wheeled) Gait Pattern/deviations: Step-to pattern;Decreased stance time - left;Antalgic Gait velocity: decreased   General Gait Details: Min guard for safety, verbal cuing for sequencing, RW positioning; tolerated an increased distance   Stairs             Wheelchair Mobility    Modified Rankin (Stroke Patients Only)       Balance                                            Cognition Arousal/Alertness: Awake/alert Behavior During Therapy: WFL for tasks assessed/performed Overall Cognitive Status: Within Functional Limits for tasks assessed                                 General Comments: hyper talkative      Exercises      General Comments        Pertinent Vitals/Pain Pain Assessment: 0-10 Pain Score: 6  Pain Location: L knee Pain Descriptors / Indicators: Sore;Aching Pain Intervention(s): Monitored during session;Ice applied;Premedicated before session    Home Living                      Prior Function            PT Goals (current goals can now be found in the care plan section) Progress towards PT goals: Progressing toward goals    Frequency    7X/week  PT Plan Current plan remains appropriate    Co-evaluation              AM-PAC PT "6 Clicks" Mobility   Outcome Measure  Help needed turning from your back to your side while in a flat bed without using bedrails?: A Little Help needed moving from lying on your back to sitting on the side of a flat bed without using bedrails?: A Little Help needed moving to and from a bed to a chair (including a wheelchair)?: A Little Help needed standing up from a chair using your arms (e.g., wheelchair or bedside chair)?: A Little Help needed to walk in hospital room?: A Little Help needed climbing 3-5 steps with a railing? : A Little 6 Click Score: 18    End of Session Equipment Utilized During  Treatment: Gait belt Activity Tolerance: Patient tolerated treatment well Patient left: in chair Nurse Communication: Mobility status PT Visit Diagnosis: Other abnormalities of gait and mobility (R26.89);Difficulty in walking, not elsewhere classified (R26.2)     Time: 6295-2841 PT Time Calculation (min) (ACUTE ONLY): 42 min  Charges:  $Gait Training: 8-22 mins $Therapeutic Exercise: 8-22 mins $Therapeutic Activity: 8-22 mins                     Rica Koyanagi  PTA Acute  Rehabilitation Services Pager      [phone] Office      [phone]

## 2018-12-08 NOTE — TOC Transition Note (Signed)
Transition of Care [facility]) - CM/SW Discharge Note   Patient Details  Name: Cassidy [name] MRN: [id number] Date of Birth: 06-17-1940  Transition of Care [facility]) CM/SW Contact:  Leeroy Cha, RN Phone Number: 12/08/2018, 10:17 AM   Clinical Narrative:    dcd to home with home care through [facility] and equipment     Barriers to Discharge: Continued Medical Work up   Patient Goals and CMS Choice Patient states their goals for this hospitalization and ongoing recovery are:: to go home with therapy CMS Medicare.gov Compare Post Acute Care list provided to:: Patient Choice offered to / list presented to : Patient  Discharge Placement                       Discharge Plan and Services   Discharge Planning Services: CM Consult Post Acute Care Choice: Home Health          DME Arranged: Walker rolling DME Agency: [employer]       HH Arranged: PT [place] [facility] [employer] [facility] [employer]     Representative spoke with at [place]: pre arranged by MD office  Social Determinants of Health (SDOH) Interventions     Readmission Risk Interventions No flowsheet data found.

## 2018-12-09 DIAGNOSIS — E049 Nontoxic goiter, unspecified: Secondary | ICD-10-CM | POA: Diagnosis not present

## 2018-12-09 DIAGNOSIS — F419 Anxiety disorder, unspecified: Secondary | ICD-10-CM | POA: Diagnosis not present

## 2018-12-09 DIAGNOSIS — I890 Lymphedema, not elsewhere classified: Secondary | ICD-10-CM | POA: Diagnosis not present

## 2018-12-09 DIAGNOSIS — E034 Atrophy of thyroid (acquired): Secondary | ICD-10-CM | POA: Diagnosis not present

## 2018-12-09 DIAGNOSIS — Z7901 Long term (current) use of anticoagulants: Secondary | ICD-10-CM | POA: Diagnosis not present

## 2018-12-09 DIAGNOSIS — G43909 Migraine, unspecified, not intractable, without status migrainosus: Secondary | ICD-10-CM | POA: Diagnosis not present

## 2018-12-09 DIAGNOSIS — Z9181 History of falling: Secondary | ICD-10-CM | POA: Diagnosis not present

## 2018-12-09 DIAGNOSIS — I10 Essential (primary) hypertension: Secondary | ICD-10-CM | POA: Diagnosis not present

## 2018-12-09 DIAGNOSIS — Z96653 Presence of artificial knee joint, bilateral: Secondary | ICD-10-CM | POA: Diagnosis not present

## 2018-12-09 DIAGNOSIS — D630 Anemia in neoplastic disease: Secondary | ICD-10-CM | POA: Diagnosis not present

## 2018-12-09 DIAGNOSIS — Z86718 Personal history of other venous thrombosis and embolism: Secondary | ICD-10-CM | POA: Diagnosis not present

## 2018-12-09 DIAGNOSIS — I87303 Chronic venous hypertension (idiopathic) without complications of bilateral lower extremity: Secondary | ICD-10-CM | POA: Diagnosis not present

## 2018-12-09 DIAGNOSIS — C4370 Malignant melanoma of unspecified lower limb, including hip: Secondary | ICD-10-CM | POA: Diagnosis not present

## 2018-12-09 DIAGNOSIS — Z89412 Acquired absence of left great toe: Secondary | ICD-10-CM | POA: Diagnosis not present

## 2018-12-09 DIAGNOSIS — Z981 Arthrodesis status: Secondary | ICD-10-CM | POA: Diagnosis not present

## 2018-12-09 DIAGNOSIS — Z471 Aftercare following joint replacement surgery: Secondary | ICD-10-CM | POA: Diagnosis not present

## 2018-12-09 DIAGNOSIS — Z6836 Body mass index (BMI) 36.0-36.9, adult: Secondary | ICD-10-CM | POA: Diagnosis not present

## 2018-12-09 DIAGNOSIS — M8580 Other specified disorders of bone density and structure, unspecified site: Secondary | ICD-10-CM | POA: Diagnosis not present

## 2018-12-09 DIAGNOSIS — Z86711 Personal history of pulmonary embolism: Secondary | ICD-10-CM | POA: Diagnosis not present

## 2018-12-11 DIAGNOSIS — F419 Anxiety disorder, unspecified: Secondary | ICD-10-CM | POA: Diagnosis not present

## 2018-12-11 DIAGNOSIS — I87303 Chronic venous hypertension (idiopathic) without complications of bilateral lower extremity: Secondary | ICD-10-CM | POA: Diagnosis not present

## 2018-12-11 DIAGNOSIS — I890 Lymphedema, not elsewhere classified: Secondary | ICD-10-CM | POA: Diagnosis not present

## 2018-12-11 DIAGNOSIS — Z471 Aftercare following joint replacement surgery: Secondary | ICD-10-CM | POA: Diagnosis not present

## 2018-12-11 DIAGNOSIS — I10 Essential (primary) hypertension: Secondary | ICD-10-CM | POA: Diagnosis not present

## 2018-12-11 DIAGNOSIS — C4370 Malignant melanoma of unspecified lower limb, including hip: Secondary | ICD-10-CM | POA: Diagnosis not present

## 2018-12-13 ENCOUNTER — Other Ambulatory Visit: Payer: Self-pay

## 2018-12-13 NOTE — Patient Outreach (Signed)
[facility]) Care Management  12/13/2018  Saree Boye [facility] 1941/01/22 [id number]  EMMI: general discharge Referral date: 12/13/18 Referral reason: unfilled prescriptions Insurance: Medicare Day # 1 Outreach Attempt #1  Telephone call to patient regarding EMMI general discharge red alert. Unable to reach patient. HIPAA compliant voice message left with call back phone number.   PLAN;  RNCM will attempt 2nd telephone outreach within 4 business days  Quinn Plowman RN,BSN,CCM [facility] Telephonic  [phone]

## 2018-12-14 ENCOUNTER — Other Ambulatory Visit: Payer: Self-pay

## 2018-12-14 DIAGNOSIS — F419 Anxiety disorder, unspecified: Secondary | ICD-10-CM | POA: Diagnosis not present

## 2018-12-14 DIAGNOSIS — Z471 Aftercare following joint replacement surgery: Secondary | ICD-10-CM | POA: Diagnosis not present

## 2018-12-14 DIAGNOSIS — C4370 Malignant melanoma of unspecified lower limb, including hip: Secondary | ICD-10-CM | POA: Diagnosis not present

## 2018-12-14 DIAGNOSIS — I87303 Chronic venous hypertension (idiopathic) without complications of bilateral lower extremity: Secondary | ICD-10-CM | POA: Diagnosis not present

## 2018-12-14 DIAGNOSIS — I10 Essential (primary) hypertension: Secondary | ICD-10-CM | POA: Diagnosis not present

## 2018-12-14 DIAGNOSIS — I890 Lymphedema, not elsewhere classified: Secondary | ICD-10-CM | POA: Diagnosis not present

## 2018-12-14 NOTE — Patient Outreach (Signed)
[facility]) Care Management  12/14/2018  Layliana Brasseur [facility] 19-Jul-1940 [id number]  EMMI: general discharge Referral date: 12/13/18 Referral reason: unfilled prescriptions Insurance: Medicare Day # 1  Telephone call to patient regarding EMMI general discharge red alert.  HIPAA verified. RNCM explained reason for call.  Patient states she does not have any unfilled prescriptions. She reports having all of her medications and taking them as prescribed. Patient states she has been doing well since discharge. She reports having home physical therapy. Patient states she has a follow up appointment with her primary MD on 12/21/18.  Patient states her daughter is staying with her for a time so she has transportation to her doctor appointment.  Patient states her incision is healing well.  RNCM discussed signs/ symptoms of infection.  Advised patient to contact the surgeon's office for symptoms. Patient denies any further needs or concerns at this time.  RNCM offered to provide patient with 24 hour nurse advise line number.  Patient request information be mailed to her. RNCM confirmed patients mailing address.   PLAN; RNCM will close case due to patient being assessed and having no further needs.  RNCM will mail patient [facility] care management brochure magnet.  Quinn Plowman RN,BSN,CCM [facility] Telephonic  [phone]

## 2018-12-17 DIAGNOSIS — I10 Essential (primary) hypertension: Secondary | ICD-10-CM | POA: Diagnosis not present

## 2018-12-17 DIAGNOSIS — I890 Lymphedema, not elsewhere classified: Secondary | ICD-10-CM | POA: Diagnosis not present

## 2018-12-17 DIAGNOSIS — Z471 Aftercare following joint replacement surgery: Secondary | ICD-10-CM | POA: Diagnosis not present

## 2018-12-17 DIAGNOSIS — C4370 Malignant melanoma of unspecified lower limb, including hip: Secondary | ICD-10-CM | POA: Diagnosis not present

## 2018-12-17 DIAGNOSIS — F419 Anxiety disorder, unspecified: Secondary | ICD-10-CM | POA: Diagnosis not present

## 2018-12-17 DIAGNOSIS — I87303 Chronic venous hypertension (idiopathic) without complications of bilateral lower extremity: Secondary | ICD-10-CM | POA: Diagnosis not present

## 2018-12-20 DIAGNOSIS — I87303 Chronic venous hypertension (idiopathic) without complications of bilateral lower extremity: Secondary | ICD-10-CM | POA: Diagnosis not present

## 2018-12-20 DIAGNOSIS — Z471 Aftercare following joint replacement surgery: Secondary | ICD-10-CM | POA: Diagnosis not present

## 2018-12-20 DIAGNOSIS — F419 Anxiety disorder, unspecified: Secondary | ICD-10-CM | POA: Diagnosis not present

## 2018-12-20 DIAGNOSIS — I10 Essential (primary) hypertension: Secondary | ICD-10-CM | POA: Diagnosis not present

## 2018-12-20 DIAGNOSIS — I890 Lymphedema, not elsewhere classified: Secondary | ICD-10-CM | POA: Diagnosis not present

## 2018-12-20 DIAGNOSIS — C4370 Malignant melanoma of unspecified lower limb, including hip: Secondary | ICD-10-CM | POA: Diagnosis not present

## 2018-12-21 DIAGNOSIS — M1712 Unilateral primary osteoarthritis, left knee: Secondary | ICD-10-CM | POA: Diagnosis not present

## 2018-12-21 DIAGNOSIS — Z09 Encounter for follow-up examination after completed treatment for conditions other than malignant neoplasm: Secondary | ICD-10-CM | POA: Diagnosis not present

## 2018-12-21 DIAGNOSIS — M25662 Stiffness of left knee, not elsewhere classified: Secondary | ICD-10-CM | POA: Diagnosis not present

## 2018-12-21 DIAGNOSIS — Z96652 Presence of left artificial knee joint: Secondary | ICD-10-CM | POA: Diagnosis not present

## 2018-12-24 DIAGNOSIS — M25662 Stiffness of left knee, not elsewhere classified: Secondary | ICD-10-CM | POA: Diagnosis not present

## 2018-12-24 DIAGNOSIS — Z96652 Presence of left artificial knee joint: Secondary | ICD-10-CM | POA: Diagnosis not present

## 2019-01-03 ENCOUNTER — Ambulatory Visit: Payer: Medicare Other | Admitting: Podiatry

## 2019-01-03 DIAGNOSIS — M25662 Stiffness of left knee, not elsewhere classified: Secondary | ICD-10-CM | POA: Diagnosis not present

## 2019-01-03 DIAGNOSIS — Z96652 Presence of left artificial knee joint: Secondary | ICD-10-CM | POA: Diagnosis not present

## 2019-01-05 DIAGNOSIS — Z96652 Presence of left artificial knee joint: Secondary | ICD-10-CM | POA: Diagnosis not present

## 2019-01-05 DIAGNOSIS — M25662 Stiffness of left knee, not elsewhere classified: Secondary | ICD-10-CM | POA: Diagnosis not present

## 2019-01-10 DIAGNOSIS — Z96652 Presence of left artificial knee joint: Secondary | ICD-10-CM | POA: Diagnosis not present

## 2019-01-10 DIAGNOSIS — M25662 Stiffness of left knee, not elsewhere classified: Secondary | ICD-10-CM | POA: Diagnosis not present

## 2019-01-12 DIAGNOSIS — L821 Other seborrheic keratosis: Secondary | ICD-10-CM | POA: Diagnosis not present

## 2019-01-12 DIAGNOSIS — M25662 Stiffness of left knee, not elsewhere classified: Secondary | ICD-10-CM | POA: Diagnosis not present

## 2019-01-12 DIAGNOSIS — D225 Melanocytic nevi of trunk: Secondary | ICD-10-CM | POA: Diagnosis not present

## 2019-01-12 DIAGNOSIS — Z86018 Personal history of other benign neoplasm: Secondary | ICD-10-CM | POA: Diagnosis not present

## 2019-01-12 DIAGNOSIS — Z96652 Presence of left artificial knee joint: Secondary | ICD-10-CM | POA: Diagnosis not present

## 2019-01-12 DIAGNOSIS — Z23 Encounter for immunization: Secondary | ICD-10-CM | POA: Diagnosis not present

## 2019-01-12 DIAGNOSIS — D2271 Melanocytic nevi of right lower limb, including hip: Secondary | ICD-10-CM | POA: Diagnosis not present

## 2019-01-12 DIAGNOSIS — D485 Neoplasm of uncertain behavior of skin: Secondary | ICD-10-CM | POA: Diagnosis not present

## 2019-01-12 DIAGNOSIS — L570 Actinic keratosis: Secondary | ICD-10-CM | POA: Diagnosis not present

## 2019-01-12 DIAGNOSIS — B078 Other viral warts: Secondary | ICD-10-CM | POA: Diagnosis not present

## 2019-01-12 DIAGNOSIS — D1801 Hemangioma of skin and subcutaneous tissue: Secondary | ICD-10-CM | POA: Diagnosis not present

## 2019-01-12 DIAGNOSIS — Z85820 Personal history of malignant melanoma of skin: Secondary | ICD-10-CM | POA: Diagnosis not present

## 2019-01-12 DIAGNOSIS — L814 Other melanin hyperpigmentation: Secondary | ICD-10-CM | POA: Diagnosis not present

## 2019-01-17 DIAGNOSIS — Z96652 Presence of left artificial knee joint: Secondary | ICD-10-CM | POA: Diagnosis not present

## 2019-01-17 DIAGNOSIS — M25662 Stiffness of left knee, not elsewhere classified: Secondary | ICD-10-CM | POA: Diagnosis not present

## 2019-01-19 DIAGNOSIS — M25662 Stiffness of left knee, not elsewhere classified: Secondary | ICD-10-CM | POA: Diagnosis not present

## 2019-01-19 DIAGNOSIS — Z96652 Presence of left artificial knee joint: Secondary | ICD-10-CM | POA: Diagnosis not present

## 2019-02-07 ENCOUNTER — Other Ambulatory Visit: Payer: Medicare Other | Admitting: Internal Medicine

## 2019-02-14 ENCOUNTER — Ambulatory Visit: Payer: Medicare Other | Admitting: Podiatry

## 2019-02-14 ENCOUNTER — Encounter: Payer: Medicare Other | Admitting: Internal Medicine

## 2019-02-15 DIAGNOSIS — H25813 Combined forms of age-related cataract, bilateral: Secondary | ICD-10-CM | POA: Diagnosis not present

## 2019-02-15 DIAGNOSIS — H52203 Unspecified astigmatism, bilateral: Secondary | ICD-10-CM | POA: Diagnosis not present

## 2019-02-18 ENCOUNTER — Other Ambulatory Visit: Payer: Self-pay | Admitting: Internal Medicine

## 2019-02-21 ENCOUNTER — Ambulatory Visit (INDEPENDENT_AMBULATORY_CARE_PROVIDER_SITE_OTHER): Payer: Medicare Other | Admitting: Podiatry

## 2019-02-21 ENCOUNTER — Other Ambulatory Visit: Payer: Self-pay

## 2019-02-21 ENCOUNTER — Encounter: Payer: Self-pay | Admitting: Podiatry

## 2019-02-21 DIAGNOSIS — B351 Tinea unguium: Secondary | ICD-10-CM

## 2019-02-21 DIAGNOSIS — M79676 Pain in unspecified toe(s): Secondary | ICD-10-CM

## 2019-02-21 DIAGNOSIS — G629 Polyneuropathy, unspecified: Secondary | ICD-10-CM | POA: Diagnosis not present

## 2019-02-21 DIAGNOSIS — Q828 Other specified congenital malformations of skin: Secondary | ICD-10-CM

## 2019-02-21 NOTE — Progress Notes (Signed)
Patient ID: Cassidy [name], female   DOB: [birth year], 78 y.o.   MRN: [id number]  Subjective: Cassidy [name], 78 year old female presents the office today for concerns of thick painful toenails and calluses to both of her feet. She denies any redness or drainage from along the area.  No other complaints at this time in no acute changes since last appointment.   Objective: AAO 3, NAD DP/PT pulses are palpable, CRT less than 3 seconds  Protective sensation absent with Derrel Nip monofilament Hyperkeratotic lesion present right plantar hallux. Hyperkeratotic lesions were also present medial first MTPJ on the left and submetatarsal 5 bilaterally and left submetatarsal 2. Upon debridement was lesions no underlying ulceration, drainage, or signs of infection.  Nails are hypertrophic, dystrophic, brittle, discolored 9. There is tenderness palpation to the nails.  No pain with calf compression, swelling, warmth, erythema.  Assessment: 78 year old female with pre-ulcerative calluses bilaterally, symptomatic onychomycosis  Plan: -Treatment options discussed including all alternatives, risks, and complications -Nail sharply debrided 9 without complications/bleeding -Hyperkeratotic lesion sharply debrided 4 without complications -Discussed the importance of daily foot inspection -Follow-up 3 months or sooner if any problems arise. In the meantime, encouraged to call the office with any questions, concerns, change in symptoms.   Celesta Gentile, DPM

## 2019-03-10 ENCOUNTER — Other Ambulatory Visit: Payer: Self-pay

## 2019-03-14 ENCOUNTER — Other Ambulatory Visit: Payer: Self-pay

## 2019-03-14 ENCOUNTER — Ambulatory Visit: Payer: Medicare Other | Admitting: Internal Medicine

## 2019-03-14 ENCOUNTER — Encounter: Payer: Self-pay | Admitting: Internal Medicine

## 2019-03-14 ENCOUNTER — Telehealth: Payer: Self-pay | Admitting: Internal Medicine

## 2019-03-14 ENCOUNTER — Ambulatory Visit (INDEPENDENT_AMBULATORY_CARE_PROVIDER_SITE_OTHER): Payer: Medicare Other | Admitting: Internal Medicine

## 2019-03-14 VITALS — BP 137/80 | Temp 97.7°F | Ht 64.0 in | Wt 235.0 lb

## 2019-03-14 DIAGNOSIS — I10 Essential (primary) hypertension: Secondary | ICD-10-CM | POA: Diagnosis not present

## 2019-03-14 DIAGNOSIS — Z20828 Contact with and (suspected) exposure to other viral communicable diseases: Secondary | ICD-10-CM | POA: Diagnosis not present

## 2019-03-14 DIAGNOSIS — E119 Type 2 diabetes mellitus without complications: Secondary | ICD-10-CM | POA: Diagnosis not present

## 2019-03-14 DIAGNOSIS — Z20822 Contact with and (suspected) exposure to covid-19: Secondary | ICD-10-CM

## 2019-03-14 DIAGNOSIS — Z6841 Body Mass Index (BMI) 40.0 and over, adult: Secondary | ICD-10-CM | POA: Diagnosis not present

## 2019-03-14 NOTE — Patient Instructions (Signed)
Go to testing center at [facility] for COVID-19 test.  Remain quarantined at home.  Call if symptoms worsen.  Stay well-hydrated.  Tylenol as needed for fever or myalgias.

## 2019-03-14 NOTE — Telephone Encounter (Signed)
Schedule Virtual Visit? 

## 2019-03-14 NOTE — Telephone Encounter (Signed)
Cassidy [name] Waugh [id number]  Cassidy [name] called to say that on Sunday of last week she was around her daughter, that started having symptoms last Monday and was tested on Tuesday and she is positive for COVID. Cassidy [name] started on Friday night having Body aches, Chills, Stuffy Nose, Dull Headache. She does not have thermometer to take tempeture.

## 2019-03-14 NOTE — Progress Notes (Signed)
   Subjective:    Patient ID: Cassidy [name], female    DOB: Jan 24, 1941, 78 y.o.   MRN: [id number]  HPI 78 year old Female who had left [facility] in mid August.  She has recovered from that but unfortunately has had a close COVID-19 exposure with her daughter who has tested positive for COVID-19.  Also she is found to have her son and daughter-in-law also had tested positive for COVID-19 but have no symptoms.  Patient indicates that on Friday she developed a bad headache myalgias and chills.  She has been quarantined at home over the weekend.  She needs to have a COVID-19 test and we have directed her to testing center at [facility].  She has been taking Tylenol for fever.  Is staying well-hydrated.  She has a history of impaired glucose tolerance, hypertension, dependent edema, hyperlipidemia, osteopenia and hypothyroidism.  She is obese.  Resides alone.  Her Covid risk of complication score is 5.  Reports no shortness of breath.  Is able to monitor vital signs at home.  Does not feel that she has pneumonia.  Due to the Coronavirus pandemic, attempts were made to connect with patient by interactive audio and video telecommunications.  However due to technical difficulties, patient was not able to connect with [place] and we continued via telephone.  Review of Systems no nausea or vomiting.  Able to eat and drink.  No diarrhea.  Patient says symptoms get worse around 6 PM each night.     Objective:   Physical Exam Currently afebrile.  Her BMI is 40.34.  Blood pressure 137/80 reported by patient at home.  Her spirits are good.  No coughing is heard.  Does not sound hoarse.  Thought and judgment processes are normal.       Assessment & Plan:  Close exposure to COVID-19  Patient symptomatic and likely has COVID-19.  She was directed to testing center at [facility] for a test and is to remain quarantined for 7 to 10 days from November 20.  Rest and drink plenty of fluids.  Call if develop  significant cough or shortness of breath.  Recommend monitoring oxygen saturation if she has pulse oximeter.  15 minutes spent with patient and reviewing medical records

## 2019-03-14 NOTE — Progress Notes (Deleted)
Name: Cassidy [name]  MRN/ DOB: [id number], 1940-09-23    Age/ Sex: 78 y.o., female    PCP: Elby Showers, MD   Reason for Endocrinology Evaluation: Goiter     Date of Initial Endocrinology Evaluation: 03/14/2019     HPI: Cassidy [name] is a 78 y.o. female with a past medical history of Hx of PE, hypothyroidism  and anxiety. The patient presented for initial endocrinology clinic visit on 03/14/2019 for consultative assistance with her Goiter.   ***  HISTORY:  Past Medical History:  Past Medical History:  Diagnosis Date  . Anemia    with pregnancy  . Anxiety   . Arthritis   . Cancer ([place])    right foot,top of arm, lympth nodes removed  . Dependent edema   . DVT (deep venous thrombosis) ([place])   . Fatty liver   . Heart murmur    occ  . HTN (hypertension), benign   . Hyperlipidemia   . Hypothyroidism   . Lymphedema   . Melanoma ([place])   . Migraine 08/27/2010  . Osteopenia   . PE (pulmonary embolism) 2010   bilateral hx post op knee replacement  . PONV (postoperative nausea and vomiting)     Past Surgical History:  Past Surgical History:  Procedure Laterality Date  . ABDOMINAL HYSTERECTOMY     age 78  . AMPUTATION  11/10/2011   Procedure: AMPUTATION RAY;  Surgeon: Kerin Salen, MD;  Location: [facility];  Service: Orthopedics;  Laterality: Left;  LEFT 2ND TOE TRANS MIDDLE PHALANX AMPUTATION   . ANKLE FUSION  2005   lt-fusion  . APPENDECTOMY    . CHOLECYSTECTOMY  02/14/2002  . COLONOSCOPY    . GANGLION CYST EXCISION  2008   Rt wrist  . JOINT REPLACEMENT  2010   rt total knee  . KNEE ARTHROSCOPY  6/03   Rt knee  . OVARIAN CYST SURGERY    . RECTOCELE REPAIR  2009  . REDUCTION MAMMAPLASTY Bilateral   . TOTAL KNEE ARTHROPLASTY Left 12/06/2018   Procedure: Left Knee Arthroplasty;  Surgeon: Frederik Pear, MD;  Location[facility];  Service: Orthopedics;  Laterality: Left;  . UPPER GI ENDOSCOPY        Social History:  reports that she has  never smoked. She has never used smokeless tobacco. She reports that she does not drink alcohol or use drugs.  Family History: family history includes Breast cancer (age of onset: 40) in her daughter; Diabetes in her brother; Heart disease in her brother, father, and mother.   HOME MEDICATIONS: Allergies as of 03/14/2019   No Known Allergies     Medication List       Accurate as of March 14, 2019  7:13 AM. If you have any questions, ask your nurse or doctor.        amLODipine 5 MG tablet Commonly known as: NORVASC TAKE ONE TABLET DAILY   apixaban 2.5 MG Tabs tablet Commonly known as: Eliquis Take 1 tablet (2.5 mg total) by mouth 2 (two) times daily.   furosemide 40 MG tablet Commonly known as: LASIX TAKE ONE TABLET BY MOUTH ONCE DAILY What changed:   how much to take  how to take this  when to take this  additional instructions   loperamide 1 MG/5ML solution Commonly known as: IMODIUM Take 2 mg by mouth as needed for diarrhea or loose stools.   multivitamin with minerals Tabs tablet Take 1 tablet by mouth  daily with lunch.   neomycin-bacitracin-polymyxin ointment Commonly known as: NEOSPORIN Apply 1 application topically as needed for wound care.   oxyCODONE-acetaminophen 5-325 MG tablet Commonly known as: PERCOCET/ROXICET Take 1 tablet by mouth every 4 (four) hours as needed for severe pain.   Synthroid 100 MCG tablet Generic drug: levothyroxine TAKE ONE TABLET EVERY DAY What changed:   how much to take  how to take this  when to take this  additional instructions   tiZANidine 2 MG tablet Commonly known as: ZANAFLEX Take 1 tablet (2 mg total) by mouth every 6 (six) hours as needed.         REVIEW OF SYSTEMS: A comprehensive ROS was conducted with the patient and is negative except as per HPI and below:  ROS     OBJECTIVE:  VS: There were no vitals taken for this visit.   Wt Readings from Last 3 Encounters:  12/06/18 233 lb 3.2 oz  (105.8 kg)  12/01/18 233 lb 3.2 oz (105.8 kg)  11/25/18 235 lb (106.6 kg)     EXAM: General: Pt appears well and is in NAD  Hydration: Well-hydrated with moist mucous membranes and good skin turgor  Eyes: External eye exam normal without stare, lid lag or exophthalmos.  EOM intact.  PERRL.  Ears, Nose, Throat: Hearing: Grossly intact bilaterally Dental: Good dentition  Throat: Clear without mass, erythema or exudate  Neck: General: Supple without adenopathy. Thyroid: Thyroid size normal.  No goiter or nodules appreciated. No thyroid bruit.  Lungs: Clear with good BS bilat with no rales, rhonchi, or wheezes  Heart: Auscultation: RRR.  Abdomen: Normoactive bowel sounds, soft, nontender, without masses or organomegaly palpable  Extremities: Gait and station: Normal gait  Digits and nails: No clubbing, cyanosis, petechiae, or nodes Head and neck: Normal alignment and mobility BL UE: Normal ROM and strength. BL LE: No pretibial edema normal ROM and strength.  Skin: Hair: Texture and amount normal with gender appropriate distribution Skin Inspection: No rashes, acanthosis nigricans/skin tags. No lipohypertrophy Skin Palpation: Skin temperature, texture, and thickness normal to palpation  Neuro: Cranial nerves: II - XII grossly intact  Cerebellar: Normal coordination and movement; no tremor Motor: Normal strength throughout DTRs: 2+ and symmetric in UE without delay in relaxation phase  Mental Status: Judgment, insight: Intact Orientation: Oriented to time, place, and person Memory: Intact for recent and remote events Mood and affect: No depression, anxiety, or agitation     DATA REVIEWED: Results for [name], [name]" (MRN [id number]) as of 03/14/2019 07:13  Ref. Range 11/25/2018 12:00  TSH Latest Ref Range: 0.40 - 4.50 mIU/L 1.13      ASSESSMENT/PLAN/RECOMMENDATIONS:   1. Goiter:     Medications :  Signed electronically by: Mack Guise, MD  [facility]  Endocrinology  [facility] [address] [place], [place] [address] Phone: [phone] FAX: [phone]   CC: Elby Showers, MD [address] [id number] Phone: [phone] Fax: [phone]   Return to Endocrinology clinic as below: Future Appointments  Date Time Provider [place]  03/14/2019  7:30 AM Brittan Butterbaugh, Melanie Crazier, MD [facility] None  04/25/2019  8:45 AM Trula Slade, DPM [facility]  05/16/2019  2:00 PM Baxley, Cresenciano Lick, MD [facility]

## 2019-03-14 NOTE — Telephone Encounter (Signed)
Virtual visit 

## 2019-03-15 ENCOUNTER — Other Ambulatory Visit: Payer: Self-pay

## 2019-03-15 DIAGNOSIS — Z20828 Contact with and (suspected) exposure to other viral communicable diseases: Secondary | ICD-10-CM | POA: Diagnosis not present

## 2019-03-15 DIAGNOSIS — Z20822 Contact with and (suspected) exposure to covid-19: Secondary | ICD-10-CM

## 2019-03-17 LAB — NOVEL CORONAVIRUS, NAA: SARS-CoV-2, NAA: NOT DETECTED

## 2019-04-06 ENCOUNTER — Ambulatory Visit (INDEPENDENT_AMBULATORY_CARE_PROVIDER_SITE_OTHER): Payer: Medicare Other | Admitting: Internal Medicine

## 2019-04-06 ENCOUNTER — Other Ambulatory Visit: Payer: Self-pay

## 2019-04-06 ENCOUNTER — Encounter: Payer: Self-pay | Admitting: Internal Medicine

## 2019-04-06 VITALS — BP 118/84 | HR 78 | Temp 98.7°F | Ht 64.0 in | Wt 227.0 lb

## 2019-04-06 DIAGNOSIS — E041 Nontoxic single thyroid nodule: Secondary | ICD-10-CM

## 2019-04-06 NOTE — Patient Instructions (Signed)
-   We have ordered a thyroid ultrasound for you, if you so not hear about it in 2 weeks please call them at [phone]

## 2019-04-06 NOTE — Progress Notes (Signed)
Name: Cassidy [name]  MRN/ DOB: [id number], [birth year]    Age/ Sex: 78 y.o., female    PCP: Elby Showers, MD   Reason for Endocrinology Evaluation: Goiter     Date of Initial Endocrinology Evaluation: 04/07/2019     HPI: [name] is a 78 y.o. female with a past medical history of HTN, Hx of DVT's and melanoma. The patient presented for initial endocrinology clinic visit on 04/07/2019 for consultative assistance with her Goiter.   Pt was noted to have a tracheal deviation on CXR during pre-op evaluation. A CT scan showed a left thyroid necrotic mass that measures ~ 6.5 cm.   She did not notice any local neck enlargement, dysphagia, no sob when recumbent.   No prior exposure to radiation   She has been on LT- 4 replacement for years. She takes hers in the middle of the time.   Weight has been decreasing due  stopping coke .  Has alternating normal BM's with diarrhea.  No chest pain or palpitations  Denies depression and anxiety.    HISTORY:  Past Medical History:  Past Medical History:  Diagnosis Date  . Anemia    with pregnancy  . Anxiety   . Arthritis   . Cancer ([place])    right foot,top of arm, lympth nodes removed  . Dependent edema   . DVT (deep venous thrombosis) ([place])   . Fatty liver   . Heart murmur    occ  . HTN (hypertension), benign   . Hyperlipidemia   . Hypothyroidism   . Lymphedema   . Melanoma ([place])   . Migraine 08/27/2010  . Osteopenia   . PE (pulmonary embolism) 2010   bilateral hx post op knee replacement  . PONV (postoperative nausea and vomiting)    Past Surgical History:  Past Surgical History:  Procedure Laterality Date  . ABDOMINAL HYSTERECTOMY     age 12  . AMPUTATION  11/10/2011   Procedure: AMPUTATION RAY;  Surgeon: Kerin Salen, MD;  Location: [facility];  Service: Orthopedics;  Laterality: Left;  LEFT 2ND TOE TRANS MIDDLE PHALANX AMPUTATION   . ANKLE FUSION  2005   lt-fusion  . APPENDECTOMY    .  CHOLECYSTECTOMY  02/14/2002  . COLONOSCOPY    . GANGLION CYST EXCISION  2008   Rt wrist  . JOINT REPLACEMENT  2010   rt total knee  . KNEE ARTHROSCOPY  6/03   Rt knee  . OVARIAN CYST SURGERY    . RECTOCELE REPAIR  2009  . REDUCTION MAMMAPLASTY Bilateral   . TOTAL KNEE ARTHROPLASTY Left 12/06/2018   Procedure: Left Knee Arthroplasty;  Surgeon: Frederik Pear, MD;  [facility];  Service: Orthopedics;  Laterality: Left;  . UPPER GI ENDOSCOPY        Social History:  reports that she has never smoked. She has never used smokeless tobacco. She reports that she does not drink alcohol or use drugs.  Family History: family history includes Breast cancer (age of onset: 31) in her daughter; Diabetes in her brother; Heart disease in her brother, father, and mother.   HOME MEDICATIONS: Allergies as of 04/06/2019   No Known Allergies     Medication List       Accurate as of April 06, 2019 11:59 PM. If you have any questions, ask your nurse or doctor.        amLODipine 5 MG tablet Commonly known as: NORVASC TAKE ONE TABLET  DAILY   apixaban 2.5 MG Tabs tablet Commonly known as: Eliquis Take 1 tablet (2.5 mg total) by mouth 2 (two) times daily.   furosemide 40 MG tablet Commonly known as: LASIX TAKE ONE TABLET BY MOUTH ONCE DAILY What changed:   how much to take  how to take this  when to take this  additional instructions   loperamide 1 MG/5ML solution Commonly known as: IMODIUM Take 2 mg by mouth as needed for diarrhea or loose stools.   multivitamin with minerals Tabs tablet Take 1 tablet by mouth daily with lunch.   neomycin-bacitracin-polymyxin ointment Commonly known as: NEOSPORIN Apply 1 application topically as needed for wound care.   oxyCODONE-acetaminophen 5-325 MG tablet Commonly known as: PERCOCET/ROXICET Take 1 tablet by mouth every 4 (four) hours as needed for severe pain.   Synthroid 100 MCG tablet Generic drug: levothyroxine TAKE ONE TABLET  EVERY DAY What changed:   how much to take  how to take this  when to take this  additional instructions   tiZANidine 2 MG tablet Commonly known as: ZANAFLEX Take 1 tablet (2 mg total) by mouth every 6 (six) hours as needed.         REVIEW OF SYSTEMS: A comprehensive ROS was conducted with the patient and is negative except as per HPI and below:  ROS     OBJECTIVE:  VS: BP 118/84 (BP Location: Left Arm, Patient Position: Sitting, Cuff Size: Large)   Pulse 78   Temp 98.7 F (37.1 C)   Ht 5\' 4"  (1.626 m)   Wt 227 lb (103 kg)   SpO2 98%   BMI 38.96 kg/m    Wt Readings from Last 3 Encounters:  04/06/19 227 lb (103 kg)  03/14/19 235 lb (106.6 kg)  12/06/18 233 lb 3.2 oz (105.8 kg)     EXAM: General: Pt appears well and is in NAD  Hydration: Well-hydrated with moist mucous membranes and good skin turgor  Eyes: External eye exam normal without stare, lid lag or exophthalmos.  EOM intact.    Neck: General: Supple without adenopathy. Thyroid: Thyroid size normal.  Left nodule appreciated.  Lungs: Clear with good BS bilat with no rales, rhonchi, or wheezes  Heart: Auscultation: RRR.  Abdomen: Normoactive bowel sounds, soft, nontender, without masses or organomegaly palpable  Extremities:  BL LE: No pretibial edema normal ROM and strength.  Skin: Hair: Texture and amount normal with gender appropriate distribution Skin Inspection: No rashes Skin Palpation: Skin temperature, texture, and thickness normal to palpation  Neuro: Cranial nerves: II - XII grossly intact  Motor: Normal strength throughout DTRs: 2+ and symmetric in UE without delay in relaxation phase  Mental Status: Judgment, insight: Intact Orientation: Oriented to time, place, and person Mood and affect: No depression, anxiety, or agitation     DATA REVIEWED: Results for DANYETTA, RIZZUTI" (MRN [id number]) as of 04/07/2019 10:36  Ref. Range 11/25/2018 12:00  TSH Latest Ref Range: 0.40 - 4.50  mIU/L 1.13    CXR 12/01/2018 IMPRESSION: 1. No acute cardiopulmonary process. 2. Borderline cardiomegaly. 3. Shift of the trachea to the right. This may be secondary to a large left hemi thyroid or other mass. Consider follow-up with ultrasound or CT as clinically indicated.   CT neck (12/03/2018)  Thyroid: Mild nodularity of the right lobe of the thyroid which is not overall enlarged. Marked enlargement of the left lobe of the thyroid with a dominant partially necrotic mass measuring up to 6.5 cm in size.  This displaces the trachea towards the right as seen by radiography. This most likely represents benign goiter, but based on the asymmetric nature, large size and complex nature, ultrasound would be suggested for further characterization based on TI-RADS evaluation.  Lymph nodes: No enlarged or low-density nodes seen on either side of the neck.  ASSESSMENT/PLAN/RECOMMENDATIONS:   1. Left thyroid nodule :   - Pt is clinically and biochemically euthyroid  - No local neck symptoms  - We reviewed her CT scan results, we discussed that ultrasound is a better modality to evaluate the thyroid and will proceed with this followed by FNA of the left nodule, but will wait on the ultrasound first in case more nodules need to be biopsied  F/U in 6 months   Signed electronically by: Mack Guise, MD  [facility]  [facility] [address] [place], [place] [address] Phone: [phone] FAX: [phone]   CC: Elby Showers, MD [address] [place] [id number] Phone: [phone] Fax: [phone]   Return to Endocrinology clinic as below: Future Appointments  Date Time Provider [place]  04/20/2019  3:15 PM [facility] [place] [facility]  04/25/2019  8:45 AM Trula Slade, DPM [facility]  05/16/2019  2:00 PM Baxley, Cresenciano Lick, MD [facility]  10/04/2019  7:30 AM Dreama Kuna, Melanie Crazier, MD  [facility] None

## 2019-04-07 ENCOUNTER — Encounter: Payer: Self-pay | Admitting: Internal Medicine

## 2019-04-20 ENCOUNTER — Other Ambulatory Visit: Payer: Medicare Other

## 2019-04-25 ENCOUNTER — Ambulatory Visit (INDEPENDENT_AMBULATORY_CARE_PROVIDER_SITE_OTHER): Payer: Medicare Other | Admitting: Podiatry

## 2019-04-25 ENCOUNTER — Other Ambulatory Visit: Payer: Self-pay

## 2019-04-25 DIAGNOSIS — B351 Tinea unguium: Secondary | ICD-10-CM | POA: Diagnosis not present

## 2019-04-25 DIAGNOSIS — M79676 Pain in unspecified toe(s): Secondary | ICD-10-CM | POA: Diagnosis not present

## 2019-04-25 DIAGNOSIS — G629 Polyneuropathy, unspecified: Secondary | ICD-10-CM

## 2019-04-25 DIAGNOSIS — Q828 Other specified congenital malformations of skin: Secondary | ICD-10-CM | POA: Diagnosis not present

## 2019-04-26 ENCOUNTER — Ambulatory Visit
Admission: RE | Admit: 2019-04-26 | Discharge: 2019-04-26 | Disposition: A | Discharge status: Home / Self Care | Payer: Medicare Other | Source: Ambulatory Visit | Attending: Internal Medicine | Admitting: Internal Medicine

## 2019-04-26 DIAGNOSIS — E041 Nontoxic single thyroid nodule: Secondary | ICD-10-CM

## 2019-04-26 NOTE — Progress Notes (Signed)
Patient ID: Cassidy [name], female   DOB: [birth year], 79 y.o.   MRN: [id number]  Subjective: Cassidy [name], 79 year old female presents the office today for concerns of thick painful toenails and calluses to both of her feet. She denies any redness or drainage from along the area.  No other complaints at this time in no acute changes since last appointment.   Objective: AAO 3, NAD DP/PT pulses are palpable, CRT less than 3 seconds  Protective sensation absent with Derrel Nip monofilament Hyperkeratotic lesion present right plantar hallux. Hyperkeratotic lesions were also present medial first MTPJ on the left and submetatarsal 5 bilaterally and left submetatarsal 2. Upon debridement was lesions no underlying ulceration, drainage, or signs of infection.  Nails are hypertrophic, dystrophic, brittle, discolored 9. There is tenderness palpation to the nails.  No pain with calf compression, swelling, warmth, erythema.  Assessment: 79 year old female with pre-ulcerative calluses bilaterally, symptomatic onychomycosis  Plan: -Treatment options discussed including all alternatives, risks, and complications -Nail sharply debrided 9 without complications/bleeding -Hyperkeratotic lesion sharply debrided 4 without complications -Discussed the importance of daily foot inspection -Follow-up 3 months or sooner if any problems arise. In the meantime, encouraged to call the office with any questions, concerns, change in symptoms.   Celesta Gentile, DPM

## 2019-04-27 ENCOUNTER — Telehealth: Payer: Self-pay | Admitting: Internal Medicine

## 2019-04-27 DIAGNOSIS — E041 Nontoxic single thyroid nodule: Secondary | ICD-10-CM

## 2019-04-27 NOTE — Telephone Encounter (Signed)
Discussed ultrasound results . Pt agreed to proceed with FNA of the left middle and inferior nodules     Abby Nena Jordan, MD  [facility]  [facility] [address] [place], [place] [address] Phone: [phone] FAX: [phone]

## 2019-04-28 DIAGNOSIS — H5703 Miosis: Secondary | ICD-10-CM | POA: Diagnosis not present

## 2019-04-28 DIAGNOSIS — H2512 Age-related nuclear cataract, left eye: Secondary | ICD-10-CM | POA: Diagnosis not present

## 2019-05-04 ENCOUNTER — Other Ambulatory Visit (HOSPITAL_COMMUNITY)
Admission: RE | Admit: 2019-05-04 | Discharge: 2019-05-04 | Disposition: A | Discharge status: Home / Self Care | Payer: Medicare Other | Source: Ambulatory Visit | Attending: Radiology | Admitting: Radiology

## 2019-05-04 ENCOUNTER — Ambulatory Visit
Admission: RE | Admit: 2019-05-04 | Discharge: 2019-05-04 | Disposition: A | Discharge status: Home / Self Care | Payer: Medicare Other | Source: Ambulatory Visit | Attending: Internal Medicine | Admitting: Internal Medicine

## 2019-05-04 DIAGNOSIS — E041 Nontoxic single thyroid nodule: Secondary | ICD-10-CM

## 2019-05-04 DIAGNOSIS — D440 Neoplasm of uncertain behavior of thyroid gland: Secondary | ICD-10-CM | POA: Diagnosis not present

## 2019-05-04 DIAGNOSIS — E042 Nontoxic multinodular goiter: Secondary | ICD-10-CM | POA: Diagnosis not present

## 2019-05-06 ENCOUNTER — Telehealth: Payer: Self-pay | Admitting: Internal Medicine

## 2019-05-06 DIAGNOSIS — D497 Neoplasm of unspecified behavior of endocrine glands and other parts of nervous system: Secondary | ICD-10-CM

## 2019-05-06 DIAGNOSIS — D440 Neoplasm of uncertain behavior of thyroid gland: Secondary | ICD-10-CM | POA: Insufficient documentation

## 2019-05-06 LAB — CYTOLOGY - NON PAP

## 2019-05-06 NOTE — Telephone Encounter (Signed)
    Clinical History: Left; Mid 2.6 cm; Other 2 dimensions: 1.8 x 1.8 cm  solid almost completely solid, hypoechoic TI-RADS total points - 4  Specimen Submitted: A. 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 I have recommended total thyroidectomy to the patient.  Afirma is pending    Pt in agreement to proceed with sx.    Abby Nena Jordan, MD  [facility] End[facility]  [facility] [address] [place], [place] [address] Phone: [phone] FAX: [phone]

## 2019-05-16 ENCOUNTER — Ambulatory Visit (INDEPENDENT_AMBULATORY_CARE_PROVIDER_SITE_OTHER): Payer: Medicare Other | Admitting: Internal Medicine

## 2019-05-16 ENCOUNTER — Other Ambulatory Visit: Payer: Self-pay

## 2019-05-16 ENCOUNTER — Encounter: Payer: Self-pay | Admitting: Internal Medicine

## 2019-05-16 VITALS — BP 110/80 | HR 98 | Temp 98.0°F | Ht 64.0 in | Wt 218.0 lb

## 2019-05-16 DIAGNOSIS — E8881 Metabolic syndrome: Secondary | ICD-10-CM | POA: Diagnosis not present

## 2019-05-16 DIAGNOSIS — I10 Essential (primary) hypertension: Secondary | ICD-10-CM

## 2019-05-16 DIAGNOSIS — Z0000 Encounter for general adult medical examination without abnormal findings: Secondary | ICD-10-CM | POA: Diagnosis not present

## 2019-05-16 DIAGNOSIS — Z86711 Personal history of pulmonary embolism: Secondary | ICD-10-CM

## 2019-05-16 DIAGNOSIS — R609 Edema, unspecified: Secondary | ICD-10-CM

## 2019-05-16 DIAGNOSIS — E119 Type 2 diabetes mellitus without complications: Secondary | ICD-10-CM | POA: Diagnosis not present

## 2019-05-16 DIAGNOSIS — E039 Hypothyroidism, unspecified: Secondary | ICD-10-CM

## 2019-05-16 DIAGNOSIS — E0789 Other specified disorders of thyroid: Secondary | ICD-10-CM | POA: Diagnosis not present

## 2019-05-16 DIAGNOSIS — Z6837 Body mass index (BMI) 37.0-37.9, adult: Secondary | ICD-10-CM | POA: Diagnosis not present

## 2019-05-16 DIAGNOSIS — Z85820 Personal history of malignant melanoma of skin: Secondary | ICD-10-CM | POA: Diagnosis not present

## 2019-05-16 DIAGNOSIS — F411 Generalized anxiety disorder: Secondary | ICD-10-CM

## 2019-05-16 DIAGNOSIS — E049 Nontoxic goiter, unspecified: Secondary | ICD-10-CM | POA: Diagnosis not present

## 2019-05-16 NOTE — Progress Notes (Signed)
Subjective:    Patient ID: Cassidy [name], female    DOB: 1941-02-02, 79 y.o.   MRN: [id number]  HPI 79 year old Female for Medicare wellness, health maintenance exam and evalaution of medical issues.  She has hypothyroidism, hypertension, osteopenia, hyperlipidemia, dependent edema, impaired glucose tolerance and obesity.  In 2015 she had melanoma right back requiring wide excision.    In 2016 she had melanoma excised from her right foot.  Also at that time she had dysplastic nevus of the right shoulder for which she underwent a wide excision.  However, in March 2016 she developed redness and swelling as well as drainage from previously healed wounds of right foot and ankle.  She was placed on antibiotics and DVT was ruled out.  She was seen at [facility] diagnosed with delayed surgical wound healing and was treated there at the wound care center for several months.  Just had cataract extraction left eye by Dr. Carolyn Stare.  She just had thyroid biopsy. 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  Pathology is being reviewed. Seeing Endocrinologist, Dr. Kelton Pillar.  May need to have thyroidectomy.  Had labs in August except lipid panel.   Tested negative for Covid-19 in November but had flu-like illness.  Left knee arthroplasty August 2020.  Right knee arthroplasty July 2010 and subsequently developed bilateral pulmonary emboli after her arthroplasty.  Arthroscopic surgery for torn ligaments right knee June 2003.  Right ganglion cyst surgery 2008.  Left ankle surgery 2005 involving bone graft.  Arthroscopic surgery for torn ligaments right knee 2003.   Ovarian cystectomy 1968, vaginal hysterectomy 1982 for fibroids and bleeding.  Rectocele repair 2009.  Cholecystectomy 2003. for which she underwent wide excision.  A Cardiolite study done at [facility] April 2009 was normal.  Social history: Divorced.  Does not smoke.  Social alcohol consumption.   Lives alone.  2 adult children, a son and a daughter.  Daughter with history of breast cancer doing well .    3 granddaughters in good health.  Family history: Father died at age 51 of a sudden MI.  Mother died at age 35 of heart problems.  1 brother deceased in an airplane crash at age 5.  2 sisters in good health.                                                                                                         Review of Systems  Respiratory: Negative.   Cardiovascular: Negative.   Gastrointestinal: Negative.   Genitourinary: Negative.   Psychiatric/Behavioral: Negative.  Objective:   Physical Exam  Blood pressure 110/80 pulse 98 pulse oximetry 99% weight 218 pounds height 5 feet 4 inches BMI 37.42  Skin warm and dry.  Nodes none.  Neck is supple.  No carotid bruits.  Symmetrical mild thyromegaly.  Chest clear.  Cardiac exam regular rate and rhythm normal S1 and S2.  Abdomen obese soft nondistended without hepatosplenomegaly masses or tenderness.  Pelvic exam  deferred.      Assessment & Plan:  BMI 37.42-not really able to exercise due to orthopedic issues.  Gets around slowly.  Needs to watch diet.  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  This was discovered on recent CT scan after preoperative chest x-ray showed shift of trachea to the right.  CT was recommended and she was found to have mild nodularity right lobe of thyroid and marked enlargement of the left lobe of thyroid with partially necrotic mass measuring up to 6.5 cm in size displacing trachea towards the right.  Was subsequently referred to Dr. Abel Presto for.  Essential hypertension-stable  Hypothyroidism-stable on thyroid replacement  Osteopenia  Hyperlipidemia-LDL elevated at 125, has low HDL of 44 total cholesterol of 190 and triglycerides of 100  Dependent edema  Impaired glucose tolerance-hemoglobin A1c in August 2020 was normal  History of melanoma  Plan: Continue current medications and follow-up in 6 months.  Endocrinology evaluation ongoing regarding abnormality right lobe of thyroid.  Subjective:   Patient presents for Medicare Annual/Subsequent preventive examination.  Review Past Medical/Family/Social: See above   Risk Factors  Current exercise habits: Mostly sedentary with very little exercise Dietary issues discussed: Low-fat low carbohydrate  Cardiac risk factors: Family.  Both mother and father.  Hyperlipidemia.  Obesity. Depression Screen  (Note: if answer to either of the following is "Yes", a more complete depression screening is indicated)   Over the past two weeks, have you felt down, depressed or hopeless? No  Over the past two weeks, have you felt little interest or pleasure in doing things? No Have you lost interest or pleasure in daily life? No Do you often feel hopeless? No Do you cry easily over simple problems? No   Activities of Daily Living  In your present state of health, do you have any  difficulty performing the following activities?:   Driving? No  Managing money? No  Feeding yourself? No  Getting from bed to chair? No  Climbing a flight of stairs? No  Preparing food and eating?: No  Bathing or showering? No  Getting dressed: No  Getting to the toilet? No  Using the toilet:No  Moving around from place to place: No  In the past year have you fallen or had a near fall?:No  Are you sexually active? No  Do you have more than one partner? No   Hearing Difficulties: No  Do you often ask people to speak up or repeat themselves? No  Do you experience ringing or noises in your ears? No  Do you have difficulty understanding soft or whispered voices? No  Do you feel that you have a problem with memory? No Do you often misplace items? No    Home Safety:  Do you have a smoke alarm at your residence? Yes Do you have grab bars in the bathroom? Do you have throw rugs in your house?   Cognitive Testing  Alert? Yes Normal Appearance?Yes  Oriented to person? Yes Place? Yes  Time? Yes  Recall of three objects? Yes  Can perform simple  calculations? Yes  Displays appropriate judgment?Yes  Can read the correct time from a watch face?Yes   List the Names of Other Physician/Practitioners you currently use:  See referral list for the physicians patient is currently seeing.     Review of Systems: See above   Objective:     General appearance: Appears stated age and  obese  Head: Normocephalic, without obvious abnormality, atraumatic  Eyes: conj clear, EOMi PEERLA  Ears: normal TM's and external ear canals both ears  Nose: Nares normal. Septum midline. Mucosa normal. No drainage or sinus tenderness.  Throat: lips, mucosa, and tongue normal; teeth and gums normal  Neck: no adenopathy, no carotid bruit, no JVD No CVA tenderness.  Lungs: clear to auscultation bilaterally  Breasts: normal appearance, no masses or tenderness Heart: regular rate and rhythm, S1, S2  normal, no murmur, click, rub or gallop  Abdomen: soft, non-tender; bowel sounds normal; no masses, no organomegaly  Musculoskeletal: ROM normal in all joints, no crepitus, no deformity, Normal muscle strengthen. Back  is symmetric, no curvature. Skin: Skin color, texture, turgor normal. No rashes or lesions  Lymph nodes: Cervical, supraclavicular, and axillary nodes normal.  Neurologic: CN 2 -12 Normal, Normal symmetric reflexes. Normal coordination and gait  Psych: Alert & Oriented x 3, Mood appear stable.    Assessment:    Annual wellness medicare exam   Plan:    During the course of the visit the patient was educated and counseled about appropriate screening and preventive services including:   Take Covid vaccine when available     Patient Instructions (the written plan) was given to the patient.  Medicare Attestation  I have personally reviewed:  The patient's medical and social history  Their use of alcohol, tobacco or illicit drugs  Their current medications and supplements  The patient's functional ability including ADLs,fall risks, home safety risks, cognitive, and hearing and visual impairment  Diet and physical activities  Evidence for depression or mood disorders  The patient's weight, height, BMI, and visual acuity have been recorded in the chart. I have made referrals, counseling, and provided education to the patient based on review of the above and I have provided the patient with a written personalized care plan for preventive services.

## 2019-05-23 ENCOUNTER — Other Ambulatory Visit: Payer: Medicare Other | Admitting: Internal Medicine

## 2019-05-24 ENCOUNTER — Other Ambulatory Visit: Payer: Medicare Other | Admitting: Internal Medicine

## 2019-05-30 ENCOUNTER — Other Ambulatory Visit: Payer: Medicare Other | Admitting: Internal Medicine

## 2019-05-30 ENCOUNTER — Other Ambulatory Visit: Payer: Self-pay

## 2019-05-30 DIAGNOSIS — R7302 Impaired glucose tolerance (oral): Secondary | ICD-10-CM | POA: Diagnosis not present

## 2019-05-30 DIAGNOSIS — E781 Pure hyperglyceridemia: Secondary | ICD-10-CM | POA: Diagnosis not present

## 2019-05-30 DIAGNOSIS — Z20822 Contact with and (suspected) exposure to covid-19: Secondary | ICD-10-CM | POA: Diagnosis not present

## 2019-05-31 ENCOUNTER — Ambulatory Visit: Payer: Self-pay | Admitting: Surgery

## 2019-05-31 DIAGNOSIS — D440 Neoplasm of uncertain behavior of thyroid gland: Secondary | ICD-10-CM | POA: Diagnosis not present

## 2019-05-31 DIAGNOSIS — E039 Hypothyroidism, unspecified: Secondary | ICD-10-CM | POA: Diagnosis not present

## 2019-05-31 DIAGNOSIS — E042 Nontoxic multinodular goiter: Secondary | ICD-10-CM | POA: Diagnosis not present

## 2019-05-31 DIAGNOSIS — Z96652 Presence of left artificial knee joint: Secondary | ICD-10-CM | POA: Diagnosis not present

## 2019-05-31 LAB — SAR COV2 SEROLOGY (COVID19)AB(IGG),IA: SARS CoV2 AB IGG: NEGATIVE

## 2019-05-31 LAB — LIPID PANEL
Cholesterol: 190 mg/dL (ref <200)
HDL: 44 mg/dL — ABNORMAL LOW (ref >=50)
LDL Cholesterol (Calc): 125 mg/dL (calc) — ABNORMAL HIGH
Non-HDL Cholesterol (Calc): 146 mg/dL (calc) — ABNORMAL HIGH (ref <130)
Total CHOL/HDL Ratio: 4.3 (calc) (ref <5.0)
Triglycerides: 100 mg/dL (ref <150)

## 2019-06-08 ENCOUNTER — Encounter (HOSPITAL_COMMUNITY): Payer: Self-pay

## 2019-06-08 NOTE — Patient Instructions (Addendum)
DUE TO COVID-19 ONLY ONE VISITOR IS ALLOWED TO COME WITH YOU AND STAY IN THE WAITING ROOM ONLY DURING PRE OP AND PROCEDURE DAY OF SURGERY. THE 1 VISITOR MAY VISIT WITH YOU AFTER SURGERY IN YOUR PRIVATE ROOM DURING VISITING HOURS ONLY!  YOU NEED TO HAVE A COVID 19 TEST ON: 06/14/19 @ 10:20, THIS TEST MUST BE DONE BEFORE SURGERY, COME  [place] , [address].  ([place]) ONCE YOUR COVID TEST IS COMPLETED, PLEASE BEGIN THE QUARANTINE INSTRUCTIONS AS OUTLINED IN YOUR HANDOUT.                [place] IS SCHEDULE ON 06/17/19   Report to [facility] Main  Entrance     Report to admitting at: 11:30 AM     Call this number if you have problems the morning of surgery [phone]    Remember:    [place], NO CHEWING GUM [place].     Take these medicines the morning of surgery with A SIP OF WATER: AMLODIPINE,SYNTHROID.                                 You may not have any metal on your body including hair pins and              piercings  Do not wear jewelry, make-up, lotions, powders or perfumes, deodorant             Do not wear nail polish on your fingernails.  Do not shave  48 hours prior to surgery.                 Do not bring valuables to the hospital. [place].  Contacts, dentures or bridgework may not be worn into surgery.  Leave suitcase in the car. After surgery it may be brought to your room.     Patients discharged the day of surgery will not be allowed to drive home. IF YOU ARE HAVING SURGERY AND GOING HOME THE SAME DAY, YOU MUST HAVE AN ADULT TO DRIVE YOU HOME AND BE WITH YOU FOR 24 HOURS. YOU MAY GO HOME BY TAXI OR UBER OR ORTHERWISE, BUT AN ADULT MUST ACCOMPANY YOU HOME AND STAY WITH YOU FOR 24 HOURS.  Name and phone number of your driver:  Special Instructions: N/A              Please read over  the following fact sheets you were given: _____________________________________________________________________             NO SOLID FOOD AFTER MIDNIGHT THE NIGHT PRIOR TO SURGERY. NOTHING BY MOUTH EXCEPT CLEAR LIQUIDS FROM MIDNIGHT  UNTIL : 10:30 AM.    CLEAR LIQUID DIET   Foods Allowed                                                                     Foods Excluded  Coffee and tea, regular and decaf  liquids that you cannot  Plain Jell-O any favor except red or purple                                           see through such as: Fruit ices (not with fruit pulp)                                     milk, soups, orange juice  Iced Popsicles                                    All solid food Carbonated beverages, regular and diet                                    Cranberry, grape and apple juices Sports drinks like Gatorade Lightly seasoned clear broth or consume(fat free) Sugar, honey syrup  Sample Menu Breakfast                                Lunch                                     Supper Cranberry juice                    Beef broth                            Chicken broth Jell-O                                     Grape juice                           Apple juice Coffee or tea                        Jell-O                                      Popsicle                                                Coffee or tea                        Coffee or tea  _____________________________________________________________________  [facility] - Preparing for Surgery Before surgery, you can play an important role.  Because skin is not sterile, your skin needs to be as free of germs as possible.  You can reduce the number of germs on your skin by washing with CHG (chlorahexidine gluconate) soap before surgery.  CHG is an antiseptic cleaner which kills  germs and bonds with the skin to continue killing germs even after washing. Please DO NOT use if you have an  allergy to CHG or antibacterial soaps.  If your skin becomes reddened/irritated stop using the CHG and inform your nurse when you arrive at Short Stay. Do not shave (including legs and underarms) for at least 48 hours prior to the first CHG shower.  You may shave your face/neck. Please follow these instructions carefully:  1.  Shower with CHG Soap the night before surgery and the  morning of Surgery.  2.  If you choose to wash your hair, wash your hair first as usual with your  normal  shampoo.  3.  After you shampoo, rinse your hair and body thoroughly to remove the  shampoo.                           4.  Use CHG as you would any other liquid soap.  You can apply chg directly  to the skin and wash                       Gently with a scrungie or clean washcloth.  5.  Apply the CHG Soap to your body ONLY FROM THE NECK DOWN.   Do not use on face/ open                           Wound or open sores. Avoid contact with eyes, ears mouth and genitals (private parts).                       Wash face,  Genitals (private parts) with your normal soap.             6.  Wash thoroughly, paying special attention to the area where your surgery  will be performed.  7.  Thoroughly rinse your body with warm water from the neck down.  8.  DO NOT shower/wash with your normal soap after using and rinsing off  the CHG Soap.                9.  Pat yourself dry with a clean towel.            10.  Wear clean pajamas.            11.  Place clean sheets on your bed the night of your first shower and do not  sleep with pets. Day of Surgery : Do not apply any lotions/deodorants the morning of surgery.  Please wear clean clothes to the hospital/surgery center.  FAILURE TO FOLLOW THESE INSTRUCTIONS MAY RESULT IN THE CANCELLATION OF YOUR SURGERY PATIENT SIGNATURE_________________________________  NURSE SIGNATURE__________________________________  ________________________________________________________________________

## 2019-06-09 ENCOUNTER — Encounter (HOSPITAL_COMMUNITY): Payer: Medicare Other

## 2019-06-09 ENCOUNTER — Encounter (HOSPITAL_COMMUNITY): Payer: Self-pay

## 2019-06-09 ENCOUNTER — Encounter (HOSPITAL_COMMUNITY)
Admission: RE | Admit: 2019-06-09 | Discharge: 2019-06-09 | Disposition: A | Discharge status: Home / Self Care | Payer: Medicare Other | Source: Ambulatory Visit | Attending: Surgery | Admitting: Surgery

## 2019-06-09 ENCOUNTER — Other Ambulatory Visit: Payer: Self-pay

## 2019-06-09 DIAGNOSIS — Z01818 Encounter for other preprocedural examination: Secondary | ICD-10-CM | POA: Diagnosis not present

## 2019-06-09 NOTE — Progress Notes (Signed)
PCP - DR. MARY BAXLEY. LOV: 05/16/19 Cardiologist -   Chest x-ray -  EKG - 11/25/18 EPIC Stress Test -  ECHO -  Cardiac Cath -   Sleep Study -  CPAP -   Fasting Blood Sugar -  Checks Blood Sugar _____ times a day  Blood Thinner Instructions: Aspirin Instructions: will stop on 06/12/19. As per MD. Instructions. Last Dose:  Anesthesia review:   Patient denies shortness of breath, fever, cough and chest pain at PAT appointment   Patient verbalized understanding of instructions that were given to them at the PAT appointment. Patient was also instructed that they will need to review over the PAT instructions again at home before surgery.

## 2019-06-10 ENCOUNTER — Encounter (HOSPITAL_COMMUNITY)
Admission: RE | Admit: 2019-06-10 | Discharge: 2019-06-10 | Disposition: A | Discharge status: Home / Self Care | Payer: Medicare Other | Source: Ambulatory Visit | Attending: Surgery | Admitting: Surgery

## 2019-06-10 DIAGNOSIS — Z01818 Encounter for other preprocedural examination: Secondary | ICD-10-CM | POA: Diagnosis not present

## 2019-06-10 LAB — CBC
HCT: 43.5 % (ref 36.0–46.0)
Hemoglobin: 13.5 g/dL (ref 12.0–15.0)
MCH: 28.5 pg (ref 26.0–34.0)
MCHC: 31 g/dL (ref 30.0–36.0)
MCV: 92 fL (ref 80.0–100.0)
Platelets: 258 10*3/uL (ref 150–400)
RBC: 4.73 MIL/uL (ref 3.87–5.11)
RDW: 15.6 % — ABNORMAL HIGH (ref 11.5–15.5)
WBC: 6.9 10*3/uL (ref 4.0–10.5)
nRBC: 0 % (ref 0.0–0.2)

## 2019-06-10 LAB — BASIC METABOLIC PANEL
Anion gap: 11 (ref 5–15)
BUN: 16 mg/dL (ref 8–23)
CO2: 26 mmol/L (ref 22–32)
Calcium: 9.8 mg/dL (ref 8.9–10.3)
Chloride: 105 mmol/L (ref 98–111)
Creatinine, Ser: 0.86 mg/dL (ref 0.44–1.00)
GFR calc Af Amer: >60 mL/min (ref >60)
GFR calc non Af Amer: >60 mL/min (ref >60)
Glucose, Bld: 118 mg/dL — ABNORMAL HIGH (ref 70–99)
Potassium: 4.7 mmol/L (ref 3.5–5.1)
Sodium: 142 mmol/L (ref 135–145)

## 2019-06-14 ENCOUNTER — Other Ambulatory Visit (HOSPITAL_COMMUNITY)
Admission: RE | Admit: 2019-06-14 | Discharge: 2019-06-14 | Disposition: A | Discharge status: Home / Self Care | Payer: Medicare Other | Source: Ambulatory Visit | Attending: Surgery | Admitting: Surgery

## 2019-06-14 DIAGNOSIS — Z20822 Contact with and (suspected) exposure to covid-19: Secondary | ICD-10-CM | POA: Insufficient documentation

## 2019-06-14 DIAGNOSIS — Z01812 Encounter for preprocedural laboratory examination: Secondary | ICD-10-CM | POA: Insufficient documentation

## 2019-06-14 LAB — SARS CORONAVIRUS 2 (TAT 6-24 HRS): SARS Coronavirus 2: NEGATIVE

## 2019-06-15 ENCOUNTER — Encounter (HOSPITAL_COMMUNITY): Payer: Self-pay | Admitting: Surgery

## 2019-06-15 DIAGNOSIS — E042 Nontoxic multinodular goiter: Secondary | ICD-10-CM | POA: Diagnosis present

## 2019-06-15 NOTE — H&P (Signed)
[facility]  [place] R Nero DOB: 10-May-1940 Divorced / Language: English / Race: White Female   History of Present Illness   The patient is a 79 year old female who presents with a complaint of Enlarged thyroid.  CHIEF COMPLAINT: multiple thyroid nodules, cytologic atypia  The patient is referred by Dr. Vivia Ewing for surgical evaluation and management of multiple thyroid nodules with cytologic atypia. Patient's primary care physician is Dr. Tommie Ard Baxley. Patient's orthopedic surgeon is Dr. Frederik Pear. Patient was noted on preoperative chest x-ray prior to her knee surgery last summer to have tracheal deviation. Subsequent evaluation demonstrated multiple thyroid nodules including a study on CT scan in August 2020. Today we reviewed her ultrasound report from April 26, 2019. This shows an enlarged thyroid gland with the left lobe measuring 6.7 x 4.7 x 6.3 cm. Right thyroid lobe is borderline enlarged at 5.4 cm. There are bilateral multiple thyroid nodules. Patient underwent fine-needle aspiration biopsy on May 04, 2019. 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 Another nodule on the left side was biopsied with Bethesda category III findings. Both of these biopsies were submitted for molecular genetic testing. These results are still pending. Patient presents on referral from her endocrinologist to discuss total thyroidectomy. Patient has some mild to moderate compressive symptoms including episodes of dyspnea particularly when lying recumbent and she has a frequent globus sensation. Patient states that she has had known thyroid nodules for many years. She has been on thyroid hormone supplementation for hypothyroidism for many years. She currently takes levothyroxine 100 g daily. She has had no prior head or neck surgery. There is no family history of  thyroid disease or thyroid malignancy. Patient does have a distant history of pulmonary embolism and it previously been on anticoagulation. She does still take aspirin. Patient has lymphedema and uses compression devices on her lower extremities. Patient presents today to discuss total thyroidectomy for management of multinodular thyroid goiter with cytologic atypia and mild to moderate compressive symptoms.   Past Surgical History  Appendectomy  Cataract Surgery  Bilateral. Foot Surgery  Left. Knee Surgery  Bilateral. Mammoplasty; Reduction  Left. Oral Surgery   Diagnostic Studies History  Mammogram  within last year  Allergies No Known Drug Allergies [05/31/2019]:  Medication History amLODIPine Besylate (5MG  Tablet, Oral) Active. Eliquis (2.5MG  Tablet, Oral) Active. Moxifloxacin HCl (0.5% Solution, Ophthalmic) Active. Imodium (2MG  Capsule, Oral) Active. Multivitamin (Oral) Active. Neosporin Original (External) Active. Medications Reconciled  Social History Caffeine use  Coffee, Tea. No alcohol use  No drug use  Tobacco use  Never smoker.  Family History  Breast Cancer  Daughter, Father. Heart Disease  Father. Heart disease in female family member before age 27  Hypertension  Father.  Pregnancy / Birth History  Age of menopause  75-55 Gravida  2 Maternal age  15-25 Para  2  Other Problems  General anesthesia - complications  High blood pressure  Melanoma  Thyroid Disease   Review of Systems Skin Present- Dryness. Not Present- Change in Wart/Mole, Hives, Jaundice, New Lesions, Non-Healing Wounds, Rash and Ulcer. HEENT Present- Wears glasses/contact lenses. Not Present- Earache, Hearing Loss, Hoarseness, Nose Bleed, Oral Ulcers, Ringing in the Ears, Seasonal Allergies, Sinus Pain, Sore Throat, Visual Disturbances and Yellow Eyes. Respiratory Not Present- Bloody sputum, Chronic Cough, Difficulty Breathing, Snoring and  Wheezing. Breast Not Present- Breast Mass, Breast Pain, Nipple Discharge and Skin Changes. Cardiovascular  Not Present- Chest Pain, Difficulty Breathing Lying Down, Leg Cramps, Palpitations, Rapid Heart Rate, Shortness of Breath and Swelling of Extremities. Gastrointestinal Not Present- Abdominal Pain, Bloating, Bloody Stool, Change in Bowel Habits, Chronic diarrhea, Constipation, Difficulty Swallowing, Excessive gas, Gets full quickly at meals, Hemorrhoids, Indigestion, Nausea, Rectal Pain and Vomiting. Female Genitourinary Not Present- Frequency, Nocturia, Painful Urination, Pelvic Pain and Urgency. Neurological Not Present- Decreased Memory, Fainting, Headaches, Numbness, Seizures, Tingling, Tremor, Trouble walking and Weakness. Psychiatric Not Present- Anxiety, Bipolar, Change in Sleep Pattern, Depression, Fearful and Frequent crying. Endocrine Not Present- Cold Intolerance, Excessive Hunger, Hair Changes, Heat Intolerance, Hot flashes and New Diabetes. Hematology Not Present- Blood Thinners, Easy Bruising, Excessive bleeding, Gland problems, HIV and Persistent Infections.  Vitals  Weight: 218.13 lb Height: 64in Body Surface Area: 2.03 m Body Mass Index: 37.44 kg/m  Temp.: 98.33F  Pulse: 122 (Regular)  P.OX: 98% (Room air) BP: 158/98 (Sitting, Left Arm, Standard)  Physical Exam   GENERAL APPEARANCE Development: normal Nutritional status: normal Gross deformities: none  SKIN Rash, lesions, ulcers: none Induration, erythema: none Nodules: none palpable  EYES Conjunctiva and lids: normal Pupils: equal and reactive Iris: normal bilaterally  EARS, NOSE, MOUTH, THROAT External ears: no lesion or deformity External nose: no lesion or deformity Hearing: grossly normal Patient is wearing a mask.  NECK Symmetric: yes Trachea: slight shift to right Thyroid: There is enlargement of the thyroid gland which is somewhat difficult to appreciate on physical examination as  the gland extends into the upper mediastinum. Left lobe is larger than the right. Both lobes are relatively firm. There is no tenderness. There is no associated lymphadenopathy.  CHEST Respiratory effort: normal Retraction or accessory muscle use: no Breath sounds: normal bilaterally Rales, rhonchi, wheeze: none  CARDIOVASCULAR Auscultation: regular rhythm, normal rate Murmurs: none Pulses: carotid and radial pulse 2+ palpable Lower extremity edema: marked, right greater than left  MUSCULOSKELETAL Station and gait: normal Digits and nails: no clubbing or cyanosis Muscle strength: grossly normal all extremities Range of motion: grossly normal all extremities Deformity: none  LYMPHATIC Cervical: none palpable Supraclavicular: none palpable  PSYCHIATRIC Oriented to person, place, and time: yes Mood and affect: normal for situation Judgment and insight: appropriate for situation    Assessment & Plan  NEOPLASM OF UNCERTAIN BEHAVIOR OF THYROID GLAND (D44.0)  Patient is referred by her endocrinologist, Dr. Vivia Ewing, for surgical evaluation for total thyroidectomy for management of multiple thyroid nodules, thyroid neoplasms of uncertain behavior, and mild to moderate compressive symptoms. Patient does have tracheal deviation.  Patient provided with a copy of "The Thyroid Book: Medical and Surgical Treatment of Thyroid Problems", published by [employer], 16 pages. Book reviewed and explained to patient during visit today.  Patient has an enlarged thyroid gland. 2 nodules on the left side were biopsied and demonstrated cytologic atypia. Molecular genetic testing is pending. Patient is interested in proceeding with total thyroidectomy. We discussed the procedure today in detail. We discussed the location of the surgical incision. We discussed the risk and benefits of the surgery including the risk of injury to recurrent laryngeal nerves and the parathyroid glands. We  discussed the hospital stay to be anticipated in the postoperative recovery. We discussed the need for lifelong thyroid hormone replacement. The patient understands and wishes to proceed with surgery in the near future.  The risks and benefits of the procedure have been discussed at length with the patient. The patient understands the proposed procedure, potential alternative treatments, and the course of recovery to  be expected. All of the patient's questions have been answered at this time. The patient wishes to proceed with surgery.  Armandina Gemma, MD [facility] Office: [phone]

## 2019-06-17 ENCOUNTER — Ambulatory Visit (HOSPITAL_COMMUNITY): Payer: Medicare Other | Admitting: Anesthesiology

## 2019-06-17 ENCOUNTER — Ambulatory Visit (HOSPITAL_COMMUNITY): Payer: Medicare Other | Admitting: Physician Assistant

## 2019-06-17 ENCOUNTER — Encounter (HOSPITAL_COMMUNITY)
Admission: RE | Disposition: A | Discharge status: Home / Self Care | Payer: Self-pay | Source: Ambulatory Visit | Attending: Surgery

## 2019-06-17 ENCOUNTER — Other Ambulatory Visit: Payer: Self-pay

## 2019-06-17 ENCOUNTER — Ambulatory Visit (HOSPITAL_COMMUNITY): Payer: Medicare Other

## 2019-06-17 ENCOUNTER — Encounter (HOSPITAL_COMMUNITY): Payer: Self-pay | Admitting: Surgery

## 2019-06-17 ENCOUNTER — Observation Stay (HOSPITAL_COMMUNITY)
Admission: RE | Admit: 2019-06-17 | Discharge: 2019-06-18 | Disposition: A | Discharge status: Home / Self Care | Payer: Medicare Other | Source: Ambulatory Visit | Attending: Surgery | Admitting: Surgery

## 2019-06-17 DIAGNOSIS — Z01818 Encounter for other preprocedural examination: Secondary | ICD-10-CM

## 2019-06-17 DIAGNOSIS — Z7901 Long term (current) use of anticoagulants: Secondary | ICD-10-CM | POA: Diagnosis not present

## 2019-06-17 DIAGNOSIS — I10 Essential (primary) hypertension: Secondary | ICD-10-CM | POA: Diagnosis not present

## 2019-06-17 DIAGNOSIS — D440 Neoplasm of uncertain behavior of thyroid gland: Secondary | ICD-10-CM | POA: Diagnosis not present

## 2019-06-17 DIAGNOSIS — Z86711 Personal history of pulmonary embolism: Secondary | ICD-10-CM | POA: Insufficient documentation

## 2019-06-17 DIAGNOSIS — Z86718 Personal history of other venous thrombosis and embolism: Secondary | ICD-10-CM | POA: Insufficient documentation

## 2019-06-17 DIAGNOSIS — E042 Nontoxic multinodular goiter: Secondary | ICD-10-CM | POA: Diagnosis not present

## 2019-06-17 DIAGNOSIS — E049 Nontoxic goiter, unspecified: Secondary | ICD-10-CM | POA: Diagnosis not present

## 2019-06-17 DIAGNOSIS — Z79890 Hormone replacement therapy (postmenopausal): Secondary | ICD-10-CM | POA: Insufficient documentation

## 2019-06-17 DIAGNOSIS — C73 Malignant neoplasm of thyroid gland: Principal | ICD-10-CM | POA: Insufficient documentation

## 2019-06-17 DIAGNOSIS — E039 Hypothyroidism, unspecified: Secondary | ICD-10-CM | POA: Diagnosis not present

## 2019-06-17 DIAGNOSIS — Z7982 Long term (current) use of aspirin: Secondary | ICD-10-CM | POA: Diagnosis not present

## 2019-06-17 HISTORY — PX: THYROIDECTOMY: SHX17

## 2019-06-17 SURGERY — THYROIDECTOMY
Anesthesia: General

## 2019-06-17 MED ORDER — DEXAMETHASONE SODIUM PHOSPHATE 10 MG/ML IJ SOLN
INTRAMUSCULAR | Status: DC | PRN
  Administered 2019-06-17: 8 mg via INTRAVENOUS

## 2019-06-17 MED ORDER — ROCURONIUM BROMIDE 10 MG/ML (PF) SYRINGE
PREFILLED_SYRINGE | INTRAVENOUS | Status: AC
  Filled 2019-06-17: qty 10

## 2019-06-17 MED ORDER — FUROSEMIDE 40 MG PO TABS
40.0000 mg | ORAL_TABLET | Freq: Every day | ORAL | Status: DC
  Administered 2019-06-18: 40 mg via ORAL
  Filled 2019-06-17: qty 1

## 2019-06-17 MED ORDER — PROPOFOL 10 MG/ML IV BOLUS
INTRAVENOUS | Status: AC
  Filled 2019-06-17: qty 20

## 2019-06-17 MED ORDER — CEFAZOLIN SODIUM-DEXTROSE 2-4 GM/100ML-% IV SOLN
2.0000 g | INTRAVENOUS | Status: AC
  Administered 2019-06-17: 14:00:00 2 g via INTRAVENOUS
  Filled 2019-06-17: qty 100

## 2019-06-17 MED ORDER — CHLORHEXIDINE GLUCONATE CLOTH 2 % EX PADS: 6.0000 | MEDICATED_PAD | Freq: Once | CUTANEOUS | Status: DC

## 2019-06-17 MED ORDER — OXYCODONE HCL 5 MG/5ML PO SOLN: 5.0000 mg | Freq: Once | ORAL | Status: DC | PRN

## 2019-06-17 MED ORDER — FENTANYL CITRATE (PF) 100 MCG/2ML IJ SOLN
INTRAMUSCULAR | Status: AC
  Filled 2019-06-17: qty 4

## 2019-06-17 MED ORDER — HYDROMORPHONE HCL 1 MG/ML IJ SOLN
1.0000 mg | INTRAMUSCULAR | Status: DC | PRN
  Administered 2019-06-17: 1 mg via INTRAVENOUS
  Filled 2019-06-17: qty 1

## 2019-06-17 MED ORDER — EPHEDRINE SULFATE 50 MG/ML IJ SOLN
INTRAMUSCULAR | Status: DC | PRN
  Administered 2019-06-17: 10 mg via INTRAVENOUS

## 2019-06-17 MED ORDER — ROCURONIUM BROMIDE 100 MG/10ML IV SOLN
INTRAVENOUS | Status: DC | PRN
  Administered 2019-06-17: 50 mg via INTRAVENOUS
  Administered 2019-06-17 (×2): 20 mg via INTRAVENOUS

## 2019-06-17 MED ORDER — OXYCODONE HCL 5 MG PO TABS: 5.0000 mg | ORAL_TABLET | ORAL | Status: DC | PRN

## 2019-06-17 MED ORDER — ONDANSETRON 4 MG PO TBDP: 4.0000 mg | ORAL_TABLET | Freq: Four times a day (QID) | ORAL | Status: DC | PRN

## 2019-06-17 MED ORDER — ONDANSETRON HCL 4 MG/2ML IJ SOLN
INTRAMUSCULAR | Status: DC | PRN
  Administered 2019-06-17: 4 mg via INTRAVENOUS

## 2019-06-17 MED ORDER — DEXAMETHASONE SODIUM PHOSPHATE 10 MG/ML IJ SOLN
INTRAMUSCULAR | Status: AC
  Filled 2019-06-17: qty 1

## 2019-06-17 MED ORDER — PROPOFOL 500 MG/50ML IV EMUL
INTRAVENOUS | Status: DC | PRN
  Administered 2019-06-17: 150 ug/kg/min via INTRAVENOUS

## 2019-06-17 MED ORDER — LACTATED RINGERS IV SOLN: INTRAVENOUS | Status: DC

## 2019-06-17 MED ORDER — FENTANYL CITRATE (PF) 100 MCG/2ML IJ SOLN
25.0000 ug | INTRAMUSCULAR | Status: DC | PRN
  Administered 2019-06-17: 50 ug via INTRAVENOUS

## 2019-06-17 MED ORDER — OXYCODONE HCL 5 MG PO TABS: 5.0000 mg | ORAL_TABLET | Freq: Once | ORAL | Status: DC | PRN

## 2019-06-17 MED ORDER — KCL IN DEXTROSE-NACL 20-5-0.45 MEQ/L-%-% IV SOLN
INTRAVENOUS | Status: DC
  Filled 2019-06-17 (×2): qty 1000

## 2019-06-17 MED ORDER — PROPOFOL 10 MG/ML IV BOLUS
INTRAVENOUS | Status: DC | PRN
  Administered 2019-06-17: 30 mg via INTRAVENOUS
  Administered 2019-06-17: 120 mg via INTRAVENOUS

## 2019-06-17 MED ORDER — ONDANSETRON HCL 4 MG/2ML IJ SOLN: 4.0000 mg | Freq: Four times a day (QID) | INTRAMUSCULAR | Status: DC | PRN

## 2019-06-17 MED ORDER — SUGAMMADEX SODIUM 200 MG/2ML IV SOLN
INTRAVENOUS | Status: DC | PRN
  Administered 2019-06-17: 200 mg via INTRAVENOUS

## 2019-06-17 MED ORDER — TRAMADOL HCL 50 MG PO TABS
50.0000 mg | ORAL_TABLET | Freq: Four times a day (QID) | ORAL | Status: DC | PRN
  Administered 2019-06-17 – 2019-06-18 (×2): 50 mg via ORAL
  Filled 2019-06-17 (×2): qty 1

## 2019-06-17 MED ORDER — FENTANYL CITRATE (PF) 250 MCG/5ML IJ SOLN
INTRAMUSCULAR | Status: AC
  Filled 2019-06-17: qty 5

## 2019-06-17 MED ORDER — ACETAMINOPHEN 650 MG RE SUPP: 650.0000 mg | Freq: Four times a day (QID) | RECTAL | Status: DC | PRN

## 2019-06-17 MED ORDER — ACETAMINOPHEN 325 MG PO TABS: 650.0000 mg | ORAL_TABLET | Freq: Four times a day (QID) | ORAL | Status: DC | PRN

## 2019-06-17 MED ORDER — ONDANSETRON HCL 4 MG/2ML IJ SOLN
INTRAMUSCULAR | Status: AC
  Filled 2019-06-17: qty 2

## 2019-06-17 MED ORDER — AMLODIPINE BESYLATE 5 MG PO TABS
5.0000 mg | ORAL_TABLET | Freq: Every day | ORAL | Status: DC
  Administered 2019-06-17 – 2019-06-18 (×2): 5 mg via ORAL
  Filled 2019-06-17 (×2): qty 1

## 2019-06-17 MED ORDER — CALCIUM CARBONATE 1250 (500 CA) MG PO TABS
2.0000 | ORAL_TABLET | Freq: Three times a day (TID) | ORAL | Status: DC
  Administered 2019-06-18: 1000 mg via ORAL
  Filled 2019-06-17 (×2): qty 1

## 2019-06-17 MED ORDER — ONDANSETRON HCL 4 MG/2ML IJ SOLN: 4.0000 mg | Freq: Once | INTRAMUSCULAR | Status: DC | PRN

## 2019-06-17 MED ORDER — FENTANYL CITRATE (PF) 250 MCG/5ML IJ SOLN
INTRAMUSCULAR | Status: DC | PRN
  Administered 2019-06-17: 25 ug via INTRAVENOUS
  Administered 2019-06-17: 100 ug via INTRAVENOUS
  Administered 2019-06-17: 25 ug via INTRAVENOUS

## 2019-06-17 MED ORDER — LIDOCAINE 2% (20 MG/ML) 5 ML SYRINGE
INTRAMUSCULAR | Status: AC
  Filled 2019-06-17: qty 5

## 2019-06-17 SURGICAL SUPPLY — 28 items
ATTRACTOMAT 16X20 MAGNETIC DRP (DRAPES) ×2 IMPLANT
BLADE SURG 15 STRL LF DISP TIS (BLADE) ×1 IMPLANT
BLADE SURG 15 STRL SS (BLADE) ×1
CHLORAPREP W/TINT 26 (MISCELLANEOUS) ×2 IMPLANT
CLIP VESOCCLUDE MED 6/CT (CLIP) ×6 IMPLANT
CLIP VESOCCLUDE SM WIDE 6/CT (CLIP) ×6 IMPLANT
COVER SURGICAL LIGHT HANDLE (MISCELLANEOUS) ×2 IMPLANT
COVER WAND RF STERILE (DRAPES) IMPLANT
DERMABOND ADVANCED (GAUZE/BANDAGES/DRESSINGS) ×1
DERMABOND ADVANCED .7 DNX12 (GAUZE/BANDAGES/DRESSINGS) ×1 IMPLANT
DRAPE LAPAROTOMY T 98X78 PEDS (DRAPES) ×2 IMPLANT
ELECT REM PT RETURN 15FT ADLT (MISCELLANEOUS) ×2 IMPLANT
GAUZE 4X4 16PLY RFD (DISPOSABLE) ×2 IMPLANT
GLOVE SURG ORTHO 8.0 STRL STRW (GLOVE) ×2 IMPLANT
GOWN STRL REUS W/TWL XL LVL3 (GOWN DISPOSABLE) ×6 IMPLANT
HEMOSTAT SURGICEL 2X4 FIBR (HEMOSTASIS) ×2 IMPLANT
ILLUMINATOR WAVEGUIDE N/F (MISCELLANEOUS) ×2 IMPLANT
KIT BASIN OR (CUSTOM PROCEDURE TRAY) ×2 IMPLANT
KIT TURNOVER KIT A (KITS) ×2 IMPLANT
PACK BASIC VI WITH GOWN DISP (CUSTOM PROCEDURE TRAY) ×2 IMPLANT
PENCIL SMOKE EVACUATOR (MISCELLANEOUS) ×2 IMPLANT
SHEARS HARMONIC 9CM CVD (BLADE) ×2 IMPLANT
SUT MNCRL AB 4-0 PS2 18 (SUTURE) ×2 IMPLANT
SUT VIC AB 3-0 SH 18 (SUTURE) ×4 IMPLANT
SYR BULB IRRIGATION 50ML (SYRINGE) ×2 IMPLANT
TOWEL OR 17X26 10 PK STRL BLUE (TOWEL DISPOSABLE) ×2 IMPLANT
TOWEL OR NON WOVEN STRL DISP B (DISPOSABLE) ×2 IMPLANT
TUBING CONNECTING 10 (TUBING) ×2 IMPLANT

## 2019-06-17 NOTE — Transfer of Care (Signed)
Immediate Anesthesia Transfer of Care Note  Patient: Cassidy [name]  Procedure(s) Performed: TOTAL THYROIDECTOMY (N/A )  Patient Location: PACU  Anesthesia Type:General  Level of Consciousness: drowsy, patient cooperative and responds to stimulation  Airway & Oxygen Therapy: Patient Spontanous Breathing and Patient connected to face mask oxygen  Post-op Assessment: Report given to RN and Post -op Vital signs reviewed and stable  Post vital signs: Reviewed and stable  Last Vitals:  Vitals Value Taken Time  BP 125/68 06/17/19 1559  Temp 37.1 C 06/17/19 1559  Pulse 86 06/17/19 1600  Resp 28 06/17/19 1600  SpO2 98 % 06/17/19 1600    Last Pain:  Vitals:   06/17/19 1559  TempSrc:   PainSc: (P) 0-No pain         Complications: No apparent anesthesia complications

## 2019-06-17 NOTE — Op Note (Signed)
Procedure Note  Pre-operative Diagnosis:  Thyroid neoplasm of uncertain behavior, multiple thyroid nodules, enlarged thyroid  Post-operative Diagnosis:  same  Surgeon:  Armandina Gemma, MD  Assistant:  Junita Push, PA-Student   Procedure:  Total thyroidectomy  Anesthesia:  General  Estimated Blood Loss:  minimal  Drains: none         Specimen: thyroid to pathology  Indications:  The patient is referred by Dr. Vivia Ewing for surgical evaluation and management of multiple thyroid nodules with cytologic atypia. Patient's primary care physician is Dr. Tommie Ard Baxley. Patient's orthopedic surgeon is Dr. Frederik Pear. Patient was noted on preoperative chest x-ray prior to her knee surgery last summer to have tracheal deviation. Subsequent evaluation demonstrated multiple thyroid nodules including a study on CT scan in August 2020. Today we reviewed her ultrasound report from April 26, 2019. This shows an enlarged thyroid gland with the left lobe measuring 6.7 x 4.7 x 6.3 cm. Right thyroid lobe is borderline enlarged at 5.4 cm. There are bilateral multiple thyroid nodules. Patient underwent fine-needle aspiration biopsy on May 04, 2019. 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 Another nodule on the left side was biopsied with Bethesda category III findings. Both of these biopsies were submitted for molecular genetic testing. These results are still pending. Patient presents on referral from her endocrinologist to discuss total thyroidectomy. Patient has some mild to moderate compressive symptoms including episodes of dyspnea particularly when lying recumbent and she has a frequent globus sensation. Patient states that she has had known thyroid nodules for many years. She has been on thyroid hormone supplementation for hypothyroidism for many years. She currently takes levothyroxine 100 g daily. She has  had no prior head or neck surgery. There is no family history of thyroid disease or thyroid malignancy. Patient does have a distant history of pulmonary embolism and it previously been on anticoagulation. She does still take aspirin. Patient has lymphedema and uses compression devices on her lower extremities. Patient presents today to discuss total thyroidectomy for management of multinodular thyroid goiter with cytologic atypia and mild to moderate compressive symptoms.  Procedure Details: Procedure was done in OR #1 at the [facility]. The patient was brought to the operating room and placed in a supine position on the operating room table. Following administration of general anesthesia, the patient was positioned and then prepped and draped in the usual aseptic fashion. After ascertaining that an adequate level of anesthesia had been achieved, a small Kocher incision was made with #15 blade. Dissection was carried through subcutaneous tissues and platysma.Hemostasis was achieved with the electrocautery. Skin flaps were elevated cephalad and caudad from the thyroid notch to the sternal notch. A Mahorner self-retaining retractor was placed for exposure. Strap muscles were incised in the midline and dissection was begun on the left side.  Strap muscles were reflected laterally.  Left thyroid lobe was markedly enlarged and extended beneath the clavicle into the anterior mediastinum.  The trachea was deviated to the right.  The left lobe was gently mobilized with blunt dissection. Superior pole vessels were dissected out and divided individually between small and medium ligaclips with the harmonic scalpel. The thyroid lobe was rolled anteriorly. Branches of the inferior thyroid artery were divided between small ligaclips with the harmonic scalpel. Inferior venous tributaries were divided between ligaclips. Both the superior and inferior parathyroid glands were identified and preserved on their  vascular pedicles. The recurrent laryngeal  nerve was identified and preserved along its course. The ligament of Gwenlyn Found was released with the electrocautery and the gland was mobilized onto the anterior trachea. Isthmus was mobilized across the midline. There was no pyramidal lobe present. Dry pack was placed in the left neck.  The right thyroid lobe was gently mobilized with blunt dissection. Right thyroid lobe was essentially normal in size with small nodules. Superior pole vessels were dissected out and divided between small and medium ligaclips with the Harmonic scalpel. Superior parathyroid was identified and preserved. Inferior venous tributaries were divided between medium ligaclips with the harmonic scalpel. The right thyroid lobe was rolled anteriorly and the branches of the inferior thyroid artery divided between small ligaclips. The right recurrent laryngeal nerve was identified and preserved along its course. The ligament of Gwenlyn Found was released with the electrocautery. The right thyroid lobe was mobilized onto the anterior trachea and the remainder of the thyroid was dissected off the anterior trachea and the thyroid was completely excised. A suture was used to mark the right lobe. The entire thyroid gland was submitted to pathology for review.  The neck was irrigated with warm saline. Fibrillar was placed throughout the operative field. Strap muscles were approximated in the midline with interrupted 3-0 Vicryl sutures. Platysma was closed with interrupted 3-0 Vicryl sutures. Skin was closed with a running 4-0 Monocryl subcuticular suture. Wound was washed and Dermabond was applied. The patient was awakened from anesthesia and brought to the recovery room. The patient tolerated the procedure well.   Armandina Gemma, MD [facility] Office: [phone]

## 2019-06-17 NOTE — Anesthesia Preprocedure Evaluation (Addendum)
Anesthesia Evaluation  Patient identified by MRN, date of birth, ID band Patient awake    Reviewed: Allergy & Precautions, NPO status , Patient's Chart, lab work & pertinent test results  History of Anesthesia Complications (+) PONVNegative for: history of anesthetic complications  Airway Mallampati: I  TM Distance: >3 FB Neck ROM: Full    Dental  (+) Teeth Intact   Pulmonary PE   Pulmonary exam normal        Cardiovascular hypertension, + DVT  Normal cardiovascular exam     Neuro/Psych negative neurological ROS  negative psych ROS   GI/Hepatic negative GI ROS, Neg liver ROS,   Endo/Other  Hypothyroidism   Renal/GU negative Renal ROS  negative genitourinary   Musculoskeletal  (+) Arthritis ,   Abdominal   Peds  Hematology negative hematology ROS (+)   Anesthesia Other Findings   Reproductive/Obstetrics                            Anesthesia Physical Anesthesia Plan  ASA: III  Anesthesia Plan: General   Post-op Pain Management:    Induction: Intravenous  PONV Risk Score and Plan: 4 or greater and Ondansetron, Dexamethasone, Treatment may vary due to age or medical condition, Midazolam, Propofol infusion and TIVA  Airway Management Planned: Oral ETT  Additional Equipment: None  Intra-op Plan:   Post-operative Plan: Extubation in OR  Informed Consent: I have reviewed the patients History and Physical, chart, labs and discussed the procedure including the risks, benefits and alternatives for the proposed anesthesia with the patient or authorized representative who has indicated his/her understanding and acceptance.     Dental advisory given  Plan Discussed with:   Anesthesia Plan Comments:       Anesthesia Quick Evaluation

## 2019-06-17 NOTE — Anesthesia Procedure Notes (Signed)
Procedure Name: Intubation Date/Time: 06/17/2019 2:06 PM Performed by: Glory Buff, CRNA Pre-anesthesia Checklist: Patient identified, Emergency Drugs available, Suction available and Patient being monitored Patient Re-evaluated:Patient Re-evaluated prior to induction Oxygen Delivery Method: Circle system utilized Preoxygenation: Pre-oxygenation with 100% oxygen Induction Type: IV induction Ventilation: Mask ventilation without difficulty Laryngoscope Size: Glidescope and 3 Grade View: Grade I Tube type: Oral Tube size: 7.0 mm Number of attempts: 3 Airway Equipment and Method: Stylet and Oral airway Placement Confirmation: ETT inserted through vocal cords under direct vision,  positive ETCO2 and breath sounds checked- equal and bilateral Secured at: 21 cm Tube secured with: Tape Dental Injury: Teeth and Oropharynx as per pre-operative assessment  Difficulty Due To: Difficulty was unanticipated, Difficult Airway- due to reduced neck mobility, Difficult Airway- due to anterior larynx and Difficult Airway- due to limited oral opening Future Recommendations: Recommend- induction with short-acting agent, and alternative techniques readily available Comments: DL x 1 with Miller 3 and unable to see glottic opening, extra pillow under head to increase sniffing position, DL x 2 with glidescope #3 glottic opening seen, oral intubation with + ETCO2,  VSS through out procedure with easy mask airway between attempts.

## 2019-06-17 NOTE — Anesthesia Postprocedure Evaluation (Signed)
Anesthesia Post Note  Patient: Cassidy [name]  Procedure(s) Performed: TOTAL THYROIDECTOMY (N/A )     Patient location during evaluation: PACU Anesthesia Type: General Level of consciousness: awake and alert Pain management: pain level controlled Vital Signs Assessment: post-procedure vital signs reviewed and stable Respiratory status: spontaneous breathing, nonlabored ventilation and respiratory function stable Cardiovascular status: blood pressure returned to baseline and stable Postop Assessment: no apparent nausea or vomiting Anesthetic complications: no    Last Vitals:  Vitals:   06/17/19 1645 06/17/19 1700  BP: 137/82 137/79  Pulse: 91 90  Resp: (!) 22 20  Temp:  37.1 C  SpO2: 95% 96%    Last Pain:  Vitals:   06/17/19 1700  TempSrc:   PainSc: Asleep                 Lidia Collum

## 2019-06-17 NOTE — Interval H&P Note (Signed)
History and Physical Interval Note:  06/17/2019 1:24 PM  [name]  has presented today for surgery, with the diagnosis of THRYOID NEOPLASM OF UNCERTAIN BEHAVIOR, ENLARGED THYROID.  The various methods of treatment have been discussed with the patient and family. After consideration of risks, benefits and other options for treatment, the patient has consented to    Procedure(s): TOTAL THYROIDECTOMY (N/A) as a surgical intervention.    The patient's history has been reviewed, patient examined, no change in status, stable for surgery.  I have reviewed the patient's chart and labs.  Questions were answered to the patient's satisfaction.    Armandina Gemma, MD [facility] Office: [place]

## 2019-06-18 DIAGNOSIS — Z7982 Long term (current) use of aspirin: Secondary | ICD-10-CM | POA: Diagnosis not present

## 2019-06-18 DIAGNOSIS — Z86711 Personal history of pulmonary embolism: Secondary | ICD-10-CM | POA: Diagnosis not present

## 2019-06-18 DIAGNOSIS — I10 Essential (primary) hypertension: Secondary | ICD-10-CM | POA: Diagnosis not present

## 2019-06-18 DIAGNOSIS — Z79890 Hormone replacement therapy (postmenopausal): Secondary | ICD-10-CM | POA: Diagnosis not present

## 2019-06-18 DIAGNOSIS — C73 Malignant neoplasm of thyroid gland: Secondary | ICD-10-CM | POA: Diagnosis not present

## 2019-06-18 DIAGNOSIS — Z86718 Personal history of other venous thrombosis and embolism: Secondary | ICD-10-CM | POA: Diagnosis not present

## 2019-06-18 LAB — BASIC METABOLIC PANEL
Anion gap: 9 (ref 5–15)
BUN: 16 mg/dL (ref 8–23)
CO2: 26 mmol/L (ref 22–32)
Calcium: 9 mg/dL (ref 8.9–10.3)
Chloride: 105 mmol/L (ref 98–111)
Creatinine, Ser: 0.73 mg/dL (ref 0.44–1.00)
GFR calc Af Amer: >60 mL/min (ref >60)
GFR calc non Af Amer: >60 mL/min (ref >60)
Glucose, Bld: 157 mg/dL — ABNORMAL HIGH (ref 70–99)
Potassium: 4.4 mmol/L (ref 3.5–5.1)
Sodium: 140 mmol/L (ref 135–145)

## 2019-06-18 MED ORDER — CALCIUM CARBONATE ANTACID 500 MG PO CHEW: 2.0000 | CHEWABLE_TABLET | Freq: Two times a day (BID) | ORAL | 1 refills | Status: DC

## 2019-06-18 MED ORDER — TRAMADOL HCL 50 MG PO TABS: 50.0000 mg | ORAL_TABLET | Freq: Four times a day (QID) | ORAL | 0 refills | Status: DC | PRN

## 2019-06-18 NOTE — Patient Instructions (Signed)
Being evaluated by endocrinologist for thyroid mass.  May need surgery.  Continue current medications and follow-up in 6 months.

## 2019-06-18 NOTE — Discharge Summary (Signed)
Physician Discharge Summary [facility]  Patient ID: Cassidy [name] MRN: [id number] DOB/AGE: [birth year] 79 y.o.  Admit date: 06/17/2019 Discharge date: 06/18/2019  Admission Diagnoses:  Thyroid neoplasm of uncertain behavior, multiple thyroid nodules  Discharge Diagnoses:  Principal Problem:   Neoplasm of uncertain behavior of thyroid gland Active Problems:   Multiple thyroid nodules   Discharged Condition: good  Hospital Course: Patient was admitted for observation following thyroid surgery.  Post op course was uncomplicated.  Pain was well controlled.  Tolerated diet.  Post op calcium level on morning following surgery was 9.1 mg/dl.  Patient was prepared for discharge home on POD#1.  Consults: None  Treatments: surgery: total thyroidectomy  Discharge Exam: Blood pressure 125/78, pulse 91, temperature (!) 97.4 F (36.3 C), temperature source Oral, resp. rate 20, height 5\' 5"  (1.651 m), weight 98 kg, SpO2 97 %. HEENT - clear Neck - wound dry and intact; mild STS; voice normal Chest - clear bilaterally Cor - RRR  Disposition: Home  Discharge Instructions    Diet - low sodium heart healthy   Complete by: As directed    Discharge instructions   Complete by: As directed    [facility]  THYROID & PARATHYROID SURGERY:  POST-OP INSTRUCTIONS  Always review your discharge instruction sheet from the facility where your surgery was performed.  A prescription for pain medication may be given to you upon discharge.  Take your pain medication as prescribed.  If narcotic pain medicine is not needed, then you may take acetaminophen (Tylenol) or ibuprofen (Advil) as needed.  Take your usually prescribed medications unless otherwise directed.  If you need a refill on your pain medication, please contact our office during regular business hours.  Prescriptions cannot be processed by our office after 5 pm or on weekends.  Start with a light  diet upon arrival home, such as soup and crackers or toast.  Be sure to drink plenty of fluids daily.  Resume your normal diet the day after surgery.  Most patients will experience some swelling and bruising on the chest and neck area.  Ice packs will help.  Swelling and bruising can take several days to resolve.   It is common to experience some constipation after surgery.  Increasing fluid intake and taking a stool softener (Colace) will usually help or prevent this problem.  A mild laxative (Milk of Magnesia or Miralax) should be taken according to package directions if there has been no bowel movement after 48 hours.  You have steri-strips and a gauze dressing over your incision.  You may remove the gauze bandage on the second day after surgery, and you may shower at that time.  Leave your steri-strips (small skin tapes) in place directly over the incision.  These strips should remain on the skin for 5-7 days and then be removed.  You may get them wet in the shower and pat them dry.  You may resume regular (light) daily activities beginning the next day (such as daily self-care, walking, climbing stairs) gradually increasing activities as tolerated.  You may have sexual intercourse when it is comfortable.  Refrain from any heavy lifting or straining until approved by your doctor.  You may drive when you no longer are taking prescription pain medication, you can comfortably wear a seatbelt, and you can safely maneuver your car and apply brakes.  You should see your doctor in the office for a follow-up appointment approximately three weeks after your  surgery.  Make sure that you call for this appointment within a day or two after you arrive home to insure a convenient appointment time.  WHEN TO CALL YOUR DOCTOR: -- Fever greater than 101.5 -- Inability to urinate -- Nausea and/or vomiting - persistent -- Extreme swelling or bruising -- Continued bleeding from incision -- Increased pain, redness,  or drainage from the incision -- Difficulty swallowing or breathing -- Muscle cramping or spasms -- Numbness or tingling in hands or around lips  The clinic staff is available to answer your questions during regular business hours.  Please don't hesitate to call and ask to speak to one of the nurses if you have concerns.  Armandina Gemma, MD [facility] Office: [phone]   Increase activity slowly   Complete by: As directed    No dressing needed   Complete by: As directed      Allergies as of 06/18/2019   No Known Allergies     Medication List    TAKE these medications   amLODipine 5 MG tablet Commonly known as: NORVASC TAKE ONE TABLET DAILY What changed:   how much to take  how to take this  when to take this  additional instructions   apixaban 2.5 MG Tabs tablet Commonly known as: Eliquis Take 1 tablet (2.5 mg total) by mouth 2 (two) times daily.   APPLE CIDER VINEGAR PO Take 1 tablet by mouth daily.   aspirin EC 81 MG tablet Take 81 mg by mouth daily.   calcium carbonate 500 MG chewable tablet Commonly known as: Tums Chew 2 tablets (400 mg of elemental calcium total) by mouth 2 (two) times daily.   furosemide 40 MG tablet Commonly known as: LASIX TAKE ONE TABLET BY MOUTH ONCE DAILY What changed:   how much to take  how to take this  when to take this  additional instructions   multivitamin with minerals Tabs tablet Take 1 tablet by mouth daily with lunch.   oxyCODONE-acetaminophen 5-325 MG tablet Commonly known as: PERCOCET/ROXICET Take 1 tablet by mouth every 4 (four) hours as needed for severe pain.   Synthroid 100 MCG tablet Generic drug: levothyroxine TAKE ONE TABLET EVERY DAY What changed:   how much to take  how to take this  when to take this  additional instructions   tiZANidine 2 MG tablet Commonly known as: ZANAFLEX Take 1 tablet (2 mg total) by mouth every 6 (six) hours as needed.   traMADol 50 MG  tablet Commonly known as: ULTRAM Take 1-2 tablets (50-100 mg total) by mouth every 6 (six) hours as needed.        Earnstine Regal, MD, [facility] Office: [phone]   Signed: Armandina Gemma 06/18/2019, 8:23 AM

## 2019-06-20 LAB — SURGICAL PATHOLOGY

## 2019-06-21 ENCOUNTER — Telehealth: Payer: Self-pay | Admitting: Internal Medicine

## 2019-06-21 NOTE — Telephone Encounter (Signed)
Patient called back and scheduled virtual visit

## 2019-06-21 NOTE — Telephone Encounter (Signed)
LVM to CB and schedule virtual visit for tomorrow

## 2019-06-21 NOTE — Telephone Encounter (Signed)
Can she do virtual visit tomorrow?

## 2019-06-21 NOTE — Telephone Encounter (Signed)
Cassidy [name] Peter Garter [phone]  Cassidy [name] called to see if she could possible get a prescription for Xanax sent to pharmacy below. She had Tyroid removed on Friday 06/17/2019 and is unable to sleep, she is not taking any pain medication at this time.  [place], [place] - [address] Phone:  [phone]  Fax:  [phone]

## 2019-06-22 ENCOUNTER — Encounter: Payer: Self-pay | Admitting: Internal Medicine

## 2019-06-22 ENCOUNTER — Other Ambulatory Visit: Payer: Self-pay

## 2019-06-22 ENCOUNTER — Ambulatory Visit (INDEPENDENT_AMBULATORY_CARE_PROVIDER_SITE_OTHER): Payer: Medicare Other | Admitting: Internal Medicine

## 2019-06-22 VITALS — Ht 64.0 in

## 2019-06-22 DIAGNOSIS — F5109 Other insomnia not due to a substance or known physiological condition: Secondary | ICD-10-CM | POA: Diagnosis not present

## 2019-06-22 MED ORDER — ALPRAZOLAM 0.25 MG PO TABS: 0.2500 mg | ORAL_TABLET | Freq: Every evening | ORAL | 1 refills | Status: DC | PRN

## 2019-06-22 NOTE — Progress Notes (Addendum)
   Subjective:    Patient ID: Cassidy [name], female    DOB: 08-16-1940, 79 y.o.   MRN: [id number]  HPI Mrs. Pelc called having insomnia symptoms after her recent thyroidectomy. She took an OTC preparation last night and slept well but would like to have Xanax 0.5 mg tablets on hand if she needs it.She used to take Xanax if she had to speak with her ex-husband on occasion and always used it responsibly.   She underwent total thyroidectomy by Dr. Harlow Asa Feb 26. Is recovering well at home.Pathology shows papillary thyroid carcinoma and margins are negative. No lymphovascular invasion. She will have follow up with Dr. Kelton Pillar endocrinologist. Longstanding history of hypothyroidism treated with thyroid replacement medication.  Records reviewed in detail.  Due to the Coronavirus pandemic, patient was booked for audiovisual virtual appointment.  However visual communications failed and we proceeded with audio only.  She is identified using two identifiers as [place]. [place], a longstanding patient in this practice using two identifiers.  She is agreeable to visit in this format today.    Review of Systems other than insomnia no complaints. No fever chills headache SOB chest pain     Objective:   Physical Exam Patient reports that she is afebrile and having issues falling asleep       Assessment & Plan:  Insomnia status post thyroidectomy  Surgery for papillary carcinoma status post total thyroidectomy by Dr. Harlow Asa with follow-up with Endocrinologist.  Plan: Xanax 0.25 mg #31 p.o. nightly as needed sleep with one refill.    Time spent with patient including review of records and medical decision making is 15 minutes

## 2019-06-22 NOTE — Patient Instructions (Signed)
Xanax 0.25 mg at bedtime as needed for insomnia.  Follow-up with Dr. Gayla Medicus for regarding thyroid replacement medication/management of recent papillary thyroid carcinoma tumor that was removed

## 2019-06-27 ENCOUNTER — Encounter: Payer: Self-pay | Admitting: Podiatry

## 2019-06-27 ENCOUNTER — Ambulatory Visit (INDEPENDENT_AMBULATORY_CARE_PROVIDER_SITE_OTHER): Payer: Medicare Other | Admitting: Podiatry

## 2019-06-27 ENCOUNTER — Other Ambulatory Visit: Payer: Self-pay

## 2019-06-27 DIAGNOSIS — Q828 Other specified congenital malformations of skin: Secondary | ICD-10-CM | POA: Diagnosis not present

## 2019-06-27 DIAGNOSIS — B351 Tinea unguium: Secondary | ICD-10-CM | POA: Diagnosis not present

## 2019-06-27 DIAGNOSIS — M79676 Pain in unspecified toe(s): Secondary | ICD-10-CM | POA: Diagnosis not present

## 2019-06-27 DIAGNOSIS — G629 Polyneuropathy, unspecified: Secondary | ICD-10-CM

## 2019-06-27 NOTE — Progress Notes (Signed)
Done

## 2019-06-27 NOTE — Progress Notes (Signed)
Patient ID: Cassidy [name], female   DOB: [birth year], 79 y.o.   MRN: [id number]  Subjective: Cassidy [name], 79 year old female presents the office today for concerns of thick painful toenails and calluses to both of her feet. She denies any redness or drainage from along the area.  No other complaints at this time in no acute changes since last appointment.   Objective: AAO 3, NAD DP/PT pulses are palpable, CRT less than 3 seconds  Protective sensation absent with Derrel Nip monofilament Hyperkeratotic lesion present right plantar hallux. Hyperkeratotic lesions were also present medial first MTPJ on the left and submetatarsal 5 bilaterally and left submetatarsal 2. After debridement there were no open lesions no underlying ulceration, drainage, or signs of infection.  Nails are hypertrophic, dystrophic, brittle, discolored 9. There is tenderness palpation to the nails.  No pain with calf compression, swelling, warmth, erythema.  Assessment: 79 year old female with pre-ulcerative calluses bilaterally, symptomatic onychomycosis  Plan: -Treatment options discussed including all alternatives, risks, and complications -Nail sharply debrided 9 without complications/bleeding -Hyperkeratotic lesion sharply debrided 4 without complications -Discussed the importance of daily foot inspection -Follow-up 3 months or sooner if any problems arise. In the meantime, encouraged to call the office with any questions, concerns, change in symptoms.   Celesta Gentile, DPM

## 2019-07-14 DIAGNOSIS — E890 Postprocedural hypothyroidism: Secondary | ICD-10-CM | POA: Diagnosis not present

## 2019-07-25 DIAGNOSIS — D225 Melanocytic nevi of trunk: Secondary | ICD-10-CM | POA: Diagnosis not present

## 2019-07-25 DIAGNOSIS — D1801 Hemangioma of skin and subcutaneous tissue: Secondary | ICD-10-CM | POA: Diagnosis not present

## 2019-07-25 DIAGNOSIS — L821 Other seborrheic keratosis: Secondary | ICD-10-CM | POA: Diagnosis not present

## 2019-07-25 DIAGNOSIS — L578 Other skin changes due to chronic exposure to nonionizing radiation: Secondary | ICD-10-CM | POA: Diagnosis not present

## 2019-07-25 DIAGNOSIS — L814 Other melanin hyperpigmentation: Secondary | ICD-10-CM | POA: Diagnosis not present

## 2019-07-25 DIAGNOSIS — Z85820 Personal history of malignant melanoma of skin: Secondary | ICD-10-CM | POA: Diagnosis not present

## 2019-07-25 DIAGNOSIS — Z86018 Personal history of other benign neoplasm: Secondary | ICD-10-CM | POA: Diagnosis not present

## 2019-07-25 DIAGNOSIS — D485 Neoplasm of uncertain behavior of skin: Secondary | ICD-10-CM | POA: Diagnosis not present

## 2019-08-02 ENCOUNTER — Other Ambulatory Visit: Payer: Self-pay

## 2019-08-04 ENCOUNTER — Other Ambulatory Visit: Payer: Self-pay

## 2019-08-04 ENCOUNTER — Encounter: Payer: Self-pay | Admitting: Internal Medicine

## 2019-08-04 ENCOUNTER — Ambulatory Visit (INDEPENDENT_AMBULATORY_CARE_PROVIDER_SITE_OTHER): Payer: Medicare Other | Admitting: Internal Medicine

## 2019-08-04 VITALS — BP 124/78 | HR 98 | Temp 98.2°F | Ht 64.0 in | Wt 221.4 lb

## 2019-08-04 DIAGNOSIS — C73 Malignant neoplasm of thyroid gland: Secondary | ICD-10-CM | POA: Diagnosis not present

## 2019-08-04 DIAGNOSIS — E890 Postprocedural hypothyroidism: Secondary | ICD-10-CM

## 2019-08-04 LAB — T4, FREE: Free T4: 0.75 ng/dL (ref 0.60–1.60)

## 2019-08-04 LAB — TSH: TSH: 19.38 u[IU]/mL — ABNORMAL HIGH (ref 0.35–4.50)

## 2019-08-04 MED ORDER — LEVOTHYROXINE SODIUM 125 MCG PO CAPS: 125.0000 ug | ORAL_CAPSULE | Freq: Every day | ORAL | 3 refills | Status: DC

## 2019-08-04 MED ORDER — LEVOTHYROXINE SODIUM 125 MCG PO TABS: 125.0000 ug | ORAL_TABLET | Freq: Every day | ORAL | 1 refills | Status: DC

## 2019-08-04 NOTE — Progress Notes (Signed)
Name: Cassidy [name]  MRN/ DOB: [id number], 1940-10-07    Age/ Sex: 79 y.o., female     PCP: Elby Showers, MD   Reason for Endocrinology Evaluation: Left thyroid nodule      Initial Endocrinology Clinic Visit: 04/05/2020    PATIENT IDENTIFIER: Cassidy [name] is a 79 y.o., female with a past medical history of HTN, Hx of DVT's and melanoma . She has followed with [facility] since 04/05/2020  for consultative assistance with management of her MNG.      HISTORICAL SUMMARY:  Pt was noted to have a tracheal deviation on CXR during pre-op evaluation. A CT scan showed a left thyroid necrotic mass that measures ~ 6.5 cm. 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   Afirma came back benign for the 2.6 cm nodule but no afirma results  for the 5.9 nodule due to low follicular content.  She is S/P total thyroidectomy on 06/17/2019 with a pathology report of 6.0 cm PTC  Of note she has been on LT- 4 replacement for years  SUBJECTIVE:   During last visit (04/06/2019): Thyroid ultrasound ordered  Today (08/04/2019):  Ms. Killings is here for a follow up on total thyroidectomy and a PTC diagnosis.   She is recovering well , she was maintained on pre-op LT- 4 replacement dose   Her energy level is normal, she noted resolution of dysphagia the she had pre-op  No constipation - has occasional loose stools Started on Tums post-op as a preventative measure, but would like to continue as she has noted it helps with some of her GI issues No post-tingling and numbness.    ROS:  As per HPI.   HISTORY:  Past Medical History:  Past Medical History:  Diagnosis Date  . Anemia    with pregnancy  .  Anxiety   . Arthritis   . Cancer ([place])    right foot,top of arm, lympth nodes removed  . Dependent edema   . DVT (deep venous thrombosis) ([place])   . Fatty liver   . Heart murmur    occ  . HTN (hypertension), benign   . Hyperlipidemia   . Hypothyroidism   . Lymphedema   . Melanoma ([place])   . Migraine 08/27/2010  . Osteopenia   . PE (pulmonary embolism) 2010   bilateral hx post op knee replacement  . PONV (postoperative nausea and vomiting)     Past Surgical History:  Past Surgical History:  Procedure Laterality Date  . ABDOMINAL HYSTERECTOMY     age 36  . AMPUTATION  11/10/2011   Procedure: AMPUTATION RAY;  Surgeon: Kerin Salen, MD;  Location: [place];  Service: Orthopedics;  Laterality: Left;  LEFT 2ND TOE TRANS MIDDLE PHALANX AMPUTATION   . ANKLE FUSION  2005   lt-fusion  . APPENDECTOMY    . CHOLECYSTECTOMY  02/14/2002  . COLONOSCOPY    . GANGLION CYST EXCISION  2008   Rt wrist  . JOINT REPLACEMENT  2010   rt total knee  . KNEE ARTHROSCOPY  6/03   Rt knee  . OVARIAN CYST SURGERY    . RECTOCELE REPAIR  2009  . REDUCTION MAMMAPLASTY Bilateral   . THYROIDECTOMY N/A 06/17/2019  Procedure: TOTAL THYROIDECTOMY;  Surgeon: Armandina Gemma, MD;  Location[facility];  Service: General;  Laterality: N/A;  . TOTAL KNEE ARTHROPLASTY Left 12/06/2018   Procedure: Left Knee Arthroplasty;  Surgeon: Frederik Pear, MD;  [facility];  Service: Orthopedics;  Laterality: Left;  . UPPER GI ENDOSCOPY       Social History:  reports that she has never smoked. She has never used smokeless tobacco. She reports that she does not drink alcohol or use drugs. Family History:  Family History  Problem Relation Age of Onset  . Heart disease Mother   . Heart disease Father   . Diabetes Brother   . Heart disease Brother   . Breast cancer Daughter 66      HOME MEDICATIONS: Allergies as of 08/04/2019   No Known Allergies     Medication List       Accurate as of August 04, 2019  8:42 AM. If you have any questions, ask your nurse or doctor.        ALPRAZolam 0.25 MG tablet Commonly known as: XANAX Take 1 tablet (0.25 mg total) by mouth at bedtime as needed for anxiety.   amLODipine 5 MG tablet Commonly known as: NORVASC TAKE ONE TABLET DAILY What changed:   how much to take  how to take this  when to take this  additional instructions   apixaban 2.5 MG Tabs tablet Commonly known as: Eliquis Take 1 tablet (2.5 mg total) by mouth 2 (two) times daily.   APPLE CIDER VINEGAR PO Take 1 tablet by mouth daily.   aspirin EC 81 MG tablet Take 81 mg by mouth daily.   calcium carbonate 500 MG chewable tablet Commonly known as: Tums Chew 2 tablets (400 mg of elemental calcium total) by mouth 2 (two) times daily.   furosemide 40 MG tablet Commonly known as: LASIX TAKE ONE TABLET BY MOUTH ONCE DAILY What changed:   how much to take  how to take this  when to take this  additional instructions   multivitamin with minerals Tabs tablet Take 1 tablet by mouth daily with lunch.   oxyCODONE-acetaminophen 5-325 MG tablet Commonly known as: PERCOCET/ROXICET Take 1 tablet by mouth every 4 (four) hours as needed for severe pain.   Synthroid 100 MCG tablet Generic drug: levothyroxine TAKE ONE TABLET EVERY DAY What changed:   how much to take  how to take this  when to take this  additional instructions   tiZANidine 2 MG tablet Commonly known as: ZANAFLEX Take 1 tablet (2 mg total) by mouth every 6 (six) hours as needed.   traMADol 50 MG tablet Commonly known as: ULTRAM Take 1-2 tablets (50-100 mg total) by mouth every 6 (six) hours as needed.         OBJECTIVE:   PHYSICAL EXAM: VS: BP 124/78 (BP Location: Left Arm, Patient Position: Sitting, Cuff Size: Large)   Pulse 98   Temp 98.2 F (36.8 C)   Ht '5\' 4"'$  (1.626 m)   Wt 221 lb 6.4 oz (100.4 kg)   SpO2 98%   BMI 38.00 kg/m    EXAM: General: Pt appears well and is in NAD   Neck: General: Supple without adenopathy. Thyroid: Surgical scar healed well, some subcut. Firmness noted   Lungs: Clear with good BS bilat with no rales, rhonchi, or wheezes  Heart: Auscultation: RRR.  Abdomen: Normoactive bowel sounds, soft, nontender, without masses or organomegaly palpable  Extremities:  BL LE: None pitting  pretibial edema  present  Mental Status: Judgment, insight: Intact Orientation: Oriented to time, place, and person Mood and affect: No depression, anxiety, or agitation     DATA REVIEWED:  Results for EUTHA, CUDE" (MRN [id number]) as of 08/08/2019 07:47  Ref. Range 08/04/2019 09:16  TSH Latest Ref Range: 0.35 - 4.50 uIU/mL 19.38 (H)  T4,Free(Direct) Latest Ref Range: 0.60 - 1.60 ng/dL 0.75  Thyroglobulin Latest Units: ng/mL 0.5 (L)  Thyroglobulin Ab Latest Ref Range: < or = 1 IU/mL 59 (H)     Pathology report 06/17/2019 Procedure: Total thyroidectomy  Tumor Focality: Unifocal  Tumor Site: Left thyroid lobe  Tumor Size: 6.0 cm  Histologic Type: Papillary thyroid carcinoma, follicular variant  Margins: Uninvolved by tumor  Angioinvasion: Not identified  Lymphatic Invasion: Not identified  Extrathyroidal extension: Not identified  Regional Lymph Nodes: No lymph nodes submitted or found  Pathologic Stage Classification (pTNM, AJCC 8th Edition): pT3a, pNx  field    ASSESSMENT / PLAN / RECOMMENDATIONS:   1. Papillary thyroid carcinoma ( pT3a, pNx) , Stage II  - Risk of recurrence is low based on the initial risk stratification- this could change following the whole body scan - TSH goal for now is 0.1-0.5 uIU/mL  - Tg undetectable but Tg Ab are elevated, this will be used as a baseline - Will proceed with WBS followed by RAI ablation   2. Post-Operative Hypothyroidism:   - Pt is clinically euthyroid  - She is biochemically euthyroid , will adjust her LT-4 replacement as below  - Pt educated on the correct way to take levothyroxine  (first thing in the morning with water, 30 minutes before eating or taking other medications). - Pt encouraged to double dose the following day if she were to miss a dose given long half-life of levothyroxine.   Medications  Stop Levothyroxine 100 mcg daily  Start Levothyroxine 125 mcg daily      F/u in 4 months    Signed electronically by: Mack Guise, MD  [facility]  [facility] [address] [place], [place] [address] Phone: [phone] FAX: [phone]      CC: Elby Showers, MD [address] [id number] Phone: [phone]  Fax: [phone]   Return to Endocrinology clinic as below: Future Appointments  Date Time Provider [place]  08/29/2019  8:15 AM Trula Slade, DPM [facility]  10/04/2019  7:30 AM , Melanie Crazier, MD [facility] None

## 2019-08-04 NOTE — Patient Instructions (Addendum)
You are on levothyroxine - which is your thyroid hormone supplement. You MUST take this consistently.  You should take this first thing in the morning on an empty stomach with water. You should not take it with other medications. Wait 6min to 1hr prior to eating. If you are taking any vitamins - please take these in the evening.   If you miss a dose, please take your missed dose the following day (double the dose for that day). You should have a pill box for ONLY levothyroxine on your bedside table to help you remember to take your medications.    - We will set you up for a whole body scan followed by treatment. Please remember the day of the treatment to stop by the labs

## 2019-08-05 DIAGNOSIS — C73 Malignant neoplasm of thyroid gland: Secondary | ICD-10-CM | POA: Insufficient documentation

## 2019-08-05 DIAGNOSIS — E890 Postprocedural hypothyroidism: Secondary | ICD-10-CM | POA: Insufficient documentation

## 2019-08-05 LAB — THYROGLOBULIN LEVEL: Thyroglobulin: 0.5 ng/mL — ABNORMAL LOW

## 2019-08-05 LAB — THYROGLOBULIN ANTIBODY: Thyroglobulin Ab: 59 IU/mL — ABNORMAL HIGH (ref <=1)

## 2019-08-29 ENCOUNTER — Ambulatory Visit (INDEPENDENT_AMBULATORY_CARE_PROVIDER_SITE_OTHER): Payer: Medicare Other | Admitting: Podiatry

## 2019-08-29 ENCOUNTER — Encounter: Payer: Self-pay | Admitting: Podiatry

## 2019-08-29 ENCOUNTER — Other Ambulatory Visit: Payer: Self-pay

## 2019-08-29 VITALS — Temp 98.1°F

## 2019-08-29 DIAGNOSIS — M6740 Ganglion, unspecified site: Secondary | ICD-10-CM

## 2019-08-29 DIAGNOSIS — B351 Tinea unguium: Secondary | ICD-10-CM

## 2019-08-29 DIAGNOSIS — Q828 Other specified congenital malformations of skin: Secondary | ICD-10-CM | POA: Diagnosis not present

## 2019-08-29 DIAGNOSIS — M79676 Pain in unspecified toe(s): Secondary | ICD-10-CM | POA: Diagnosis not present

## 2019-08-29 DIAGNOSIS — G629 Polyneuropathy, unspecified: Secondary | ICD-10-CM | POA: Diagnosis not present

## 2019-08-29 NOTE — Progress Notes (Signed)
Patient ID: Cassidy [name], female   DOB: 07-Sep-1940, 79 y.o.   MRN: [id number]  Subjective: Cassidy [name], 79 year old female presents the office today for concerns of thick painful toenails and calluses to both of her feet. She denies any redness or drainage from along the area.  No other complaints at this time in no acute changes since last appointment.   Objective: AAO 3, NAD DP/PT pulses are palpable, CRT less than 3 seconds  Protective sensation absent with Cassidy [name] monofilament Hyperkeratotic lesion present right plantar hallux. Hyperkeratotic lesions were also present medial first MTPJ on the left and submetatarsal 5 bilaterally and left submetatarsal 2. After debridement there were no open lesions no underlying ulceration, drainage, or signs of infection.  Nails are hypertrophic, dystrophic, brittle, discolored 9. There is tenderness palpation to the nails.  Fluid filled soft tissue masson the dorsal left 4th toe. Clear, jelly like fluid is expressed. No signs of infection.  No pain with calf compression, swelling, warmth, erythema.  Assessment: 79 year old female with pre-ulcerative calluses bilaterally, symptomatic onychomycosis; ganglion cyst   Plan: -Treatment options discussed including all alternatives, risks, and complications -Nail sharply debrided 9 without complications/bleeding -Hyperkeratotic lesion sharply debrided 4 without complications -Drained the ganglion cyst today without any issues. Bandage applied. Monitor daily for any issues.  -Discussed the importance of daily foot inspection -Follow-up as scheduled or sooner if any problems arise. In the meantime, encouraged to call the office with any questions, concerns, change in symptoms.   Celesta Gentile, DPM

## 2019-09-26 ENCOUNTER — Encounter (HOSPITAL_COMMUNITY)
Admission: RE | Admit: 2019-09-26 | Discharge: 2019-09-26 | Disposition: A | Discharge status: Home / Self Care | Payer: Medicare Other | Source: Ambulatory Visit | Attending: Internal Medicine | Admitting: Internal Medicine

## 2019-09-26 ENCOUNTER — Ambulatory Visit (HOSPITAL_COMMUNITY): Payer: Medicare Other

## 2019-09-26 ENCOUNTER — Other Ambulatory Visit: Payer: Self-pay

## 2019-09-26 DIAGNOSIS — C73 Malignant neoplasm of thyroid gland: Secondary | ICD-10-CM | POA: Insufficient documentation

## 2019-09-26 MED ORDER — THYROTROPIN ALFA 1.1 MG IM SOLR
0.9000 mg | INTRAMUSCULAR | Status: AC
  Administered 2019-09-26: 0.9 mg via INTRAMUSCULAR

## 2019-09-26 MED ORDER — THYROTROPIN ALFA 1.1 MG IM SOLR
INTRAMUSCULAR | Status: AC
  Filled 2019-09-26: qty 0.9

## 2019-09-27 ENCOUNTER — Ambulatory Visit (HOSPITAL_COMMUNITY): Payer: Medicare Other

## 2019-09-27 ENCOUNTER — Encounter (HOSPITAL_COMMUNITY)
Admission: RE | Admit: 2019-09-27 | Discharge: 2019-09-27 | Disposition: A | Discharge status: Home / Self Care | Payer: Medicare Other | Source: Ambulatory Visit | Attending: Internal Medicine | Admitting: Internal Medicine

## 2019-09-27 DIAGNOSIS — C73 Malignant neoplasm of thyroid gland: Secondary | ICD-10-CM | POA: Diagnosis not present

## 2019-09-27 MED ORDER — THYROTROPIN ALFA 1.1 MG IM SOLR
INTRAMUSCULAR | Status: AC
  Filled 2019-09-27: qty 0.9

## 2019-09-27 MED ORDER — THYROTROPIN ALFA 1.1 MG IM SOLR
0.9000 mg | INTRAMUSCULAR | Status: AC
  Administered 2019-09-27: 0.9 mg via INTRAMUSCULAR

## 2019-09-28 ENCOUNTER — Other Ambulatory Visit: Payer: Self-pay

## 2019-09-28 ENCOUNTER — Encounter (HOSPITAL_COMMUNITY)
Admission: RE | Admit: 2019-09-28 | Discharge: 2019-09-28 | Disposition: A | Discharge status: Home / Self Care | Payer: Medicare Other | Source: Ambulatory Visit | Attending: Internal Medicine | Admitting: Internal Medicine

## 2019-09-28 ENCOUNTER — Ambulatory Visit (HOSPITAL_COMMUNITY): Payer: Medicare Other

## 2019-09-28 DIAGNOSIS — C73 Malignant neoplasm of thyroid gland: Secondary | ICD-10-CM | POA: Diagnosis not present

## 2019-09-28 MED ORDER — SODIUM IODIDE I 131 CAPSULE
4.1000 | Freq: Once | INTRAVENOUS | Status: AC | PRN
  Administered 2019-09-28: 4.1 via ORAL

## 2019-09-30 ENCOUNTER — Encounter (HOSPITAL_COMMUNITY)
Admission: RE | Admit: 2019-09-30 | Discharge: 2019-09-30 | Disposition: A | Discharge status: Home / Self Care | Payer: Medicare Other | Source: Ambulatory Visit | Attending: Internal Medicine | Admitting: Internal Medicine

## 2019-09-30 ENCOUNTER — Other Ambulatory Visit: Payer: Self-pay

## 2019-09-30 DIAGNOSIS — C73 Malignant neoplasm of thyroid gland: Secondary | ICD-10-CM | POA: Diagnosis not present

## 2019-10-01 ENCOUNTER — Other Ambulatory Visit: Payer: Self-pay | Admitting: Internal Medicine

## 2019-10-03 ENCOUNTER — Telehealth: Payer: Self-pay | Admitting: Internal Medicine

## 2019-10-03 DIAGNOSIS — C73 Malignant neoplasm of thyroid gland: Secondary | ICD-10-CM

## 2019-10-04 ENCOUNTER — Ambulatory Visit: Payer: Medicare Other | Admitting: Internal Medicine

## 2019-10-04 NOTE — Telephone Encounter (Signed)
Discussed WBS with the pt on 10/04/2019 . Pt was also supposed to get RAI treatment but somehow ended up with just the pre-treatment scan.     Based on the results of left lymph node metastases, will proceed with thyroid bed ultrasound first , will proceed with RAI treatment based on ultrasound results.   Pt expressed understanding of the above   Abby Nena Jordan, MD  [facility] Endocrinology  [facility] [address] [place], [place] [address] Phone: [phone] FAX: [phone]

## 2019-10-13 ENCOUNTER — Ambulatory Visit
Admission: RE | Admit: 2019-10-13 | Discharge: 2019-10-13 | Disposition: A | Discharge status: Home / Self Care | Payer: Medicare Other | Source: Ambulatory Visit | Attending: Internal Medicine | Admitting: Internal Medicine

## 2019-10-13 DIAGNOSIS — C73 Malignant neoplasm of thyroid gland: Secondary | ICD-10-CM

## 2019-10-14 ENCOUNTER — Telehealth: Payer: Self-pay | Admitting: Internal Medicine

## 2019-10-14 NOTE — Telephone Encounter (Signed)
Left a message for a call back 10/14/2019 At Harrison, MD  [facility] Endocrinology  [facility] [address] [place], [place] [address] Phone: [phone] FAX: [phone]

## 2019-10-18 ENCOUNTER — Telehealth: Payer: Self-pay | Admitting: Internal Medicine

## 2019-10-18 DIAGNOSIS — C73 Malignant neoplasm of thyroid gland: Secondary | ICD-10-CM

## 2019-10-18 NOTE — Telephone Encounter (Signed)
Discussed ultrasound result with the pt , no evidence of lymph node mets.     Will proceed with RAI treatment.   Pt will need to start iodine diet ~ 10 days prior to treatment , she will stop by the office to have a copy.     Abby Nena Jordan, MD  [facility] Endocrinology  [facility] [address] [place], [place] [address] Phone: [phone] FAX: [phone]

## 2019-10-31 ENCOUNTER — Ambulatory Visit (INDEPENDENT_AMBULATORY_CARE_PROVIDER_SITE_OTHER): Payer: Medicare Other | Admitting: Podiatry

## 2019-10-31 ENCOUNTER — Encounter: Payer: Self-pay | Admitting: Podiatry

## 2019-10-31 ENCOUNTER — Other Ambulatory Visit: Payer: Self-pay

## 2019-10-31 DIAGNOSIS — B351 Tinea unguium: Secondary | ICD-10-CM | POA: Diagnosis not present

## 2019-10-31 DIAGNOSIS — Q828 Other specified congenital malformations of skin: Secondary | ICD-10-CM

## 2019-10-31 DIAGNOSIS — M79676 Pain in unspecified toe(s): Secondary | ICD-10-CM | POA: Diagnosis not present

## 2019-11-01 NOTE — Progress Notes (Signed)
Patient ID: Cassidy [name], female   DOB: 13-Aug-1940, 79 y.o.   MRN: [id number]  Subjective: Cassidy [name], 79 year old female presents the office today for concerns of thick painful toenails and calluses to both of her feet. She denies any redness or drainage from along the area and has no new concerns today.    Recently had her thyroid removed and found to be cancerous. Starting iodine treatment  Objective: AAO 3, NAD DP/PT pulses are palpable, CRT less than 3 seconds  Protective sensation absent with Derrel Nip monofilament Hyperkeratotic lesion present right plantar hallux. Hyperkeratotic lesions were also present medial first MTPJ on the left and submetatarsal 5 bilaterally and left submetatarsal 2. After debridement there were no open lesions no underlying ulceration, drainage, or signs of infection.  Nails are hypertrophic, dystrophic, brittle, discolored 9. There is tenderness palpation to the nails.  Fluid filled soft tissue mass present on the dorsal left 4th toe.  There is no pain, swelling or redness or any signs of infection. No pain with calf compression, swelling, warmth, erythema.  Assessment: 79 year old female with pre-ulcerative calluses bilaterally, symptomatic onychomycosis; ganglion cyst   Plan: -Treatment options discussed including all alternatives, risks, and complications -Nail sharply debrided 9 without complications/bleeding -Hyperkeratotic lesion sharply debrided 4 without complications -Cyst came right back after drainage last appointment.  We will continue to monitor. -Discussed the importance of daily foot inspection -Follow-up as scheduled or sooner if any problems arise. In the meantime, encouraged to call the office with any questions, concerns, change in symptoms.   Cassidy [name], DPM

## 2019-11-10 ENCOUNTER — Telehealth: Payer: Self-pay | Admitting: Internal Medicine

## 2019-11-10 NOTE — Telephone Encounter (Signed)
Can you please check the status of this?

## 2019-11-10 NOTE — Telephone Encounter (Signed)
Patient requests to be called at ph# [phone] re: Patient has been on the prescribed diet for 15 days  for Nuclear Imaging prep, however  no one has called patient to schedule the Nuclear Imaging appointment. Patient states the diet was supposed to be for 10 days, but patient has stayed on diet awaiting Nuclear Imaging Appointment.

## 2019-11-11 NOTE — Telephone Encounter (Signed)
Patient is scheduled and made aware of the dates and times

## 2019-11-28 ENCOUNTER — Other Ambulatory Visit: Payer: Self-pay

## 2019-11-28 ENCOUNTER — Encounter (HOSPITAL_COMMUNITY)
Admission: RE | Admit: 2019-11-28 | Discharge: 2019-11-28 | Disposition: A | Discharge status: Home / Self Care | Payer: Medicare Other | Source: Ambulatory Visit | Attending: Internal Medicine | Admitting: Internal Medicine

## 2019-11-28 DIAGNOSIS — C73 Malignant neoplasm of thyroid gland: Secondary | ICD-10-CM | POA: Diagnosis not present

## 2019-11-28 MED ORDER — THYROTROPIN ALFA 1.1 MG IM SOLR
0.9000 mg | INTRAMUSCULAR | Status: AC
  Administered 2019-11-28: 0.9 mg via INTRAMUSCULAR

## 2019-11-28 MED ORDER — THYROTROPIN ALFA 1.1 MG IM SOLR
INTRAMUSCULAR | Status: AC
  Filled 2019-11-28: qty 0.9

## 2019-11-29 ENCOUNTER — Encounter (HOSPITAL_COMMUNITY)
Admission: RE | Admit: 2019-11-29 | Discharge: 2019-11-29 | Disposition: A | Discharge status: Home / Self Care | Payer: Medicare Other | Source: Ambulatory Visit | Attending: Internal Medicine | Admitting: Internal Medicine

## 2019-11-29 DIAGNOSIS — C73 Malignant neoplasm of thyroid gland: Secondary | ICD-10-CM | POA: Diagnosis not present

## 2019-11-29 MED ORDER — THYROTROPIN ALFA 1.1 MG IM SOLR
INTRAMUSCULAR | Status: AC
  Administered 2019-11-29: 0.9 mg via INTRAMUSCULAR
  Filled 2019-11-29: qty 0.9

## 2019-11-29 MED ORDER — THYROTROPIN ALFA 1.1 MG IM SOLR: 0.9000 mg | INTRAMUSCULAR | Status: AC

## 2019-11-30 ENCOUNTER — Other Ambulatory Visit: Payer: Self-pay

## 2019-11-30 ENCOUNTER — Encounter (HOSPITAL_COMMUNITY)
Admission: RE | Admit: 2019-11-30 | Discharge: 2019-11-30 | Disposition: A | Discharge status: Home / Self Care | Payer: Medicare Other | Source: Ambulatory Visit | Attending: Internal Medicine | Admitting: Internal Medicine

## 2019-11-30 DIAGNOSIS — C73 Malignant neoplasm of thyroid gland: Secondary | ICD-10-CM | POA: Diagnosis not present

## 2019-11-30 MED ORDER — SODIUM IODIDE I 131 CAPSULE
53.5000 | Freq: Once | INTRAVENOUS | Status: AC | PRN
  Administered 2019-11-30: 53.5 via ORAL

## 2019-12-09 ENCOUNTER — Encounter (HOSPITAL_COMMUNITY)
Admission: RE | Admit: 2019-12-09 | Discharge: 2019-12-09 | Disposition: A | Discharge status: Home / Self Care | Payer: Medicare Other | Source: Ambulatory Visit | Attending: Internal Medicine | Admitting: Internal Medicine

## 2019-12-09 ENCOUNTER — Other Ambulatory Visit: Payer: Self-pay

## 2019-12-09 DIAGNOSIS — C73 Malignant neoplasm of thyroid gland: Secondary | ICD-10-CM | POA: Diagnosis not present

## 2019-12-12 ENCOUNTER — Ambulatory Visit (INDEPENDENT_AMBULATORY_CARE_PROVIDER_SITE_OTHER): Payer: Medicare Other | Admitting: Internal Medicine

## 2019-12-12 ENCOUNTER — Other Ambulatory Visit: Payer: Self-pay

## 2019-12-12 ENCOUNTER — Ambulatory Visit: Payer: Medicare Other | Admitting: Internal Medicine

## 2019-12-12 VITALS — BP 108/74 | HR 86 | Wt 211.6 lb

## 2019-12-12 DIAGNOSIS — E890 Postprocedural hypothyroidism: Secondary | ICD-10-CM | POA: Diagnosis not present

## 2019-12-12 DIAGNOSIS — C73 Malignant neoplasm of thyroid gland: Secondary | ICD-10-CM | POA: Diagnosis not present

## 2019-12-12 LAB — TSH: TSH: 14.18 u[IU]/mL — ABNORMAL HIGH (ref 0.35–4.50)

## 2019-12-12 NOTE — Patient Instructions (Addendum)

## 2019-12-12 NOTE — Progress Notes (Signed)
Name: Cassidy [name]  MRN/ DOB: [id number], July 24, 1940    Age/ Sex: 79 y.o., female     PCP: Elby Showers, MD   Reason for Endocrinology Evaluation: [facility]     Initial Endocrinology Clinic Visit: 04/05/2020    PATIENT IDENTIFIER: [name] is a 79 y.o., female with a past medical history of HTN, Hx of DVT's and melanoma . She has followed with [facility] since 04/05/2020  for consultative assistance with management of her MNG.      HISTORICAL SUMMARY:  Pt was noted to have a tracheal deviation on CXR during pre-op evaluation. A CT scan showed a left thyroid necrotic mass that measures ~ 6.5 cm. 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   Afirma came back benign for the 2.6 cm nodule but no afirma results  for the 5.9 nodule due to low follicular content.  She is S/P total thyroidectomy on 06/17/2019 with a pathology report of 6.0 cm PTC   Of note she has been on LT- 4 replacement for years   A Thyrogen WBS 09/2019 showed Focal activity in thyroid bed consistent with remnant thyroid tissue and potential metastatic lymph node. Follow up thyroid bed ultrasound was unrevealing.    She is S/P RAI 53.5 mCi I-131  On 11/30/2019  Post-Op WBS scan demonstrated a Focus of increased tracer uptake at LEFT thyroid bed consistent with thyroid remnant and a  less intense focus of uptake cranial to the more intense focus at the LEFT thyroid bed, question additional thyroid remnant versus regional metastatic lymph node.    SUBJECTIVE:    Today (12/12/2019):  [name] is here for a follow up on total thyroidectomy and a PTC diagnosis.    Pt has been losing weight She feel well overall.    Has occasional lose stools Denies palpitations  No dysphagia       HISTORY:  Past Medical History:  Past Medical History:  Diagnosis Date   Anemia    with pregnancy   Anxiety    Arthritis    Cancer ([place])    right foot,top of arm, lympth nodes removed   Dependent edema    DVT (deep venous thrombosis) ([facility])    Fatty liver    Heart murmur    occ   HTN (hypertension), benign    Hyperlipidemia    Hypothyroidism    Lymphedema    Melanoma ([place])    Migraine 08/27/2010   Osteopenia    PE (pulmonary embolism) 2010   bilateral hx post op knee replacement   PONV (postoperative nausea and vomiting)    Past Surgical History:  Past Surgical History:  Procedure Laterality Date   ABDOMINAL HYSTERECTOMY     age 73   AMPUTATION  11/10/2011   Procedure: AMPUTATION RAY;  Surgeon: Kerin Salen, MD;  Location: [place];  Service: Orthopedics;  Laterality: Left;  LEFT 2ND TOE TRANS MIDDLE PHALANX AMPUTATION    ANKLE FUSION  2005   lt-fusion   APPENDECTOMY     CHOLECYSTECTOMY  02/14/2002   COLONOSCOPY     GANGLION CYST EXCISION  2008   Rt wrist   JOINT REPLACEMENT  2010   rt total knee   KNEE ARTHROSCOPY  6/03   Rt knee   OVARIAN CYST SURGERY     RECTOCELE REPAIR  2009   REDUCTION MAMMAPLASTY Bilateral    THYROIDECTOMY N/A 06/17/2019   Procedure: TOTAL THYROIDECTOMY;  Surgeon: Armandina Gemma, MD;  [facility];  Service: General;  Laterality: N/A;   TOTAL KNEE ARTHROPLASTY Left 12/06/2018   Procedure: Left Knee Arthroplasty;  Surgeon: Frederik Pear, MD;  [facility];  Service: Orthopedics;  Laterality: Left;   UPPER GI ENDOSCOPY      Social History:  reports that she has never smoked. She has never used smokeless tobacco. She reports that she does not drink alcohol and does not use drugs. Family History:  Family History  Problem Relation Age of Onset   Heart disease Mother    Heart disease Father    Diabetes Brother     Heart disease Brother    Breast cancer Daughter 40     HOME MEDICATIONS: Allergies as of 12/12/2019   No Known Allergies     Medication List       Accurate as of December 12, 2019  8:01 AM. If you have any questions, ask your nurse or doctor.        ALPRAZolam 0.25 MG tablet Commonly known as: XANAX Take 1 tablet (0.25 mg total) by mouth at bedtime as needed for anxiety.   amLODipine 5 MG tablet Commonly known as: NORVASC TAKE ONE TABLET DAILY   apixaban 2.5 MG Tabs tablet Commonly known as: Eliquis Take 1 tablet (2.5 mg total) by mouth 2 (two) times daily.   APPLE CIDER VINEGAR PO Take 1 tablet by mouth daily.   aspirin EC 81 MG tablet Take 81 mg by mouth daily.   calcium carbonate 500 MG chewable tablet Commonly known as: Tums Chew 2 tablets (400 mg of elemental calcium total) by mouth 2 (two) times daily.   furosemide 40 MG tablet Commonly known as: LASIX TAKE ONE TABLET DAILY   Levothyroxine Sodium 125 MCG Caps Take 1 capsule (125 mcg total) by mouth daily before breakfast.   levothyroxine 125 MCG tablet Commonly known as: SYNTHROID Take 1 tablet (125 mcg total) by mouth daily before breakfast.   multivitamin with minerals Tabs tablet Take 1 tablet by mouth daily with lunch.   oxyCODONE-acetaminophen 5-325 MG tablet Commonly known as: PERCOCET/ROXICET Take 1 tablet by mouth every 4 (four) hours as needed for severe pain.   tiZANidine 2 MG tablet Commonly known as: ZANAFLEX Take 1 tablet (2 mg total) by mouth every 6 (six) hours as needed.   traMADol 50 MG tablet Commonly known as: ULTRAM Take 1-2 tablets (50-100 mg total) by mouth every 6 (six) hours as needed.         OBJECTIVE:   PHYSICAL EXAM: VS: BP 108/74    Pulse 86    Wt 211 lb 9.6 oz (96 kg)    SpO2 99%    BMI 36.32 kg/m    EXAM: General: Pt appears well and is in NAD  Neck: General: Supple without adenopathy. Thyroid: Surgical scar   Lungs: Clear with good BS bilat with  no rales, rhonchi, or wheezes  Heart: Auscultation: RRR.  Abdomen: Normoactive bowel sounds, soft, nontender, without masses or organomegaly palpable  Extremities:  BL LE: None pitting  pretibial edema present  Mental Status: Judgment, insight: Intact Orientation: Oriented to time, place, and person Mood and affect: No depression, anxiety, or agitation     DATA REVIEWED: Results for BRANDAN, GLAUBER" (MRN [id number])  as of 12/13/2019 12:19  Ref. Range 12/12/2019 12:08  TSH Latest Ref Range: 0.35 - 4.50 uIU/mL 14.18 (H)       Pathology report 06/17/2019 Procedure: Total thyroidectomy  Tumor Focality: Unifocal  Tumor Site: Left thyroid lobe  Tumor Size: 6.0 cm  Histologic Type: Papillary thyroid carcinoma, follicular variant  Margins: Uninvolved by tumor  Angioinvasion: Not identified  Lymphatic Invasion: Not identified  Extrathyroidal extension: Not identified  Regional Lymph Nodes: No lymph nodes submitted or found  Pathologic Stage Classification (pTNM, AJCC 8th Edition): pT3a, pNx  field    ASSESSMENT / PLAN / RECOMMENDATIONS:   1. Papillary thyroid carcinoma ( pT3a, pNx) , Stage II  - Risk of recurrence is low based on the initial risk stratification - TSH goal for now is 0.1-0.5 uIU/mL  - Tg and Tg Ab 's pending  - S/P RAI ablation 11/2019   2. Post-Operative Hypothyroidism:   - Pt is clinically euthyroid  - She is biochemically hypothyroid, will adjust her LT-4 replacement as below    Medications  Stop Levothyroxine 125 mcg daily  Start Levothyroxine 150 mcg daily      F/u in 6 months    Signed electronically by: Mack Guise, MD  [facility] Endocrinology  [facility] [address] [place],  [address] Phone: [phone] FAX: [phone]      CC: Elby Showers, MD [address] [id number] Phone: [phone]  Fax: [phone]   Return to Endocrinology clinic as  below: Future Appointments  Date Time Provider [place]  12/12/2019 11:30 AM Eufemia Prindle, Melanie Crazier, MD [facility] None  01/02/2020  8:15 AM Jacqualyn Posey Bonna Gains, DPM [facility]

## 2019-12-13 DIAGNOSIS — Z471 Aftercare following joint replacement surgery: Secondary | ICD-10-CM | POA: Diagnosis not present

## 2019-12-13 DIAGNOSIS — Z96652 Presence of left artificial knee joint: Secondary | ICD-10-CM | POA: Diagnosis not present

## 2019-12-13 LAB — THYROGLOBULIN LEVEL: Thyroglobulin: 0.3 ng/mL — ABNORMAL LOW

## 2019-12-13 LAB — THYROGLOBULIN ANTIBODY: Thyroglobulin Ab: 21 IU/mL — ABNORMAL HIGH (ref <=1)

## 2019-12-13 MED ORDER — LEVOTHYROXINE SODIUM 150 MCG PO TABS: 150.0000 ug | ORAL_TABLET | Freq: Every day | ORAL | 1 refills | Status: DC

## 2020-01-02 ENCOUNTER — Other Ambulatory Visit: Payer: Self-pay

## 2020-01-02 ENCOUNTER — Ambulatory Visit (INDEPENDENT_AMBULATORY_CARE_PROVIDER_SITE_OTHER): Payer: Medicare Other | Admitting: Podiatry

## 2020-01-02 DIAGNOSIS — Q828 Other specified congenital malformations of skin: Secondary | ICD-10-CM | POA: Diagnosis not present

## 2020-01-02 DIAGNOSIS — M79676 Pain in unspecified toe(s): Secondary | ICD-10-CM | POA: Diagnosis not present

## 2020-01-02 DIAGNOSIS — B351 Tinea unguium: Secondary | ICD-10-CM

## 2020-01-02 DIAGNOSIS — G629 Polyneuropathy, unspecified: Secondary | ICD-10-CM

## 2020-01-02 DIAGNOSIS — Z89429 Acquired absence of other toe(s), unspecified side: Secondary | ICD-10-CM | POA: Diagnosis not present

## 2020-01-02 NOTE — Progress Notes (Signed)
Patient ID: Cassidy [name], female   DOB: [birth year], 79 y.o.   MRN: [id number]  Subjective: Ms. Peckinpaugh, 79 year old female presents the office today for concerns of thick painful toenails and calluses to both of her feet. She denies any redness or drainage from along the area and has no new concerns today.   No open lesions.   Objective: AAO 3, NAD DP/PT pulses are palpable, CRT less than 3 seconds  Protective sensation absent with Derrel Nip monofilament Hyperkeratotic lesion present right plantar hallux. Hyperkeratotic lesions were also present medial first MTPJ on the left and submetatarsal 5 bilaterally and left submetatarsal 2. After debridement there were no open lesions no underlying ulceration, drainage, or signs of infection. There was some dried blood under the right hallux callus but no open lesions.  Nails are hypertrophic, dystrophic, brittle, discolored 9. There is tenderness palpation to the nails.  No pain with calf compression, swelling, warmth, erythema.  Assessment: 79 year old female with pre-ulcerative calluses bilaterally, symptomatic onychomycosis  Plan: -Treatment options discussed including all alternatives, risks, and complications -Nail sharply debrided 9 without complications/bleeding -Hyperkeratotic lesion sharply debrided 4 without complications -Discussed the importance of daily foot inspection -Follow-up as scheduled or sooner if any problems arise. In the meantime, encouraged to call the office with any questions, concerns, change in symptoms.   Celesta Gentile, DPM

## 2020-02-21 DIAGNOSIS — C73 Malignant neoplasm of thyroid gland: Secondary | ICD-10-CM | POA: Diagnosis not present

## 2020-02-21 DIAGNOSIS — E890 Postprocedural hypothyroidism: Secondary | ICD-10-CM | POA: Diagnosis not present

## 2020-02-28 DIAGNOSIS — L821 Other seborrheic keratosis: Secondary | ICD-10-CM | POA: Diagnosis not present

## 2020-02-28 DIAGNOSIS — D225 Melanocytic nevi of trunk: Secondary | ICD-10-CM | POA: Diagnosis not present

## 2020-02-28 DIAGNOSIS — L814 Other melanin hyperpigmentation: Secondary | ICD-10-CM | POA: Diagnosis not present

## 2020-02-28 DIAGNOSIS — D2372 Other benign neoplasm of skin of left lower limb, including hip: Secondary | ICD-10-CM | POA: Diagnosis not present

## 2020-02-28 DIAGNOSIS — Z86018 Personal history of other benign neoplasm: Secondary | ICD-10-CM | POA: Diagnosis not present

## 2020-02-28 DIAGNOSIS — L578 Other skin changes due to chronic exposure to nonionizing radiation: Secondary | ICD-10-CM | POA: Diagnosis not present

## 2020-02-28 DIAGNOSIS — D485 Neoplasm of uncertain behavior of skin: Secondary | ICD-10-CM | POA: Diagnosis not present

## 2020-02-28 DIAGNOSIS — Z85820 Personal history of malignant melanoma of skin: Secondary | ICD-10-CM | POA: Diagnosis not present

## 2020-02-28 DIAGNOSIS — D2272 Melanocytic nevi of left lower limb, including hip: Secondary | ICD-10-CM | POA: Diagnosis not present

## 2020-03-05 ENCOUNTER — Ambulatory Visit (INDEPENDENT_AMBULATORY_CARE_PROVIDER_SITE_OTHER): Payer: Medicare Other | Admitting: Podiatry

## 2020-03-05 ENCOUNTER — Other Ambulatory Visit: Payer: Self-pay

## 2020-03-05 DIAGNOSIS — M79676 Pain in unspecified toe(s): Secondary | ICD-10-CM

## 2020-03-05 DIAGNOSIS — Q828 Other specified congenital malformations of skin: Secondary | ICD-10-CM | POA: Diagnosis not present

## 2020-03-05 DIAGNOSIS — B351 Tinea unguium: Secondary | ICD-10-CM | POA: Diagnosis not present

## 2020-03-05 DIAGNOSIS — G629 Polyneuropathy, unspecified: Secondary | ICD-10-CM

## 2020-03-05 NOTE — Progress Notes (Signed)
Patient ID: Cassidy [name], female   DOB: August 09, 1940, 79 y.o.   MRN: [id number]  Subjective: [name], 79 year old female presents the office today for concerns of thick painful toenails and calluses to both of her feet. She denies any redness or drainage from along the area and has no new concerns today.   Objective: AAO 3, NAD DP/PT pulses are palpable, CRT less than 3 seconds  Protective sensation absent with Derrel Nip monofilament Hyperkeratotic lesion present right plantar hallux and also present medial first MTPJ on the left and submetatarsal 5 bilaterally and left submetatarsal 2. After debridement there were no open lesions no underlying ulceration, drainage, or signs of infection. There was some dried blood under the right hallux callus but no open lesions.  Nails are hypertrophic, dystrophic, brittle, discolored 9. There is tenderness palpation to the nails.  Cyst on left 4th toe stable. No signs of infection  No pain with calf compression, swelling, warmth, erythema.  Assessment: 79 year old female with pre-ulcerative calluses bilaterally, symptomatic onychomycosis  Plan: -Treatment options discussed including all alternatives, risks, and complications -Nail sharply debrided 9 without complications/bleeding -Hyperkeratotic lesion sharply debrided 5 without complications -Discussed the importance of daily foot inspection -Follow-up as scheduled or sooner if any problems arise. In the meantime, encouraged to call the office with any questions, concerns, change in symptoms.   Celesta Gentile, DPM

## 2020-03-07 IMAGING — [facility] DIGITAL DIAGNOSTIC UNILATERAL LEFT MAMMOGRAM WITH TOMO AND CAD
6 of 10 series · 6 of 30 positions shown · non-contrast
Comparison: Previous exam(s).

CLINICAL DATA: Six-month follow-up for probably benign asymmetry
within the LEFT breast, suspected postsurgical change related to
previous reduction mammoplasty.

EXAM:
DIGITAL DIAGNOSTIC UNILATERAL LEFT MAMMOGRAM WITH CAD AND TOMO

[L MLO synth-2D (1 of 2)]
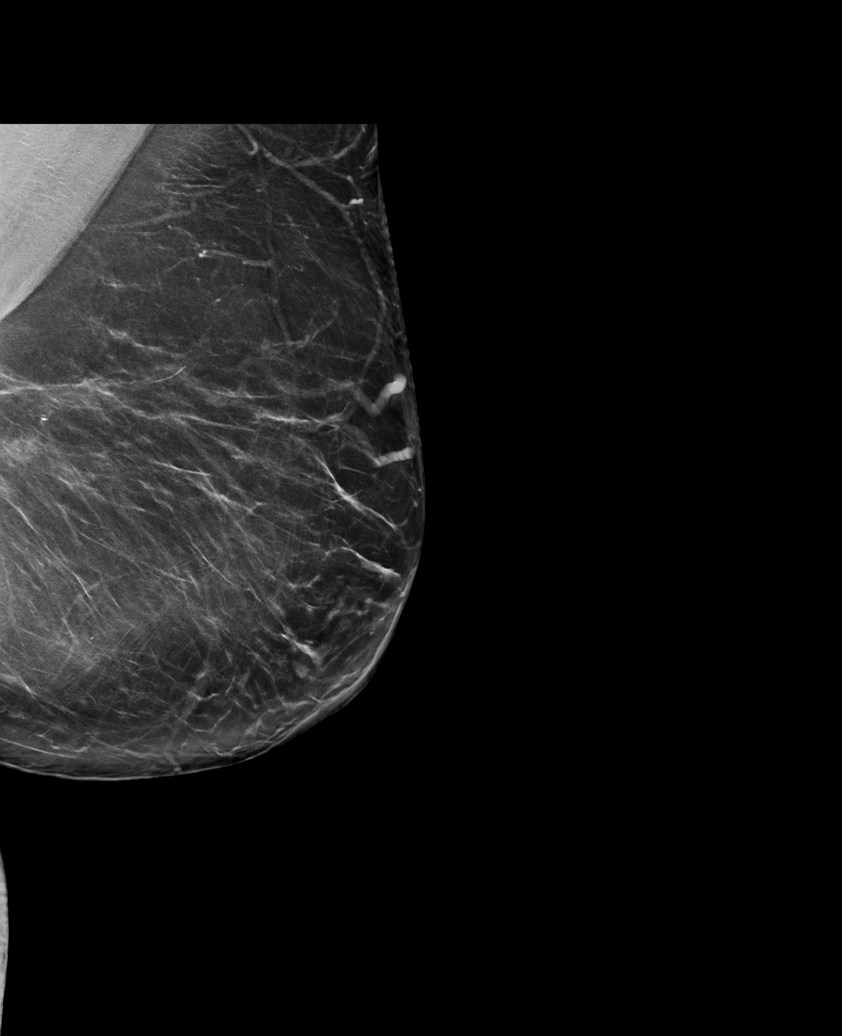

[L CC synth-2D]
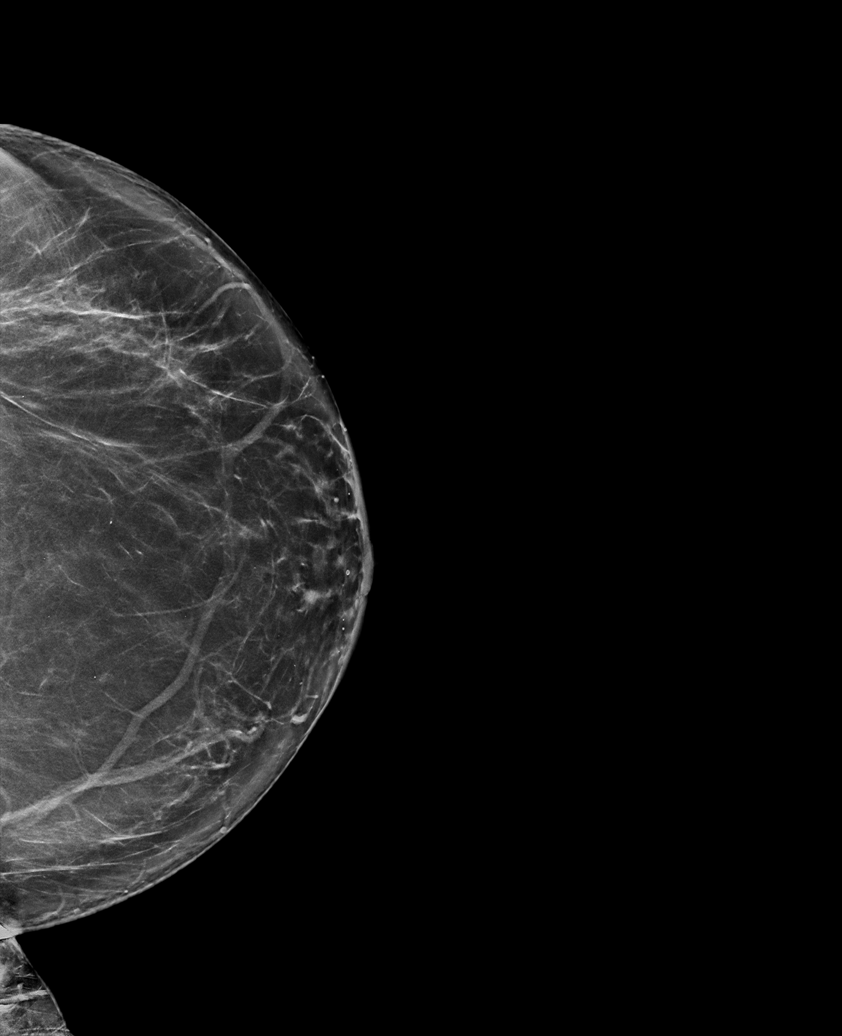

[L MLO synth-2D (2 of 2)]
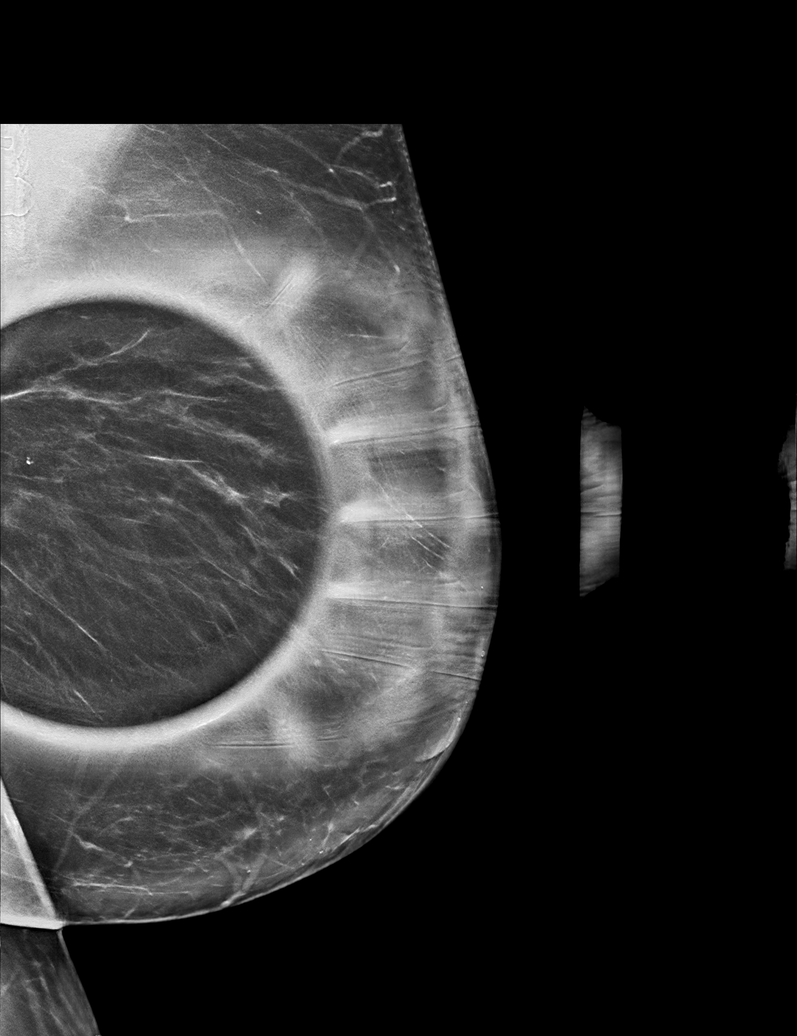

[L XCCL synth-2D]
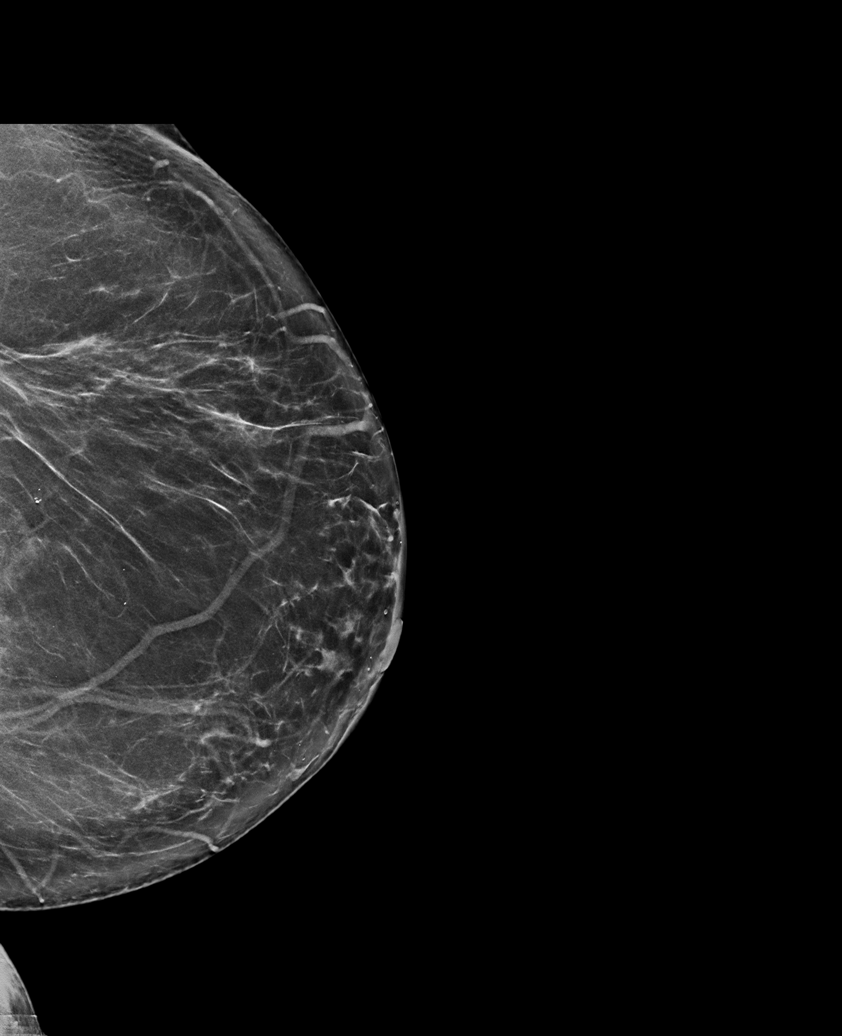

[L ML synth-2D]
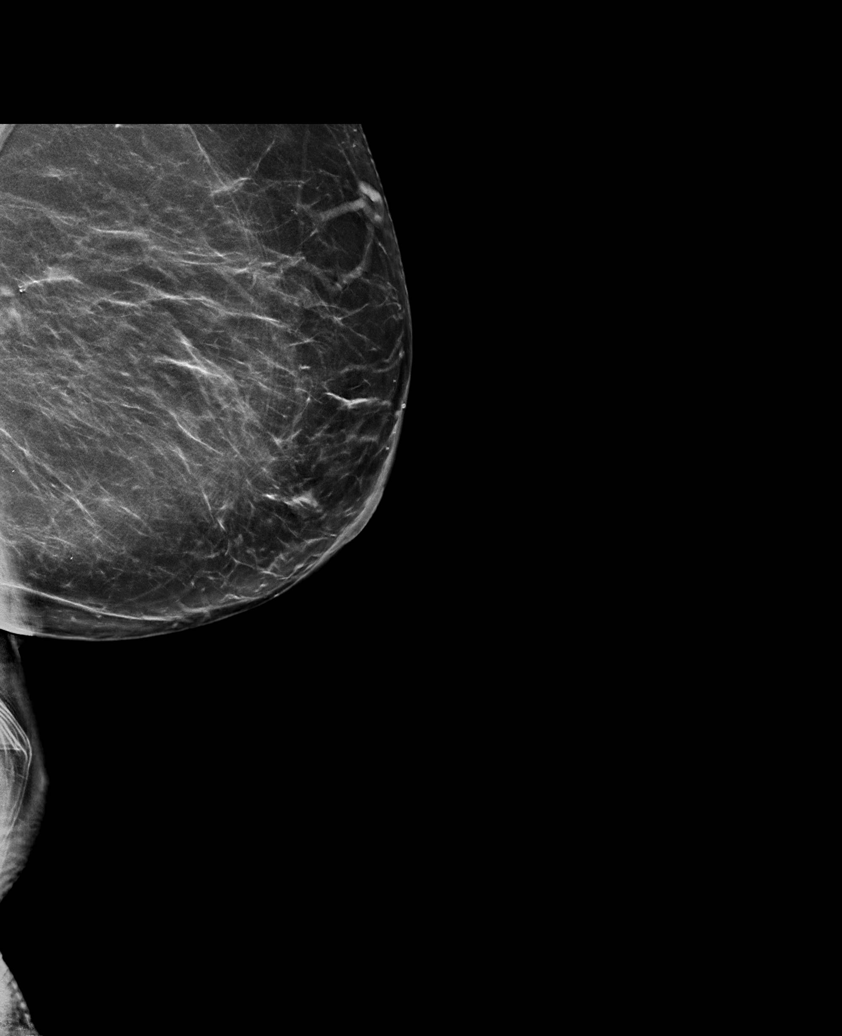

[L XCCL tomo · tomo slice 41/80.0]
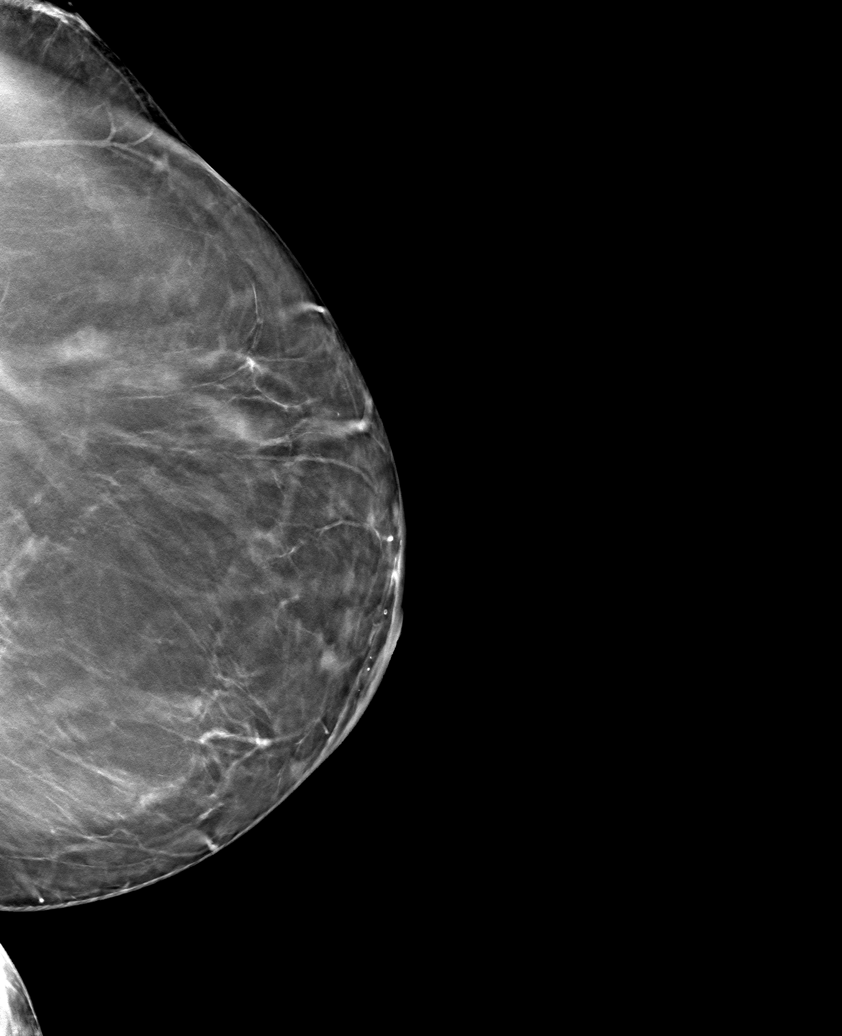

[6 of 30 positions shown; findings below may reference images not displayed]

ACR Breast Density Category b: There are scattered areas of
fibroglandular density.
FINDINGS: The previously described asymmetry in the posterior LEFT breast is
not significantly changed in the interval, again most likely
postsurgical change related to previous reduction mammoplasty. There
are no new dominant masses, suspicious calcifications or secondary
signs of malignancy identified in the LEFT breast on today's exam.

Mammographic images were processed with CAD.
IMPRESSION: Probably benign asymmetry within the posterior LEFT breast, not
significantly changed in the interval, again most likely
postsurgical change related to previous reduction mammoplasty.

Recommend additional follow-up LEFT breast diagnostic mammogram in 6
months to ensure continued stability. This will be performed as a
bilateral diagnostic mammogram in conjunction with patient's routine
annual bilateral screening mammogram schedule.

RECOMMENDATION:
Bilateral diagnostic mammogram in 6 months.

I have discussed the findings and recommendations with the patient.
Results were also provided in writing at the conclusion of the
visit. If applicable, a reminder letter will be sent to the patient
regarding the next appointment.

BI-RADS CATEGORY  3: Probably benign.

## 2020-04-28 ENCOUNTER — Other Ambulatory Visit: Payer: Self-pay | Admitting: Internal Medicine

## 2020-05-07 ENCOUNTER — Ambulatory Visit: Payer: Medicare Other | Admitting: Podiatry

## 2020-05-14 ENCOUNTER — Other Ambulatory Visit: Payer: Medicare Other | Admitting: Internal Medicine

## 2020-05-14 ENCOUNTER — Other Ambulatory Visit: Payer: Self-pay

## 2020-05-14 DIAGNOSIS — I10 Essential (primary) hypertension: Secondary | ICD-10-CM | POA: Diagnosis not present

## 2020-05-14 DIAGNOSIS — Z0000 Encounter for general adult medical examination without abnormal findings: Secondary | ICD-10-CM

## 2020-05-14 DIAGNOSIS — E8881 Metabolic syndrome: Secondary | ICD-10-CM

## 2020-05-14 DIAGNOSIS — M1712 Unilateral primary osteoarthritis, left knee: Secondary | ICD-10-CM

## 2020-05-14 DIAGNOSIS — E0789 Other specified disorders of thyroid: Secondary | ICD-10-CM

## 2020-05-14 DIAGNOSIS — E039 Hypothyroidism, unspecified: Secondary | ICD-10-CM | POA: Diagnosis not present

## 2020-05-14 DIAGNOSIS — E119 Type 2 diabetes mellitus without complications: Secondary | ICD-10-CM | POA: Diagnosis not present

## 2020-05-14 DIAGNOSIS — Z6841 Body Mass Index (BMI) 40.0 and over, adult: Secondary | ICD-10-CM | POA: Diagnosis not present

## 2020-05-15 ENCOUNTER — Other Ambulatory Visit: Payer: Medicare Other | Admitting: Internal Medicine

## 2020-05-15 LAB — CBC WITH DIFFERENTIAL/PLATELET
Absolute Monocytes: 403 cells/uL (ref 200–950)
Basophils Absolute: 78 cells/uL (ref 0–200)
Basophils Relative: 1.4 %
Eosinophils Absolute: 190 cells/uL (ref 15–500)
Eosinophils Relative: 3.4 %
HCT: 42.4 % (ref 35.0–45.0)
Hemoglobin: 13.6 g/dL (ref 11.7–15.5)
Lymphs Abs: 1893 cells/uL (ref 850–3900)
MCH: 29.4 pg (ref 27.0–33.0)
MCHC: 32.1 g/dL (ref 32.0–36.0)
MCV: 91.8 fL (ref 80.0–100.0)
MPV: 10.5 fL (ref 7.5–12.5)
Monocytes Relative: 7.2 %
Neutro Abs: 3035 cells/uL (ref 1500–7800)
Neutrophils Relative %: 54.2 %
Platelets: 230 10*3/uL (ref 140–400)
RBC: 4.62 10*6/uL (ref 3.80–5.10)
RDW: 13.1 % (ref 11.0–15.0)
Total Lymphocyte: 33.8 %
WBC: 5.6 10*3/uL (ref 3.8–10.8)

## 2020-05-15 LAB — COMPLETE METABOLIC PANEL WITH GFR
AG Ratio: 1.5 (calc) (ref 1.0–2.5)
ALT: 17 U/L (ref 6–29)
AST: 17 U/L (ref 10–35)
Albumin: 4.3 g/dL (ref 3.6–5.1)
Alkaline phosphatase (APISO): 95 U/L (ref 37–153)
BUN/Creatinine Ratio: 20 (calc) (ref 6–22)
BUN: 21 mg/dL (ref 7–25)
CO2: 33 mmol/L — ABNORMAL HIGH (ref 20–32)
Calcium: 9.1 mg/dL (ref 8.6–10.4)
Chloride: 103 mmol/L (ref 98–110)
Creat: 1.06 mg/dL — ABNORMAL HIGH (ref 0.60–0.93)
GFR, Est African American: 58 mL/min/{1.73_m2} — ABNORMAL LOW (ref >=60)
GFR, Est Non African American: 50 mL/min/{1.73_m2} — ABNORMAL LOW (ref >=60)
Globulin: 2.8 g/dL (calc) (ref 1.9–3.7)
Glucose, Bld: 91 mg/dL (ref 65–99)
Potassium: 4 mmol/L (ref 3.5–5.3)
Sodium: 142 mmol/L (ref 135–146)
Total Bilirubin: 0.5 mg/dL (ref 0.2–1.2)
Total Protein: 7.1 g/dL (ref 6.1–8.1)

## 2020-05-15 LAB — MICROALBUMIN / CREATININE URINE RATIO
Creatinine, Urine: 88 mg/dL (ref 20–275)
Microalb Creat Ratio: 43 mcg/mg creat — ABNORMAL HIGH (ref <30)
Microalb, Ur: 3.8 mg/dL

## 2020-05-15 LAB — TSH: TSH: 13.91 mIU/L — ABNORMAL HIGH (ref 0.40–4.50)

## 2020-05-15 LAB — HEMOGLOBIN A1C
Hgb A1c MFr Bld: 5.6 % of total Hgb (ref <5.7)
Mean Plasma Glucose: 114 mg/dL
eAG (mmol/L): 6.3 mmol/L

## 2020-05-15 LAB — LIPID PANEL
Cholesterol: 213 mg/dL — ABNORMAL HIGH (ref <200)
HDL: 51 mg/dL (ref >=50)
LDL Cholesterol (Calc): 135 mg/dL (calc) — ABNORMAL HIGH
Non-HDL Cholesterol (Calc): 162 mg/dL (calc) — ABNORMAL HIGH (ref <130)
Total CHOL/HDL Ratio: 4.2 (calc) (ref <5.0)
Triglycerides: 138 mg/dL (ref <150)

## 2020-05-17 ENCOUNTER — Ambulatory Visit (INDEPENDENT_AMBULATORY_CARE_PROVIDER_SITE_OTHER): Payer: Medicare Other | Admitting: Internal Medicine

## 2020-05-17 ENCOUNTER — Encounter: Payer: Self-pay | Admitting: Internal Medicine

## 2020-05-17 ENCOUNTER — Other Ambulatory Visit: Payer: Self-pay

## 2020-05-17 VITALS — BP 140/90 | HR 84 | Ht 64.0 in | Wt 217.0 lb

## 2020-05-17 DIAGNOSIS — R609 Edema, unspecified: Secondary | ICD-10-CM

## 2020-05-17 DIAGNOSIS — Z85820 Personal history of malignant melanoma of skin: Secondary | ICD-10-CM | POA: Diagnosis not present

## 2020-05-17 DIAGNOSIS — I10 Essential (primary) hypertension: Secondary | ICD-10-CM | POA: Diagnosis not present

## 2020-05-17 DIAGNOSIS — Z86711 Personal history of pulmonary embolism: Secondary | ICD-10-CM

## 2020-05-17 DIAGNOSIS — F5109 Other insomnia not due to a substance or known physiological condition: Secondary | ICD-10-CM | POA: Diagnosis not present

## 2020-05-17 DIAGNOSIS — E890 Postprocedural hypothyroidism: Secondary | ICD-10-CM | POA: Diagnosis not present

## 2020-05-17 DIAGNOSIS — Z0000 Encounter for general adult medical examination without abnormal findings: Secondary | ICD-10-CM

## 2020-05-17 DIAGNOSIS — Z6837 Body mass index (BMI) 37.0-37.9, adult: Secondary | ICD-10-CM | POA: Diagnosis not present

## 2020-05-17 DIAGNOSIS — R7989 Other specified abnormal findings of blood chemistry: Secondary | ICD-10-CM | POA: Diagnosis not present

## 2020-05-17 DIAGNOSIS — E119 Type 2 diabetes mellitus without complications: Secondary | ICD-10-CM | POA: Diagnosis not present

## 2020-05-17 MED ORDER — LEVOTHYROXINE SODIUM 175 MCG PO TABS: 175.0000 ug | ORAL_TABLET | Freq: Every day | ORAL | 0 refills | Status: DC

## 2020-05-17 NOTE — Patient Instructions (Addendum)
It was a pleasure to see you today. Increase levothyroxine to 175 mcg daily and follow up with Dr. Kelton Pillar in late February. Has melanoma left leg to be removed at [facility] tomorrow.

## 2020-05-17 NOTE — Progress Notes (Signed)
Subjective:    Patient ID: Cassidy [name], female    DOB: [birth year], 80 y.o.   MRN: [id number]  HPI 80 year old Female for Medicare wellness, health maintenance exam and evaluation of medical issues.  She has a history of hypothyroidism, hypertension, osteopenia, hyperlipidemia, dependent edema, impaired glucose tolerance and obesity.  In 2015 she had melanoma on her right back requiring wide excision.  In 2016 she had melanoma excised from her right foot.  Also at that time she had a dysplastic nevus of her right shoulder for which she underwent a wide excision.  In March 2016 she developed redness and swelling as well as drainage from previously healed wounds of her right foot and ankle.  She was placed on antibiotics and DVT was ruled out.  She was seen at [facility], diagnosed with delayed surgical wound healing and was treated there at the wound care center for several months.  Dr. Carolyn Stare is ophthalmologist and she had cataract extraction of her left eye.  Sees podiatrist regarding dermatophytosis of nails.  Had total thyroidectomy in February 2021 by Dr. Harlow Asa and is followed by Endocrinology.  Was found to have papillary thyroid carcinoma involving the left lobe of thyroid.  Margins were negative for tumor.  Had benign nodules in the right inferior lobe and posterior aspect of the mid left lobe.  Now on levothyroxine 175 mcg daily per Dr. Kelton Pillar.  For hypertension takes amlodipine 5 mg daily.  Takes Lasix 40 mg daily for dependent edema and hypertension.  Takes Xanax if needed at bedtime for anxiety/sleep.  History of left knee arthroplasty by Dr. Mayer Camel in 2020.  Right ganglion cyst surgery in 2008.  Left ankle surgery 2005 involving bone graft.  Arthroscopic surgery for torn ligaments in right knee in 2003.  Ovarian cystectomy 1968, vaginal hysterectomy 1992 for fibroids and bleeding.  Rectocele repair 2009.  Cholecystectomy 2003.  Cardiolite study  done by [facility] April 2009 was normal.  Social history: Divorced.  Resides alone.  Does not smoke.  Social alcohol consumption.  2 adult children, a son and a daughter.  Daughter with history of breast cancer doing well.  3 granddaughters in good health.  Family history: Father died at age 25 of a sudden MI.  Mother died at age 18 of heart problems.  1 brother deceased in an airplane crash at age 65.  2 sisters in good health.    Review of Systems no new complaints     Objective:   Physical Exam Blood pressure 140/90, pulse 84, pulse oximetry 97% weight 217 pounds BMI 37.25 height 5 feet 4 inches  Skin is warm and dry.  No unusual skin lesions noted.  TMs are clear.  Neck is supple without JVD.  No carotid bruits.  Chest is clear.  Cardiac exam: Regular rate and rhythm.  Breast without masses.  Abdomen obese soft nondistended without hepatosplenomegaly masses or tenderness.  Bimanual exam is normal.  No lower extremity pitting edema.  Onychomycosis noted.  Neuro is intact without focal deficits.  Affect thought and judgment are normal.       Assessment & Plan:  Elevated TSH on levothyroxine 150 mcg daily. Increase to 175 mcg daily. Has appt with Dr. Kelton Pillar in late February.  She can follow-up with [place].  History of papillary carcinoma thyroid status post thyroidectomy and is on thyroid replacement therapy chronically  Essential hypertension-stable on current regimen  Had hypothyroidism prior to thyroidectomy  Hyperlipidemia-currently  not on statin.  Total cholesterol is 213 and LDL cholesterol 135.  Would suggest she take low-dose statin.  She has declined.  Dependent edema treated with Lasix  Osteopenia-no recent bone density study  History of impaired glucose tolerance  History of melanoma-has melanoma on left leg which will be removed tomorrow.  BMI 37-Needs to work on diet a bit.  Orthopedic issues prevent much exercise.  History of left knee  arthroplasty  Plan: Hemoglobin A1c is normal at 5.6%.  She has mild elevation of creatinine at 1.06 fasting.  TSH is elevated and levothyroxine will be increased to 175 mcg daily until she can see endocrinologist.  Return in 1 year or as needed.  Continue other medications as previously prescribed.  Watch diet.  Subjective:   Patient presents for Medicare Annual/Subsequent preventive examination.  Review Past Medical/Family/Social: See above  Risk Factors hyperlipidemia-will not take statin Current exercise habits: Sedentary Dietary issues discussed: Low-fat low carbohydrate    Depression Screen  (Note: if answer to either of the following is "Yes", a more complete depression screening is indicated)   Over the past two weeks, have you felt down, depressed or hopeless? No  Over the past two weeks, have you felt little interest or pleasure in doing things? No Have you lost interest or pleasure in daily life? No Do you often feel hopeless? No Do you cry easily over simple problems? No   Activities of Daily Living  In your present state of health, do you have any difficulty performing the following activities?:   Driving? No  Managing money? No  Feeding yourself? No  Getting from bed to chair? No  Climbing a flight of stairs? No  Preparing food and eating?: No  Bathing or showering? No  Getting dressed: No  Getting to the toilet? No  Using the toilet:No  Moving around from place to place: No  In the past year have you fallen or had a near fall?:No  Are you sexually active? No  Do you have more than one partner? No   Hearing Difficulties: No  Do you often ask people to speak up or repeat themselves? No  Do you experience ringing or noises in your ears? No  Do you have difficulty understanding soft or whispered voices? No  Do you feel that you have a problem with memory? No Do you often misplace items? No    Home Safety:  Do you have a smoke alarm at your residence?  Yes Do you have grab bars in the bathroom?  Yes Do you have throw rugs in your house?  No   Cognitive Testing  Alert? Yes Normal Appearance?Yes  Oriented to person? Yes Place? Yes  Time? Yes  Recall of three objects?  Not tested Can perform simple calculations? Yes  Displays appropriate judgment?Yes  Can read the correct time from a watch face?Yes   List the Names of Other Physician/Practitioners you currently use:  See referral list for the physicians patient is currently seeing.  Endocrinologist   Review of Systems: See above   Objective:     General appearance: Appears stated age and  obese  Head: Normocephalic, without obvious abnormality, atraumatic  Eyes: conj clear, EOMi PEERLA  Ears: normal TM's and external ear canals both ears  Nose: Nares normal. Septum midline. Mucosa normal. No drainage or sinus tenderness.  Throat: lips, mucosa, and tongue normal; teeth and gums normal  Neck: no adenopathy, no carotid bruit, no JVD, supple, symmetrical, trachea midline and  thyroid absent  No CVA tenderness.  Lungs: clear to auscultation bilaterally  Breasts: normal appearance, no masses Heart: regular rate and rhythm, S1, S2 normal, no murmur, click, rub or gallop  Abdomen: soft, non-tender; bowel sounds normal; no masses, no organomegaly  Musculoskeletal: ROM normal in all joints, no crepitus, no deformity, Normal muscle strengthen. Back  is symmetric, no curvature. Skin: Skin color, texture, turgor normal. No rashes or lesions  Lymph nodes: Cervical, supraclavicular, and axillary nodes normal.  Neurologic: CN 2 -12 Normal, Normal symmetric reflexes. Normal coordination and gait  Psych: Alert & Oriented x 3, Mood appear stable.    Assessment:    Annual wellness medicare exam   Plan:    During the course of the visit the patient was educated and counseled about appropriate screening and preventive services including:   Has had pneumococcal-23 vaccine in 2019 Last  mammogram on file 2019.    Patient Instructions (the written plan) was given to the patient.  Medicare Attestation  I have personally reviewed:  The patient's medical and social history  Their use of alcohol, tobacco or illicit drugs  Their current medications and supplements  The patient's functional ability including ADLs,fall risks, home safety risks, cognitive, and hearing and visual impairment  Diet and physical activities  Evidence for depression or mood disorders  The patient's weight, height, BMI, and visual acuity have been recorded in the chart. I have made referrals, counseling, and provided education to the patient based on review of the above and I have provided the patient with a written personalized care plan for preventive services.

## 2020-05-18 DIAGNOSIS — L989 Disorder of the skin and subcutaneous tissue, unspecified: Secondary | ICD-10-CM | POA: Diagnosis not present

## 2020-05-18 DIAGNOSIS — D485 Neoplasm of uncertain behavior of skin: Secondary | ICD-10-CM | POA: Diagnosis not present

## 2020-05-22 ENCOUNTER — Ambulatory Visit: Payer: Medicare Other | Admitting: Podiatry

## 2020-06-05 ENCOUNTER — Ambulatory Visit (INDEPENDENT_AMBULATORY_CARE_PROVIDER_SITE_OTHER): Payer: Medicare Other | Admitting: Podiatry

## 2020-06-05 ENCOUNTER — Other Ambulatory Visit: Payer: Self-pay

## 2020-06-05 DIAGNOSIS — M79676 Pain in unspecified toe(s): Secondary | ICD-10-CM

## 2020-06-05 DIAGNOSIS — B351 Tinea unguium: Secondary | ICD-10-CM

## 2020-06-05 DIAGNOSIS — G629 Polyneuropathy, unspecified: Secondary | ICD-10-CM

## 2020-06-05 DIAGNOSIS — Z89429 Acquired absence of other toe(s), unspecified side: Secondary | ICD-10-CM

## 2020-06-05 DIAGNOSIS — Q828 Other specified congenital malformations of skin: Secondary | ICD-10-CM | POA: Diagnosis not present

## 2020-06-06 NOTE — Progress Notes (Signed)
Patient ID: Cassidy [name], female   DOB: [birth year], 80 y.o.   MRN: [id number]  Subjective: Cassidy [name], 80 year old female presents the office today for concerns of thick painful toenails and calluses to both of her feet.  She had to miss her last appointment as she was not feeling well.  She denies any redness or drainage from along the area and has no new concerns today.   Objective: AAO 3, NAD DP/PT pulses are palpable, CRT less than 3 seconds  Protective sensation absent with Derrel Nip monofilament Hyperkeratotic lesion present right plantar hallux which has dried blood present.  Upon debridement there is no underlying ulceration drainage or any signs of infection.  Hyperkeratotic lesions are also present medial first MTPJ on the left and submetatarsal 5 bilaterally and left submetatarsal 2. After debridement there were no open lesions no underlying ulceration, drainage, or signs of infection. There was some dried blood under the right hallux callus but no open lesions.  Nails are hypertrophic, dystrophic, brittle, discolored 9. There is tenderness palpation to the nails.  Cyst on left 4th toe stable. No signs of infection  No pain with calf compression, swelling, warmth, erythema.  Assessment: 80 year old female with pre-ulcerative calluses bilaterally, symptomatic onychomycosis  Plan: -Treatment options discussed including all alternatives, risks, and complications -Nail sharply debrided 9 without complications/bleeding -Hyperkeratotic lesion sharply debrided 5 without complications-monitor for any further skin breakdown particular right hallux with the callus appeared to be thicker and had dried blood but there is no open lesion identified today.  Continue offloading. -Discussed the importance of daily foot inspection -Follow-up as scheduled or sooner if any problems arise. In the meantime, encouraged to call the office with any questions, concerns, change in symptoms.    Celesta Gentile, DPM

## 2020-06-12 ENCOUNTER — Other Ambulatory Visit: Payer: Self-pay

## 2020-06-14 ENCOUNTER — Other Ambulatory Visit: Payer: Self-pay

## 2020-06-14 ENCOUNTER — Encounter: Payer: Self-pay | Admitting: Internal Medicine

## 2020-06-14 ENCOUNTER — Ambulatory Visit (INDEPENDENT_AMBULATORY_CARE_PROVIDER_SITE_OTHER): Payer: Medicare Other | Admitting: Internal Medicine

## 2020-06-14 VITALS — BP 140/82 | HR 88 | Ht 64.0 in | Wt 217.2 lb

## 2020-06-14 DIAGNOSIS — E890 Postprocedural hypothyroidism: Secondary | ICD-10-CM

## 2020-06-14 DIAGNOSIS — C73 Malignant neoplasm of thyroid gland: Secondary | ICD-10-CM | POA: Diagnosis not present

## 2020-06-14 NOTE — Patient Instructions (Signed)

## 2020-06-14 NOTE — Progress Notes (Unsigned)
Name: Cassidy [name]  MRN/ DOB: [id number], [birth year]    Age/ Sex: 80 y.o., female     PCP: Elby Showers, MD   Reason for Endocrinology Evaluation: [facility]     Initial Endocrinology Clinic Visit: 04/05/2020    PATIENT IDENTIFIER: Cassidy [name] is a 80 y.o., female with a past medical history of HTN, Hx of DVT's and melanoma . She has followed with [facility] since 04/05/2020  for consultative assistance with management of her MNG.      HISTORICAL SUMMARY:  Pt was noted to have a tracheal deviation on CXR during pre-op evaluation. A CT scan showed a left thyroid necrotic mass that measures ~ 6.5 cm. 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   Afirma came back benign for the 2.6 cm nodule but no afirma results  for the 5.9 nodule due to low follicular content.  She is S/P total thyroidectomy on 06/17/2019 with a pathology report of 6.0 cm PTC   Of note she has been on LT- 4 replacement for years   A Thyrogen WBS 09/2019 showed Focal activity in thyroid bed consistent with remnant thyroid tissue and potential metastatic lymph node. Follow up thyroid bed ultrasound was unrevealing.    She is S/P RAI 53.5 mCi I-131  On 11/30/2019  Post-Op WBS scan demonstrated a Focus of increased tracer uptake at LEFT thyroid bed consistent with thyroid remnant and a  less intense focus of uptake cranial to the more intense focus at the LEFT thyroid bed, question additional thyroid remnant versus regional metastatic lymph node.    SUBJECTIVE:    Today (06/14/2020):  Cassidy [name] is here for a follow up on total thyroidectomy and a PTC diagnosis.    Weight has been stable  Denies palpitations  Has  occasional loose stools  No local neck symptoms    TSH was elevated at PCP's office and levothyroxine increased from 150 to 175 mcg daily       HISTORY:  Past Medical History:  Past Medical History:  Diagnosis Date  . Anemia    with pregnancy  . Anxiety   . Arthritis   . Cancer ([place])    right foot,top of arm, lympth nodes removed  . Dependent edema   . DVT (deep venous thrombosis) ([place])   . Fatty liver   . Heart murmur    occ  . HTN (hypertension), benign   . Hyperlipidemia   . Hypothyroidism   . Lymphedema   . Melanoma ([place])   . Migraine 08/27/2010  . Osteopenia   . PE (pulmonary embolism) 2010   bilateral hx post op knee replacement  . PONV (postoperative nausea and vomiting)    Past Surgical History:  Past Surgical History:  Procedure Laterality Date  . ABDOMINAL HYSTERECTOMY     age 80  . AMPUTATION  11/10/2011   Procedure: AMPUTATION RAY;  Surgeon: Kerin Salen, MD;  Location: [facility];  Service: Orthopedics;  Laterality: Left;  LEFT 2ND TOE TRANS MIDDLE PHALANX AMPUTATION   . ANKLE FUSION  2005   lt-fusion  . APPENDECTOMY    . CHOLECYSTECTOMY  02/14/2002  . COLONOSCOPY    . GANGLION CYST EXCISION  2008   Rt wrist  .  JOINT REPLACEMENT  2010   rt total knee  . KNEE ARTHROSCOPY  6/03   Rt knee  . OVARIAN CYST SURGERY    . RECTOCELE REPAIR  2009  . REDUCTION MAMMAPLASTY Bilateral   . THYROIDECTOMY N/A 06/17/2019   Procedure: TOTAL THYROIDECTOMY;  Surgeon: Armandina Gemma, MD;  Location: [facility];  Service: General;  Laterality: N/A;  . TOTAL KNEE ARTHROPLASTY Left 12/06/2018   Procedure: Left Knee Arthroplasty;  Surgeon: Frederik Pear, MD;  Location: [facility];  Service: Orthopedics;  Laterality: Left;  . UPPER GI ENDOSCOPY      Social History:  reports that she has never smoked. She has never used smokeless tobacco. She reports that she does not drink alcohol and does not use drugs. Family History:  Family History  Problem Relation Age of  Onset  . Heart disease Mother   . Heart disease Father   . Diabetes Brother   . Heart disease Brother   . Breast cancer Daughter 47     HOME MEDICATIONS: Allergies as of 06/14/2020   No Known Allergies     Medication List       Accurate as of June 14, 2020  1:42 PM. If you have any questions, ask your nurse or doctor.        ALPRAZolam 0.25 MG tablet Commonly known as: XANAX Take 1 tablet (0.25 mg total) by mouth at bedtime as needed for anxiety.   amLODipine 5 MG tablet Commonly known as: NORVASC TAKE ONE TABLET DAILY   apixaban 2.5 MG Tabs tablet Commonly known as: Eliquis Take 1 tablet (2.5 mg total) by mouth 2 (two) times daily.   APPLE CIDER VINEGAR PO Take 1 tablet by mouth daily.   aspirin EC 81 MG tablet Take 81 mg by mouth daily.   calcium carbonate 500 MG chewable tablet Commonly known as: Tums Chew 2 tablets (400 mg of elemental calcium total) by mouth 2 (two) times daily.   furosemide 40 MG tablet Commonly known as: LASIX TAKE ONE TABLET DAILY   levothyroxine 175 MCG tablet Commonly known as: SYNTHROID Take 1 tablet (175 mcg total) by mouth daily before breakfast.   multivitamin with minerals Tabs tablet Take 1 tablet by mouth daily with lunch.   oxyCODONE-acetaminophen 5-325 MG tablet Commonly known as: PERCOCET/ROXICET Take 1 tablet by mouth every 4 (four) hours as needed for severe pain.   tiZANidine 2 MG tablet Commonly known as: ZANAFLEX Take 1 tablet (2 mg total) by mouth every 6 (six) hours as needed.   traMADol 50 MG tablet Commonly known as: ULTRAM Take 1-2 tablets (50-100 mg total) by mouth every 6 (six) hours as needed.         OBJECTIVE:   PHYSICAL EXAM: VS: BP 140/82   Pulse 88   Ht _0  (1.626 m)   Wt 217 lb 4 oz (98.5 kg)   SpO2 94%   BMI 37.29 kg/m    EXAM: General: Pt appears well and is in NAD  Neck: General: Supple without adenopathy. Thyroid: Surgical scar   Lungs: Clear with good BS bilat with  no rales, rhonchi, or wheezes  Heart: Auscultation: RRR.  Abdomen: Normoactive bowel sounds, soft, nontender, without masses or organomegaly palpable  Extremities:  BL LE: None pitting  pretibial edema present  Mental Status: Judgment, insight: Intact Orientation: Oriented to time, place, and person Mood and affect: No depression, anxiety, or agitation     DATA REVIEWED: Results for KELCIE, CURRIE" (MRN [id number]) as  of 06/15/2020 11:04  Ref. Range 05/14/2020 11:09 06/14/2020 10:57  TSH Latest Ref Range: 0.450 - 4.500 uIU/mL 13.91 (H) 11.000 (H)     Pathology report 06/17/2019 Procedure: Total thyroidectomy  Tumor Focality: Unifocal  Tumor Site: Left thyroid lobe  Tumor Size: 6.0 cm  Histologic Type: Papillary thyroid carcinoma, follicular variant  Margins: Uninvolved by tumor  Angioinvasion: Not identified  Lymphatic Invasion: Not identified  Extrathyroidal extension: Not identified  Regional Lymph Nodes: No lymph nodes submitted or found  Pathologic Stage Classification (pTNM, AJCC 8th Edition): pT3a, pNx  field    ASSESSMENT / PLAN / RECOMMENDATIONS:   1. Papillary thyroid carcinoma ( pT3a, pNx) , Stage II  - S/P RAI ablation 11/2019  - TSH goal for now is 0.1-0.5 uIU/mL  - Tg pending and Tg Ab trending down  - Will proceed with thyroid bed ultrasound  - So far with excellent biochemical and structural response to therapy     2. Post-Operative Hypothyroidism:   - Pt is clinically euthyroid  - Unfortunately she continues with high TSH despite gradual escalation of LT-4 replacement.  - Will adjust as below   Medications    Levothyroxine 175 mcg , TWO on Saturday and Sunday and 1 tablet rest of the week      F/u in 6 months  Labs in 6 weeks   Signed electronically by: Mack Guise, MD  [facility]  [facility] [address] [place], [place] [address] Phone: [phone] FAX: [phone]       CC: Elby Showers, MD [address] [place] [id number] Phone: [phone]  Fax: [phone]   Return to Endocrinology clinic as below: Future Appointments  Date Time Provider [place]  07/30/2020  9:15 AM [facility] None  08/07/2020  8:15 AM Trula Slade, DPM [facility]  12/17/2020  7:30 AM Shamleffer, Melanie Crazier, MD [facility] None

## 2020-06-15 MED ORDER — LEVOTHYROXINE SODIUM 175 MCG PO TABS
175.0000 ug | ORAL_TABLET | ORAL | 6 refills | Status: DC
Start: 2020-06-15 — End: 2020-07-31

## 2020-06-20 LAB — TGAB+THYROGLOBULIN IMA OR LCMS: Thyroglobulin Antibody: 7.5 IU/mL — ABNORMAL HIGH (ref 0.0–0.9)

## 2020-06-20 LAB — TSH: TSH: 11 u[IU]/mL — ABNORMAL HIGH (ref 0.450–4.500)

## 2020-06-20 LAB — THYROGLOBULIN BY LCMS: Thyroglobulin by LCMS: <0.2 ng/mL — ABNORMAL LOW (ref 1.5–38.5)

## 2020-07-03 ENCOUNTER — Ambulatory Visit
Admission: RE | Admit: 2020-07-03 | Discharge: 2020-07-03 | Disposition: A | Discharge status: Home / Self Care | Payer: Medicare Other | Source: Ambulatory Visit | Attending: Internal Medicine | Admitting: Internal Medicine

## 2020-07-03 DIAGNOSIS — C73 Malignant neoplasm of thyroid gland: Secondary | ICD-10-CM | POA: Diagnosis not present

## 2020-07-03 DIAGNOSIS — E890 Postprocedural hypothyroidism: Secondary | ICD-10-CM | POA: Diagnosis not present

## 2020-07-30 ENCOUNTER — Other Ambulatory Visit (INDEPENDENT_AMBULATORY_CARE_PROVIDER_SITE_OTHER): Payer: Medicare Other

## 2020-07-30 ENCOUNTER — Other Ambulatory Visit: Payer: Self-pay

## 2020-07-30 DIAGNOSIS — C73 Malignant neoplasm of thyroid gland: Secondary | ICD-10-CM | POA: Diagnosis not present

## 2020-07-30 LAB — TSH: TSH: 4.13 u[IU]/mL (ref 0.35–4.50)

## 2020-07-31 MED ORDER — LEVOTHYROXINE SODIUM 200 MCG PO TABS: 200.0000 ug | ORAL_TABLET | ORAL | 3 refills | Status: DC

## 2020-08-07 ENCOUNTER — Ambulatory Visit: Payer: Medicare Other | Admitting: Podiatry

## 2020-08-23 ENCOUNTER — Ambulatory Visit: Payer: Medicare Other | Admitting: Podiatry

## 2020-08-28 DIAGNOSIS — L821 Other seborrheic keratosis: Secondary | ICD-10-CM | POA: Diagnosis not present

## 2020-08-28 DIAGNOSIS — Z86018 Personal history of other benign neoplasm: Secondary | ICD-10-CM | POA: Diagnosis not present

## 2020-08-28 DIAGNOSIS — D0372 Melanoma in situ of left lower limb, including hip: Secondary | ICD-10-CM | POA: Diagnosis not present

## 2020-08-28 DIAGNOSIS — L814 Other melanin hyperpigmentation: Secondary | ICD-10-CM | POA: Diagnosis not present

## 2020-08-28 DIAGNOSIS — D485 Neoplasm of uncertain behavior of skin: Secondary | ICD-10-CM | POA: Diagnosis not present

## 2020-08-28 DIAGNOSIS — L578 Other skin changes due to chronic exposure to nonionizing radiation: Secondary | ICD-10-CM | POA: Diagnosis not present

## 2020-08-28 DIAGNOSIS — Z85820 Personal history of malignant melanoma of skin: Secondary | ICD-10-CM | POA: Diagnosis not present

## 2020-08-28 DIAGNOSIS — D225 Melanocytic nevi of trunk: Secondary | ICD-10-CM | POA: Diagnosis not present

## 2020-09-03 ENCOUNTER — Encounter: Payer: Self-pay | Admitting: Podiatry

## 2020-09-03 ENCOUNTER — Other Ambulatory Visit: Payer: Self-pay

## 2020-09-03 ENCOUNTER — Ambulatory Visit (INDEPENDENT_AMBULATORY_CARE_PROVIDER_SITE_OTHER): Payer: Medicare Other | Admitting: Podiatry

## 2020-09-03 DIAGNOSIS — Z89429 Acquired absence of other toe(s), unspecified side: Secondary | ICD-10-CM | POA: Diagnosis not present

## 2020-09-03 DIAGNOSIS — G629 Polyneuropathy, unspecified: Secondary | ICD-10-CM

## 2020-09-03 DIAGNOSIS — M79676 Pain in unspecified toe(s): Secondary | ICD-10-CM

## 2020-09-03 DIAGNOSIS — B351 Tinea unguium: Secondary | ICD-10-CM

## 2020-09-03 DIAGNOSIS — Q828 Other specified congenital malformations of skin: Secondary | ICD-10-CM

## 2020-09-03 NOTE — Progress Notes (Signed)
Patient ID: Cassidy [name], female   DOB: 08-Sep-1940, 80 y.o.   MRN: [id number]  Subjective: Cassidy [name], 80 year old female presents the office today for concerns of thick painful toenails and calluses to both of her feet. No open sores she reports and no new concerns.  She denies any redness or drainage from along the area and has no new concerns today.   Objective: AAO 3, NAD DP/PT pulses are palpable, CRT less than 3 seconds  Protective sensation absent with Derrel Nip monofilament Hyperkeratotic lesion present right plantar hallux with dried blood. Hyperkeratoic lesions also present medial 1st MTPJ left and submetatarsal 5 b/l, left submet 2.  Upon debridement there is no underlying ulceration drainage or any signs of infection.   Nails are hypertrophic, dystrophic, brittle, discolored 9. There is tenderness palpation to the nails.  Cyst on left 4th toe stable. No signs of infection  No pain with calf compression, swelling, warmth, erythema.  Assessment: 80 year old female with pre-ulcerative calluses bilaterally, symptomatic onychomycosis  Plan: -Treatment options discussed including all alternatives, risks, and complications -Nail sharply debrided 9.  Upon debridement of the right hallux swelling bleeding occurred.  Areas cleaned and antibiotic ointment and dressing applied.  Monitoring signs or symptoms of infection -Hyperkeratotic lesion sharply debrided 5 without complications. Monitor for any skin breakdown. Continue offloading. Moisturizer  -Discussed the importance of daily foot inspection -Follow-up as scheduled or sooner if any problems arise. In the meantime, encouraged to call the office with any questions, concerns, change in symptoms.   Celesta Gentile, DPM

## 2020-10-11 DIAGNOSIS — L905 Scar conditions and fibrosis of skin: Secondary | ICD-10-CM | POA: Diagnosis not present

## 2020-10-11 DIAGNOSIS — D0372 Melanoma in situ of left lower limb, including hip: Secondary | ICD-10-CM | POA: Diagnosis not present

## 2020-11-12 ENCOUNTER — Other Ambulatory Visit: Payer: Self-pay

## 2020-11-12 ENCOUNTER — Ambulatory Visit (INDEPENDENT_AMBULATORY_CARE_PROVIDER_SITE_OTHER): Payer: Medicare Other | Admitting: Podiatry

## 2020-11-12 ENCOUNTER — Encounter: Payer: Self-pay | Admitting: Podiatry

## 2020-11-12 DIAGNOSIS — G629 Polyneuropathy, unspecified: Secondary | ICD-10-CM

## 2020-11-12 DIAGNOSIS — D1801 Hemangioma of skin and subcutaneous tissue: Secondary | ICD-10-CM | POA: Insufficient documentation

## 2020-11-12 DIAGNOSIS — Z85820 Personal history of malignant melanoma of skin: Secondary | ICD-10-CM | POA: Insufficient documentation

## 2020-11-12 DIAGNOSIS — Q828 Other specified congenital malformations of skin: Secondary | ICD-10-CM | POA: Diagnosis not present

## 2020-11-12 DIAGNOSIS — L570 Actinic keratosis: Secondary | ICD-10-CM | POA: Insufficient documentation

## 2020-11-12 DIAGNOSIS — B351 Tinea unguium: Secondary | ICD-10-CM | POA: Diagnosis not present

## 2020-11-12 DIAGNOSIS — M79676 Pain in unspecified toe(s): Secondary | ICD-10-CM | POA: Diagnosis not present

## 2020-11-12 DIAGNOSIS — D485 Neoplasm of uncertain behavior of skin: Secondary | ICD-10-CM | POA: Insufficient documentation

## 2020-11-12 DIAGNOSIS — L814 Other melanin hyperpigmentation: Secondary | ICD-10-CM | POA: Insufficient documentation

## 2020-11-12 DIAGNOSIS — Z87898 Personal history of other specified conditions: Secondary | ICD-10-CM | POA: Insufficient documentation

## 2020-11-12 DIAGNOSIS — Z89429 Acquired absence of other toe(s), unspecified side: Secondary | ICD-10-CM | POA: Diagnosis not present

## 2020-11-12 DIAGNOSIS — D0372 Melanoma in situ of left lower limb, including hip: Secondary | ICD-10-CM | POA: Insufficient documentation

## 2020-11-12 DIAGNOSIS — D225 Melanocytic nevi of trunk: Secondary | ICD-10-CM | POA: Insufficient documentation

## 2020-11-15 NOTE — Progress Notes (Signed)
Patient ID: [name], female   DOB: May 20, 1940, 80 y.o.   MRN: [id number]  Subjective: Ms. Saragosa, 80 year old female presents the office today for concerns of thick painful toenails and calluses to both of her feet. No open sores she reports and no new concerns.  She denies any redness or drainage from along the area and has no new concerns today.   Objective: AAO 3, NAD DP/PT pulses are palpable, CRT less than 3 seconds  Protective sensation absent with Derrel Nip monofilament Hyperkeratotic lesion present right plantar hallux with dried blood. Hyperkeratoic lesions also present medial 1st MTPJ left and submetatarsal 5 b/l, left submet 2.  Upon debridement there is no underlying ulceration drainage or any signs of infection.   Nails are hypertrophic, dystrophic, brittle, discolored 9. There is tenderness palpation to the nails.  No pain with calf compression, swelling, warmth, erythema.  Assessment: 80 year old female with pre-ulcerative calluses bilaterally, symptomatic onychomycosis  Plan: -Treatment options discussed including all alternatives, risks, and complications -Nail sharply debrided 9.  Upon debridement of the right hallux swelling bleeding occurred.  Areas cleaned and antibiotic ointment and dressing applied.  Monitoring signs or symptoms of infection -Hyperkeratotic lesion sharply debrided 5 without complications. Monitor for any skin breakdown. Continue offloading. Moisturizer  -Discussed the importance of daily foot inspection -Follow-up as scheduled or sooner if any problems arise. In the meantime, encouraged to call the office with any questions, concerns, change in symptoms.   Celesta Gentile, DPM

## 2020-12-17 ENCOUNTER — Ambulatory Visit (INDEPENDENT_AMBULATORY_CARE_PROVIDER_SITE_OTHER): Payer: Medicare Other | Admitting: Internal Medicine

## 2020-12-17 ENCOUNTER — Other Ambulatory Visit: Payer: Self-pay

## 2020-12-17 ENCOUNTER — Encounter: Payer: Self-pay | Admitting: Internal Medicine

## 2020-12-17 ENCOUNTER — Ambulatory Visit: Payer: Medicare Other | Admitting: Internal Medicine

## 2020-12-17 VITALS — BP 128/84 | HR 92 | Ht 64.0 in | Wt 217.6 lb

## 2020-12-17 DIAGNOSIS — C73 Malignant neoplasm of thyroid gland: Secondary | ICD-10-CM

## 2020-12-17 DIAGNOSIS — E890 Postprocedural hypothyroidism: Secondary | ICD-10-CM | POA: Diagnosis not present

## 2020-12-17 LAB — TSH: TSH: 0.4 u[IU]/mL (ref 0.35–5.50)

## 2020-12-17 NOTE — Progress Notes (Signed)
 Name: Cassidy [name]  MRN/ DOB: [id number], 06/29/1940    Age/ Sex: 80 y.o., female     PCP: Baxley, Mary J, MD   Reason for Endocrinology Evaluation: PTC     Initial Endocrinology Clinic Visit: 04/05/2020    PATIENT IDENTIFIER: Cassidy [name] is a 80 y.o., female with a past medical history of HTN, Hx of DVT's and melanoma . She has followed with [facility] since 04/05/2020  for consultative assistance with management of her MNG.      HISTORICAL SUMMARY:  Pt was noted to have a tracheal deviation on CXR during pre-op evaluation. A CT scan showed a left thyroid necrotic mass that measures ~ 6.5 cm. 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   Afirma came back benign for the 2.6 cm nodule but no afirma results  for the 5.9 nodule due to low follicular content.  She is S/P total thyroidectomy on 06/17/2019 with a pathology report of 6.0 cm PTC   Of note she has been on LT- 4 replacement for years   A Thyrogen WBS 09/2019 showed Focal activity in thyroid bed consistent with remnant thyroid tissue and potential metastatic lymph node. Follow up thyroid bed ultrasound was unrevealing.    She is S/P RAI 53.5 mCi I-131  On 11/30/2019  Post-Op WBS scan demonstrated a Focus of increased tracer uptake at LEFT thyroid bed consistent with thyroid remnant and a  less intense focus of uptake cranial to the more intense focus at the LEFT thyroid bed, question additional thyroid remnant versus regional metastatic lymph node.    SUBJECTIVE:    Today (12/17/2020):  Cassidy [name] is here for a follow up on total thyroidectomy and a PTC diagnosis.    Weight has been stable  She has noted longer  sleeping hours  Denies palpitations  She has been eating better and improved bowel movements  No local neck symptoms   Levothyroxine 200 mcg, TWO tabs on sundays and 1 tablet the rest of the week     HISTORY:  Past Medical History:  Past Medical History:  Diagnosis Date   Anemia    with pregnancy   Anxiety    Arthritis    Cancer (HCC)    right foot,top of arm, lympth nodes removed   Dependent edema    DVT (deep venous thrombosis) (HCC)    Fatty liver    Heart murmur    occ   HTN (hypertension), benign    Hyperlipidemia    Hypothyroidism    Lymphedema    Melanoma (HCC)    Migraine 08/27/2010   Osteopenia    PE (pulmonary embolism) 2010   bilateral hx post op knee replacement   PONV (postoperative nausea and vomiting)    Past Surgical History:  Past Surgical History:  Procedure Laterality Date   ABDOMINAL HYSTERECTOMY     age 38   AMPUTATION  11/10/2011   Procedure: AMPUTATION RAY;  Surgeon: Frank J Rowan, MD;  Location: [facility];  Service: Orthopedics;  Laterality: Left;  LEFT 2ND TOE TRANS MIDDLE PHALANX AMPUTATION    ANKLE FUSION  2005   lt-fusion   APPENDECTOMY     CHOLECYSTECTOMY  02/14/2002   COLONOSCOPY       GANGLION CYST EXCISION  2008   Rt wrist   JOINT REPLACEMENT  2010   rt total knee   KNEE ARTHROSCOPY  6/03   Rt knee   OVARIAN CYST SURGERY     RECTOCELE REPAIR  2009   REDUCTION MAMMAPLASTY Bilateral    THYROIDECTOMY N/A 06/17/2019   Procedure: TOTAL THYROIDECTOMY;  Surgeon: Armandina Gemma, MD;  Location: [facility];  Service: General;  Laterality: N/A;   TOTAL KNEE ARTHROPLASTY Left 12/06/2018   Procedure: Left Knee Arthroplasty;  Surgeon: Frederik Pear, MD;  Location: [facility];  Service: Orthopedics;  Laterality: Left;   UPPER GI ENDOSCOPY     Social History:  reports that she has never smoked. She has never used smokeless tobacco. She reports that she does not drink alcohol and does not use drugs. Family History:  Family History  Problem  Relation Age of Onset   Heart disease Mother    Heart disease Father    Diabetes Brother    Heart disease Brother    Breast cancer Daughter 56     HOME MEDICATIONS: Allergies as of 12/17/2020   No Known Allergies      Medication List        Accurate as of December 17, 2020  8:43 AM. If you have any questions, ask your nurse or doctor.          ALPRAZolam 0.25 MG tablet Commonly known as: XANAX Take 1 tablet (0.25 mg total) by mouth at bedtime as needed for anxiety.   amLODipine 5 MG tablet Commonly known as: NORVASC TAKE ONE TABLET DAILY   apixaban 2.5 MG Tabs tablet Commonly known as: Eliquis Take 1 tablet (2.5 mg total) by mouth 2 (two) times daily.   APPLE CIDER VINEGAR PO Take 1 tablet by mouth daily.   aspirin EC 81 MG tablet Take 81 mg by mouth daily.   calcium carbonate 500 MG chewable tablet Commonly known as: Tums Chew 2 tablets (400 mg of elemental calcium total) by mouth 2 (two) times daily.   furosemide 40 MG tablet Commonly known as: LASIX TAKE ONE TABLET DAILY   levothyroxine 200 MCG tablet Commonly known as: SYNTHROID Take 1 tablet (200 mcg total) by mouth as directed. Two tablets on Sundays and 1 tablet the rest of the week   multivitamin with minerals Tabs tablet Take 1 tablet by mouth daily with lunch.   oxyCODONE-acetaminophen 5-325 MG tablet Commonly known as: PERCOCET/ROXICET Take 1 tablet by mouth every 4 (four) hours as needed for severe pain.   tiZANidine 2 MG tablet Commonly known as: ZANAFLEX Take 1 tablet (2 mg total) by mouth every 6 (six) hours as needed.   traMADol 50 MG tablet Commonly known as: ULTRAM Take 1-2 tablets (50-100 mg total) by mouth every 6 (six) hours as needed.          OBJECTIVE:   PHYSICAL EXAM: VS: BP 128/84 (BP Location: Right Arm, Patient Position: Sitting, Cuff Size: Large)   Pulse 92   Ht 5' 4" (1.626 m)   Wt 217 lb 9.6 oz (98.7 kg)   SpO2 96%   BMI 37.35 kg/m    EXAM: General:  Pt appears well and is in NAD  Neck: General: Supple without adenopathy. Thyroid: Surgical scar   Lungs: Clear with good BS bilat with no rales, rhonchi, or wheezes  Heart: Auscultation: RRR.  Abdomen: Normoactive bowel sounds, soft, nontender, without masses or organomegaly palpable  Extremities:  BL LE: None pitting  pretibial edema present  Mental Status: Judgment, insight: Intact Orientation: Oriented to time, place, and person Mood and affect: No depression, anxiety, or agitation     DATA REVIEWED:  Results for SAMAMTHA, TIEGS" (MRN [id number]) as of 12/19/2020 09:34  Ref. Range 12/17/2020 09:54  TSH Latest Ref Range: 0.35 - 5.50 uIU/mL 0.40  Thyroglobulin Antibody Latest Ref Range: 0.0 - 0.9 IU/mL 3.0 (H)     Pathology report 06/17/2019 Procedure: Total thyroidectomy  Tumor Focality: Unifocal  Tumor Site: Left thyroid lobe  Tumor Size: 6.0 cm  Histologic Type: Papillary thyroid carcinoma, follicular variant  Margins: Uninvolved by tumor  Angioinvasion: Not identified  Lymphatic Invasion: Not identified  Extrathyroidal extension: Not identified  Regional Lymph Nodes: No lymph nodes submitted or found  Pathologic Stage Classification (pTNM, AJCC 8th Edition): pT3a, pNx  field    WBS 12/09/19  Focus of increased tracer uptake at LEFT thyroid bed consistent with thyroid remnant.   Additional less intense focus of uptake cranial to the more intense focus at the LEFT thyroid bed, question additional thyroid remnant versus regional metastatic lymph node.    Thyroid bed ultrasound 07/03/2020  IMPRESSION: No residual/recurrent tissue post thyroidectomy.  ASSESSMENT / PLAN / RECOMMENDATIONS:   Papillary thyroid carcinoma ( pT3a, pNx) , Stage II  - S/P RAI ablation 11/2019  - TSH goal for now is 0.1-0.5 uIU/mL  - Tg pending and Tg Ab trending down  - Will proceed with thyroid bed ultrasound  - So far with excellent biochemical and structural response to  therapy     2. Post-Operative Hypothyroidism:   - Pt is clinically euthyroid  - Her TSh is at goal, will continue to monitor , no changes   Medications    Levothyroxine 175 mcg , TWO on Saturday and Sunday and 1 tablet rest of the week      F/u in 6 months  Labs in 6 weeks   Signed electronically by: Mack Guise, MD  [facility]  [address], [place] [address] Phone: [phone] FAX: [phone]      CC: Elby Showers, MD [address] [place] [address] Phone: [phone]  Fax: [phone]   Return to Endocrinology clinic as below: Future Appointments  Date Time Provider Bells  12/17/2020  9:10 AM Loghan Subia, Melanie Crazier, MD [facility] None  01/21/2021  8:15 AM Trula Slade, DPM [facility]

## 2020-12-26 ENCOUNTER — Other Ambulatory Visit: Payer: Medicare Other

## 2020-12-26 LAB — TGAB+THYROGLOBULIN IMA OR LCMS: Thyroglobulin Antibody: 3 IU/mL — ABNORMAL HIGH (ref 0.0–0.9)

## 2020-12-26 LAB — THYROGLOBULIN BY LCMS: Thyroglobulin by LCMS: <0.2 ng/mL — ABNORMAL LOW (ref 1.5–38.5)

## 2020-12-27 ENCOUNTER — Ambulatory Visit
Admission: RE | Admit: 2020-12-27 | Discharge: 2020-12-27 | Disposition: A | Discharge status: Home / Self Care | Payer: Medicare Other | Source: Ambulatory Visit | Attending: Internal Medicine | Admitting: Internal Medicine

## 2020-12-27 ENCOUNTER — Other Ambulatory Visit: Payer: Self-pay

## 2020-12-27 DIAGNOSIS — C73 Malignant neoplasm of thyroid gland: Secondary | ICD-10-CM | POA: Diagnosis not present

## 2020-12-27 DIAGNOSIS — E890 Postprocedural hypothyroidism: Secondary | ICD-10-CM

## 2021-01-11 ENCOUNTER — Other Ambulatory Visit: Payer: Self-pay | Admitting: Internal Medicine

## 2021-01-11 ENCOUNTER — Telehealth: Payer: Self-pay | Admitting: Internal Medicine

## 2021-01-11 NOTE — Telephone Encounter (Signed)
LVM to CB and schedule CPE for January

## 2021-01-11 NOTE — Telephone Encounter (Signed)
scheduled

## 2021-01-15 NOTE — Telephone Encounter (Signed)
CPE scheduled  

## 2021-01-21 ENCOUNTER — Encounter: Payer: Self-pay | Admitting: Podiatry

## 2021-01-21 ENCOUNTER — Ambulatory Visit (INDEPENDENT_AMBULATORY_CARE_PROVIDER_SITE_OTHER): Payer: Medicare Other | Admitting: Podiatry

## 2021-01-21 ENCOUNTER — Other Ambulatory Visit: Payer: Self-pay

## 2021-01-21 DIAGNOSIS — Q828 Other specified congenital malformations of skin: Secondary | ICD-10-CM | POA: Diagnosis not present

## 2021-01-21 DIAGNOSIS — M79676 Pain in unspecified toe(s): Secondary | ICD-10-CM

## 2021-01-21 DIAGNOSIS — Z89429 Acquired absence of other toe(s), unspecified side: Secondary | ICD-10-CM

## 2021-01-21 DIAGNOSIS — B351 Tinea unguium: Secondary | ICD-10-CM | POA: Diagnosis not present

## 2021-01-21 DIAGNOSIS — G629 Polyneuropathy, unspecified: Secondary | ICD-10-CM

## 2021-01-21 DIAGNOSIS — Z7901 Long term (current) use of anticoagulants: Secondary | ICD-10-CM | POA: Diagnosis not present

## 2021-01-21 NOTE — Progress Notes (Signed)
Patient ID: Cassidy [name], female   DOB: [birth year], 80 y.o.   MRN: [id number]  Subjective: Ms. Towell, 80 year old female presents the office today for concerns of thick painful toenails and calluses to both of her feet.  She says the callus on the ball of her left foot to become more tender.  No open sores, swelling or redness or any drainage.  No other concerns today.  Objective: AAO 3, NAD DP/PT pulses are palpable, CRT less than 3 seconds  Protective sensation absent with Derrel Nip monofilament Minimal hyperkeratotic tissue right plantar hallux.  Hyperkeratotic lesion also noted submetatarsal 2 left foot and mild to the fifth metatarsal head plantarly.  Upon debridement there is no underlying ulceration drainage or any signs of infection. Nails are hypertrophic, dystrophic, brittle, discolored 9. There is tenderness palpation to the nails.  No pain with calf compression, swelling, warmth, erythema.  Assessment: 80 year old female with pre-ulcerative calluses bilaterally, symptomatic onychomycosis  Plan: -Treatment options discussed including all alternatives, risks, and complications -Nail sharply debrided 9.  Upon debridement of the right hallux swelling bleeding occurred.  Areas cleaned and antibiotic ointment and dressing applied.  Monitoring signs or symptoms of infection -Hyperkeratotic lesion sharply debrided 3 without complications. Monitor for any skin breakdown. Continue offloading. Moisturizer  -Discussed the importance of daily foot inspection -Follow-up as scheduled or sooner if any problems arise. In the meantime, encouraged to call the office with any questions, concerns, change in symptoms.   Celesta Gentile, DPM

## 2021-02-25 ENCOUNTER — Other Ambulatory Visit: Payer: Self-pay | Admitting: Dermatology

## 2021-02-25 DIAGNOSIS — D485 Neoplasm of uncertain behavior of skin: Secondary | ICD-10-CM | POA: Diagnosis not present

## 2021-02-25 DIAGNOSIS — D225 Melanocytic nevi of trunk: Secondary | ICD-10-CM | POA: Diagnosis not present

## 2021-02-25 DIAGNOSIS — L578 Other skin changes due to chronic exposure to nonionizing radiation: Secondary | ICD-10-CM | POA: Diagnosis not present

## 2021-02-25 DIAGNOSIS — D0372 Melanoma in situ of left lower limb, including hip: Secondary | ICD-10-CM | POA: Diagnosis not present

## 2021-02-25 DIAGNOSIS — Z86018 Personal history of other benign neoplasm: Secondary | ICD-10-CM | POA: Diagnosis not present

## 2021-02-25 DIAGNOSIS — Z85820 Personal history of malignant melanoma of skin: Secondary | ICD-10-CM | POA: Diagnosis not present

## 2021-02-25 DIAGNOSIS — L820 Inflamed seborrheic keratosis: Secondary | ICD-10-CM | POA: Diagnosis not present

## 2021-02-25 DIAGNOSIS — Z23 Encounter for immunization: Secondary | ICD-10-CM | POA: Diagnosis not present

## 2021-02-25 DIAGNOSIS — L821 Other seborrheic keratosis: Secondary | ICD-10-CM | POA: Diagnosis not present

## 2021-02-25 DIAGNOSIS — L814 Other melanin hyperpigmentation: Secondary | ICD-10-CM | POA: Diagnosis not present

## 2021-02-25 DIAGNOSIS — D2261 Melanocytic nevi of right upper limb, including shoulder: Secondary | ICD-10-CM | POA: Diagnosis not present

## 2021-02-25 DIAGNOSIS — D2272 Melanocytic nevi of left lower limb, including hip: Secondary | ICD-10-CM | POA: Diagnosis not present

## 2021-02-27 LAB — DERMATOPATH , 2 SPECIMENS

## 2021-02-27 LAB — DERMATOPATHOLOGY REPORT

## 2021-03-25 ENCOUNTER — Ambulatory Visit (INDEPENDENT_AMBULATORY_CARE_PROVIDER_SITE_OTHER): Payer: Medicare Other | Admitting: Podiatry

## 2021-03-25 ENCOUNTER — Other Ambulatory Visit: Payer: Self-pay

## 2021-03-25 DIAGNOSIS — Q828 Other specified congenital malformations of skin: Secondary | ICD-10-CM

## 2021-03-25 DIAGNOSIS — Z7901 Long term (current) use of anticoagulants: Secondary | ICD-10-CM | POA: Diagnosis not present

## 2021-03-25 DIAGNOSIS — B351 Tinea unguium: Secondary | ICD-10-CM | POA: Diagnosis not present

## 2021-03-25 DIAGNOSIS — M79676 Pain in unspecified toe(s): Secondary | ICD-10-CM | POA: Diagnosis not present

## 2021-03-25 NOTE — Progress Notes (Signed)
Patient ID: Cassidy [name], female   DOB: 19-Aug-1940, 80 y.o.   MRN: [id number]  Subjective: Cassidy [name], 80 year old female presents the office today for concerns of thick painful toenails and calluses to both of her feet as well as calluses. She has a history of right foot reconstructive surgery for flatfeet several years ago and she is stating to have some discomfort.  She states that she has been to follow-up with her doctor that did the surgery for this pain.  Objective: AAO 3, NAD DP/PT pulses are palpable, CRT less than 3 seconds  Protective sensation absent with Derrel Nip monofilament Minimal hyperkeratotic tissue right plantar hallux.  Hyperkeratotic lesions also noted submetatarsal 2 left foot and mild to the fifth metatarsal head plantarly.  Upon debridement there is no underlying ulceration drainage or any signs of infection. Nails are hypertrophic, dystrophic, brittle, discolored 9. There is tenderness palpation to the nails.  No pain with calf compression, swelling, warmth, erythema.  Assessment: 80 year old female with pre-ulcerative calluses bilaterally, symptomatic onychomycosis  Plan: -Treatment options discussed including all alternatives, risks, and complications -Nail sharply debrided 9 without any complications or bleeding -Hyperkeratotic lesion sharply debrided 3 without complications. Monitor for any skin breakdown. Continue offloading. Moisturizer  -Discussed and reviewed x-rays injected right foot pain but she did not follow-up with her other doctor.  She states that she will let me know if she wants me to further evaluate. -Discussed the importance of daily foot inspection -Follow-up as scheduled or sooner if any problems arise. In the meantime, encouraged to call the office with any questions, concerns, change in symptoms.   Celesta Gentile, DPM

## 2021-05-09 DIAGNOSIS — L905 Scar conditions and fibrosis of skin: Secondary | ICD-10-CM | POA: Diagnosis not present

## 2021-05-09 DIAGNOSIS — D0372 Melanoma in situ of left lower limb, including hip: Secondary | ICD-10-CM | POA: Diagnosis not present

## 2021-05-13 ENCOUNTER — Other Ambulatory Visit: Payer: Medicare Other | Admitting: Internal Medicine

## 2021-05-13 DIAGNOSIS — I10 Essential (primary) hypertension: Secondary | ICD-10-CM

## 2021-05-13 DIAGNOSIS — Z13220 Encounter for screening for lipoid disorders: Secondary | ICD-10-CM

## 2021-05-13 DIAGNOSIS — E119 Type 2 diabetes mellitus without complications: Secondary | ICD-10-CM

## 2021-05-13 DIAGNOSIS — E039 Hypothyroidism, unspecified: Secondary | ICD-10-CM

## 2021-05-14 ENCOUNTER — Other Ambulatory Visit: Payer: Self-pay

## 2021-05-14 ENCOUNTER — Other Ambulatory Visit: Payer: Medicare Other | Admitting: Internal Medicine

## 2021-05-14 DIAGNOSIS — Z13220 Encounter for screening for lipoid disorders: Secondary | ICD-10-CM

## 2021-05-14 DIAGNOSIS — Z87898 Personal history of other specified conditions: Secondary | ICD-10-CM

## 2021-05-14 DIAGNOSIS — R799 Abnormal finding of blood chemistry, unspecified: Secondary | ICD-10-CM | POA: Diagnosis not present

## 2021-05-14 DIAGNOSIS — E039 Hypothyroidism, unspecified: Secondary | ICD-10-CM | POA: Diagnosis not present

## 2021-05-14 DIAGNOSIS — I10 Essential (primary) hypertension: Secondary | ICD-10-CM | POA: Diagnosis not present

## 2021-05-14 NOTE — Progress Notes (Signed)
Lab only 

## 2021-05-16 LAB — COMPLETE METABOLIC PANEL WITH GFR
AG Ratio: 1.6 (calc) (ref 1.0–2.5)
ALT: 16 U/L (ref 6–29)
AST: 18 U/L (ref 10–35)
Albumin: 4.7 g/dL (ref 3.6–5.1)
Alkaline phosphatase (APISO): 100 U/L (ref 37–153)
BUN/Creatinine Ratio: 13 (calc) (ref 6–22)
BUN: 14 mg/dL (ref 7–25)
CO2: 34 mmol/L — ABNORMAL HIGH (ref 20–32)
Calcium: 9.4 mg/dL (ref 8.6–10.4)
Chloride: 101 mmol/L (ref 98–110)
Creat: 1.09 mg/dL — ABNORMAL HIGH (ref 0.60–0.95)
Globulin: 2.9 g/dL (calc) (ref 1.9–3.7)
Glucose, Bld: 108 mg/dL — ABNORMAL HIGH (ref 65–99)
Potassium: 4 mmol/L (ref 3.5–5.3)
Sodium: 141 mmol/L (ref 135–146)
Total Bilirubin: 0.8 mg/dL (ref 0.2–1.2)
Total Protein: 7.6 g/dL (ref 6.1–8.1)
eGFR: 51 mL/min/{1.73_m2} — ABNORMAL LOW (ref >=60)

## 2021-05-16 LAB — HEMOGLOBIN A1C
Hgb A1c MFr Bld: 5.4 % of total Hgb (ref <5.7)
Mean Plasma Glucose: 108 mg/dL
eAG (mmol/L): 6 mmol/L

## 2021-05-16 LAB — CBC WITH DIFFERENTIAL/PLATELET
Absolute Monocytes: 539 cells/uL (ref 200–950)
Basophils Absolute: 91 cells/uL (ref 0–200)
Basophils Relative: 1.3 %
Eosinophils Absolute: 266 cells/uL (ref 15–500)
Eosinophils Relative: 3.8 %
HCT: 43.7 % (ref 35.0–45.0)
Hemoglobin: 14.4 g/dL (ref 11.7–15.5)
Lymphs Abs: 2443 cells/uL (ref 850–3900)
MCH: 29.4 pg (ref 27.0–33.0)
MCHC: 33 g/dL (ref 32.0–36.0)
MCV: 89.2 fL (ref 80.0–100.0)
MPV: 10.3 fL (ref 7.5–12.5)
Monocytes Relative: 7.7 %
Neutro Abs: 3661 cells/uL (ref 1500–7800)
Neutrophils Relative %: 52.3 %
Platelets: 256 10*3/uL (ref 140–400)
RBC: 4.9 10*6/uL (ref 3.80–5.10)
RDW: 13.1 % (ref 11.0–15.0)
Total Lymphocyte: 34.9 %
WBC: 7 10*3/uL (ref 3.8–10.8)

## 2021-05-16 LAB — LIPID PANEL
Cholesterol: 208 mg/dL — ABNORMAL HIGH (ref <200)
HDL: 53 mg/dL (ref >=50)
LDL Cholesterol (Calc): 128 mg/dL (calc) — ABNORMAL HIGH
Non-HDL Cholesterol (Calc): 155 mg/dL (calc) — ABNORMAL HIGH (ref <130)
Total CHOL/HDL Ratio: 3.9 (calc) (ref <5.0)
Triglycerides: 155 mg/dL — ABNORMAL HIGH (ref <150)

## 2021-05-16 LAB — TSH: TSH: 0.71 mIU/L (ref 0.40–4.50)

## 2021-05-20 ENCOUNTER — Encounter: Payer: Self-pay | Admitting: Internal Medicine

## 2021-05-20 ENCOUNTER — Ambulatory Visit (INDEPENDENT_AMBULATORY_CARE_PROVIDER_SITE_OTHER): Payer: Medicare Other | Admitting: Internal Medicine

## 2021-05-20 ENCOUNTER — Other Ambulatory Visit: Payer: Self-pay

## 2021-05-20 VITALS — BP 124/84 | HR 103 | Temp 98.3°F | Resp 16 | Ht 63.0 in | Wt 219.8 lb

## 2021-05-20 DIAGNOSIS — R8290 Unspecified abnormal findings in urine: Secondary | ICD-10-CM

## 2021-05-20 DIAGNOSIS — R7301 Impaired fasting glucose: Secondary | ICD-10-CM

## 2021-05-20 DIAGNOSIS — Z0289 Encounter for other administrative examinations: Secondary | ICD-10-CM

## 2021-05-20 DIAGNOSIS — Z0000 Encounter for general adult medical examination without abnormal findings: Secondary | ICD-10-CM

## 2021-05-20 LAB — POCT URINALYSIS DIPSTICK
Bilirubin, UA: NEGATIVE
Glucose, UA: NEGATIVE
Ketones, UA: NEGATIVE
Nitrite, UA: POSITIVE
Protein, UA: NEGATIVE
Spec Grav, UA: 1.02 (ref 1.010–1.025)
Urobilinogen, UA: 0.2 E.U./dL
pH, UA: 5 (ref 5.0–8.0)

## 2021-05-20 LAB — TIQ-MISC

## 2021-05-20 MED ORDER — PROMETHAZINE HCL 25 MG PO TABS: 25.0000 mg | ORAL_TABLET | Freq: Three times a day (TID) | ORAL | 0 refills | Status: AC | PRN

## 2021-05-20 NOTE — Progress Notes (Signed)
Annual Wellness Visit     Patient: Cassidy [name], Female    DOB: Sep 10, 1940, 81 y.o.   MRN: [id number] Visit Date: 05/20/2021  Chief Complaint  Patient presents with   [facility] Wellness   Annual Exam   Cassidy [name] is a 81 y.o. Female who presents today for her Annual Wellness Visit.  HPI She also presents for health maintenance exam and evaluation of medical issues.  She has a history of postsurgical hypothyroidism, hypertension, osteopenia, hyperlipidemia, dependent edema, impaired glucose tolerance and obesity.  In 2015 she had melanoma removed from her right back requiring a wide excision.    In 2016 she had melanoma excised from her right foot.  At that time a dysplastic nevus of her right shoulder was discovered for which she underwent a wide excision.  In March 2016 she developed redness and swelling as well as drainage from previously healed wounds on her right foot and ankle.  She was placed on antibiotics and DVT was ruled out.  She was seen at [facility], diagnosed with delayed surgical wound healing and was treated there at the wound care center for several months.  She had left TKA by Dr. Mayer Camel in August 2020.  She had right knee arthroplasty in July 2010 and subsequently developed bilateral pulmonary emboli after arthroplasty.  Right wrist ganglion cyst surgery 2008.  Left ankle surgery 2005 involving a bone graft.  Arthroscopic surgery for torn ligaments in right knee 2003  Ovarian cystectomy 1968, vaginal hysterectomy 1982, rectocele repair 2009, cholecystectomy 2003.  Patient is seen at [facility] by Dr. Ellie Lunch and  has had cataract extraction of her left eye.  Sees podiatrist regarding dermatophytosis of her nails.  History of urinary infections in the past but underwent posterior colporrhaphy GYN surgery in 2009.  Rarely has urinary tract infections now.  Had [facility]iol[facility] in April 2009 which was normal.  Search on Care Everywhere looks like she had colonoscopy by Dr. Juleen Starr in 2007 at [facility].  I do not have a copy of that report but it mentions endoscopy lab in [place].  She is agreeable to Cologuard screening.  Right ganglion cyst surgery in 2008  Had total thyroidectomy February 2021 by Dr. Harlow Asa and is followed by endocrinology.  Was found to have papillary thyroid carcinoma involving the left lobe of the thyroid.  Margins were negative for tumor.  Had benign nodules in the right inferior lobe and posterior aspect of the mid left lobe.  Now on levothyroxine 175 mcg daily per Dr. Kelton Pillar.   Social history: She is divorced.  She resides alone.  Does not smoke.  Social alcohol consumption.  2 adult children, son and a daughter.  3 grandchildren.  Family history: Father died at age 52 of a sudden MI.  Mother died of heart problems at age 54.  40 brother deceased in an airplane crash at age 59.  2 sisters in good health.  Daughter with history of breast cancer doing well.  Her urine specimen today is cloudy.  Moderate occult blood noted on dipstick.  She is asymptomatic. Has moderate 2+ LE and moderate occult blood.  Nitrite is positive.  Urine sent for culture.  Patient Care Team: Elby Showers, MD as PCP - General (Internal Medicine)  Review of Systems no new complaints   Objective    Vitals: BP 124/84  Pulse (!) 103    Temp 98.3 F (36.8 C) (Tympanic)    Resp 16    Ht 5\' 3"  (1.6 m)    Wt 219 lb 12 oz (99.7 kg)    SpO2 97%    BMI 38.93 kg/m   Physical Exam Pleasant and alert Female in no acute distress.  TMs are clear.  Pharynx is clear.  Neck is supple.  No carotid bruits.  Chest is clear to auscultation.  Cardiac exam: Regular rate and rhythm without ectopy.  Abdomen is soft nondistended without hepatosplenomegaly masses or tenderness.  She has her legs wrapped with compression stockings.  History of  dependent edema.  She is alert and oriented x3.  No focal deficits on brief neurological exam.  Affect thought and judgment are normal. Her fasting glucose is 108.  Hemoglobin A1c 5.4%.  Total cholesterol is 208, HDL 53, triglycerides 155 and LDL cholesterol 128.  Longstanding history of elevated LDL.  Has not wanted to be on statin medication.  Creatinine stable at 1.09.  Electrolytes and liver functions are normal.  History of papillary carcinoma of thyroid status post thyroidectomy and is on thyroid replacement per endocrinologist.  Essential hypertension stable on current regimen.  She had hypothyroidism prior to thyroidectomy.  Hyperlipidemia-currently not on statin.  Continue to work on diet.  Not really able to exercise very much because of chronic leg and foot issues and dependent edema.  Dependent edema treated with Lasix  History of melanoma followed by dermatology  Impaired glucose tolerance  Obesity BMI 38.93  History of right knee arthroplasty  History of bilateral pulmonary emboli after right knee arthroplasty  History of left knee arthroplasty  Plan: Her urine specimen is abnormal.  She may have a hard time getting a good clean-catch specimen but culture was sent.  She is asymptomatic.  She needs to work on her diet a bit.  Not really able to exercise.  She will see Dr. Gayla Medicus for soon regarding follow-up on surgical hypothyroidism.  She also has impaired glucose tolerance that is diet controlled at the present time and hyperlipidemia but has not been on statin.  Plan: Return in 1 year or as needed and continue current medications.  She requests prescription for Phenergan which she occasionally takes for nausea.  This was sent to pharmacy.  Most recent functional status assessment: In your present state of health, do you have any difficulty performing the following activities: 05/20/2021  Hearing? N  Vision? N  Difficulty concentrating or making decisions? N   Walking or climbing stairs? N  Dressing or bathing? N  Doing errands, shopping? N  Preparing Food and eating ? N  Using the Toilet? N  In the past six months, have you accidently leaked urine? N  Do you have problems with loss of bowel control? N  Managing your Medications? N  Managing your Finances? N  Housekeeping or managing your Housekeeping? N  Some recent data might be hidden   Most recent fall risk assessment: Fall Risk  05/20/2021  Falls in the past year? 0  Number falls in past yr: 0  Injury with Fall? 0  Risk for fall due to : Impaired mobility  Follow up Falls evaluation completed    Most recent depression screenings: PHQ 2/9 Scores 05/20/2021 05/17/2020  PHQ - 2 Score 0 0  Exception Documentation - -   Most recent cognitive screening: 6CIT Screen 05/20/2021  What Year? 0 points  What month? 0 points  What time?  0 points  Count back from 20 0 points  Months in reverse 0 points  Repeat phrase 0 points  Total Score 0       Assessment & Plan     Annual wellness visit done today including the all of the following: Reviewed patient's Family Medical History Reviewed and updated list of patient's medical providers Assessment of cognitive impairment was done Assessed patient's functional ability Established a written schedule for health screening [place] Completed and Reviewed  Discussed health benefits of physical activity, and encouraged her to engage in regular exercise appropriate for her age and condition.         {I, Elby Showers, MD, have reviewed all documentation for this visit. The documentation on 05/20/21 for the exam, diagnosis, procedures, and orders are all accurate and complete.   Angus Seller, CMA

## 2021-05-20 NOTE — Patient Instructions (Addendum)
It was a pleasure to see today. Watch diet. Continue Endocrinology follow up. RTC in one year. Have sent in Rx for Phenergan tablets as requested.  Cologuard ordered.

## 2021-05-21 ENCOUNTER — Other Ambulatory Visit: Payer: Self-pay

## 2021-05-21 DIAGNOSIS — Z1211 Encounter for screening for malignant neoplasm of colon: Secondary | ICD-10-CM

## 2021-05-21 DIAGNOSIS — Z1213 Encounter for screening for malignant neoplasm of small intestine: Secondary | ICD-10-CM

## 2021-05-21 NOTE — Addendum Note (Signed)
Addended by: Angus Seller on: 05/21/2021 08:47 AM   Modules accepted: Orders

## 2021-05-23 LAB — TEST AUTHORIZATION

## 2021-05-23 LAB — URINE CULTURE
MICRO NUMBER:: 12936875
SPECIMEN QUALITY:: ADEQUATE

## 2021-05-23 LAB — MICROALBUMIN / CREATININE URINE RATIO
Creatinine, Urine: 74 mg/dL (ref 20–275)
Microalb Creat Ratio: 127 mcg/mg creat — ABNORMAL HIGH (ref <30)
Microalb, Ur: 9.4 mg/dL

## 2021-05-24 MED ORDER — LEVOFLOXACIN 250 MG PO TABS: 250.0000 mg | ORAL_TABLET | Freq: Every day | ORAL | 0 refills | Status: DC

## 2021-05-24 NOTE — Addendum Note (Signed)
Addended by: Angus Seller on: 05/24/2021 03:00 PM   Modules accepted: Orders

## 2021-05-27 ENCOUNTER — Ambulatory Visit (INDEPENDENT_AMBULATORY_CARE_PROVIDER_SITE_OTHER): Payer: Medicare Other | Admitting: Podiatry

## 2021-05-27 ENCOUNTER — Other Ambulatory Visit: Payer: Self-pay

## 2021-05-27 DIAGNOSIS — Q828 Other specified congenital malformations of skin: Secondary | ICD-10-CM | POA: Diagnosis not present

## 2021-05-27 DIAGNOSIS — B351 Tinea unguium: Secondary | ICD-10-CM

## 2021-05-27 DIAGNOSIS — M79676 Pain in unspecified toe(s): Secondary | ICD-10-CM | POA: Diagnosis not present

## 2021-05-27 DIAGNOSIS — Z7901 Long term (current) use of anticoagulants: Secondary | ICD-10-CM | POA: Diagnosis not present

## 2021-05-27 NOTE — Progress Notes (Signed)
Patient ID: Cassidy [name], female   DOB: [birth year], 81 y.o.   MRN: [id number]  Subjective: Cassidy [name], 81 year old female presents the office today for concerns of thick painful toenails and calluses to both of her feet as well as calluses.   She has a history of multiple melanoma surgeries.  She recently had one removed on her leg and is cleared.  She has not noticed any new spots that she follows up with dermatology every 3 months.   Objective: AAO 3, NAD DP/PT pulses are palpable, CRT less than 3 seconds  Protective sensation absent with Derrel Nip monofilament Minimal hyperkeratotic tissue right plantar hallux.  Hyperkeratotic lesions also noted submetatarsal 2 left foot, semesters of 5 bilaterally as well as right metatarsal 1.  Upon debridement there is no underlying ulceration drainage or any signs of infection. Nails are hypertrophic, dystrophic, brittle, discolored 9. There is tenderness palpation to the nails.  No pain with calf compression, swelling, warmth, erythema.  Assessment: 81 year old female with pre-ulcerative calluses bilaterally, symptomatic onychomycosis  Plan: -Treatment options discussed including all alternatives, risks, and complications -Nail sharply debrided 9 without any complications or bleeding -Hyperkeratotic lesion sharply debrided 4 without complications. Monitor for any skin breakdown. Continue offloading. Moisturizer  -Discussed the importance of daily foot inspection -Follow-up as scheduled or sooner if any problems arise. In the meantime, encouraged to call the office with any questions, concerns, change in symptoms.   Celesta Gentile, DPM

## 2021-05-31 ENCOUNTER — Telehealth: Payer: Self-pay | Admitting: Internal Medicine

## 2021-05-31 ENCOUNTER — Ambulatory Visit (INDEPENDENT_AMBULATORY_CARE_PROVIDER_SITE_OTHER): Payer: Medicare Other | Admitting: Internal Medicine

## 2021-05-31 ENCOUNTER — Other Ambulatory Visit: Payer: Self-pay

## 2021-05-31 DIAGNOSIS — R82998 Other abnormal findings in urine: Secondary | ICD-10-CM

## 2021-05-31 DIAGNOSIS — N390 Urinary tract infection, site not specified: Secondary | ICD-10-CM

## 2021-05-31 DIAGNOSIS — R319 Hematuria, unspecified: Secondary | ICD-10-CM

## 2021-05-31 LAB — POCT URINALYSIS DIPSTICK
Bilirubin, UA: NEGATIVE
Glucose, UA: NEGATIVE
Ketones, UA: NEGATIVE
Nitrite, UA: NEGATIVE
Protein, UA: NEGATIVE
Spec Grav, UA: 1.015 (ref 1.010–1.025)
Urobilinogen, UA: 0.2 E.U./dL
pH, UA: 5 (ref 5.0–8.0)

## 2021-05-31 NOTE — Addendum Note (Signed)
Addended by: Angus Seller on: 05/31/2021 12:32 PM   Modules accepted: Orders

## 2021-05-31 NOTE — Progress Notes (Signed)
UTI recheck still has blood and leuks. Sent for culture and microscopy.

## 2021-05-31 NOTE — Telephone Encounter (Signed)
Patient says specimen she brought in was a clean catch and she used a boiled container. It is abnormal. She will come back next week for clean catch urine specimen here. Says she is asymptomatic. MJB, MD

## 2021-06-05 DIAGNOSIS — Z1211 Encounter for screening for malignant neoplasm of colon: Secondary | ICD-10-CM | POA: Diagnosis not present

## 2021-06-07 ENCOUNTER — Ambulatory Visit (INDEPENDENT_AMBULATORY_CARE_PROVIDER_SITE_OTHER): Payer: Medicare Other | Admitting: Internal Medicine

## 2021-06-07 ENCOUNTER — Other Ambulatory Visit: Payer: Self-pay

## 2021-06-07 VITALS — BP 128/70 | HR 117 | Temp 99.1°F

## 2021-06-07 DIAGNOSIS — N390 Urinary tract infection, site not specified: Secondary | ICD-10-CM | POA: Diagnosis not present

## 2021-06-07 DIAGNOSIS — R82998 Other abnormal findings in urine: Secondary | ICD-10-CM | POA: Diagnosis not present

## 2021-06-07 DIAGNOSIS — R319 Hematuria, unspecified: Secondary | ICD-10-CM | POA: Diagnosis not present

## 2021-06-07 LAB — POCT URINALYSIS DIPSTICK
Glucose, UA: NEGATIVE
Nitrite, UA: NEGATIVE
Protein, UA: POSITIVE — AB
Spec Grav, UA: 1.025 (ref 1.010–1.025)
Urobilinogen, UA: 0.2 E.U./dL
pH, UA: 5 (ref 5.0–8.0)

## 2021-06-09 LAB — URINE CULTURE
MICRO NUMBER:: 13024391
SPECIMEN QUALITY:: ADEQUATE

## 2021-06-12 ENCOUNTER — Telehealth: Payer: Self-pay | Admitting: Internal Medicine

## 2021-06-12 MED ORDER — LEVOFLOXACIN 500 MG PO TABS: 500.0000 mg | ORAL_TABLET | Freq: Every day | ORAL | 0 refills | Status: DC

## 2021-06-12 NOTE — Telephone Encounter (Addendum)
Cassidy [name] Peter Garter [phone]  Cassidy [name] called to say she would like for someone to call and explain her urine test results.  I called [name] at #;50 pm Feb 22. She had another urine culture done at time of her visit. We could not get any urine with quick cath as she urinated before we did the procedure but she felt it was a good clean specimen here at the office that she obtained. Since we had no urine on quick cath specimen, we send her voided specimen and it grew same bacteria as before. Therefore I elected to treat her with Levaquin this time at a higher dose for a longer period of time(10 days). She will return March 2nd and I would like to do quick cath specimen and not let her void first. MJB, MD

## 2021-06-12 NOTE — Telephone Encounter (Signed)
Levaquin 500 mg daily for 10 days sent to pharmacy. MJB, MD. Follow up March 2nd

## 2021-06-16 ENCOUNTER — Encounter: Payer: Self-pay | Admitting: Internal Medicine

## 2021-06-16 NOTE — Progress Notes (Signed)
° °  Subjective:    Patient ID: Cassidy [name], female    DOB: 17-Nov-1940, 81 y.o.   MRN: [id number]  HPI 81 year old Female was here for annual physical exam January 30th.  A urine specimen was requested.  She had trouble urinating.  Urine was cloudy with specific gravity 1.020.  Dipstick showed moderate occult blood. Urine was positive for nitrite and had moderate LE.  Culture was sent and grew Klebsiella pneumoniae.  Colony count was greater than 100,000 CFU per mL.  It was decided to treat her with antibiotics although she was asymptomatic.  She was given Levaquin 250 mg daily for 7 days.    She was asked to return today.  She voided in the restroom today.  I had planned to do an in and out quick cath and did not plan for her to void before the procedure.  She continues to be asymptomatic and has completed this course of Levaquin.  Urine that she obtained today was sent for culture.   Review of Systems see above     Objective:   Physical Exam  An in and out  quick cath was attempted to obtain a pure urine specimen was  but she voided before the procedure and her bladder was empty.      Assessment & Plan:  Specimen that she obtained in the bathroom today is abnormal and culture will be sent.  Could not do I&O quick cath as there was no urine in her bladder.  Plan: Await urine culture results.  Addendum urine culture shows Klebsiella pneumoniae again in in this time she will be treated with Levaquin 500 mg daily for 10 days and we will follow-up on March 2.  She was advised not to void before coming to the office so we can go ahead and do quick cath procedure.  I am concerned she does not give [place] a good clean-catch specimen.

## 2021-06-16 NOTE — Patient Instructions (Signed)
Urine culture you obtained by clean-catch method grew once again Klebsiella pneumoniae.  We are going to treat you with Levaquin 500 mg daily for 10 days and we will follow-up on March 2.  Please do not avoid before coming to the office.  We will try to get an in and out quick cath clean specimen.

## 2021-06-18 LAB — COLOGUARD: COLOGUARD: NEGATIVE

## 2021-06-20 ENCOUNTER — Other Ambulatory Visit: Payer: Self-pay

## 2021-06-20 ENCOUNTER — Ambulatory Visit: Payer: Medicare Other | Admitting: Internal Medicine

## 2021-06-20 ENCOUNTER — Encounter: Payer: Self-pay | Admitting: Internal Medicine

## 2021-06-20 ENCOUNTER — Ambulatory Visit (INDEPENDENT_AMBULATORY_CARE_PROVIDER_SITE_OTHER): Payer: Medicare Other | Admitting: Internal Medicine

## 2021-06-20 VITALS — BP 132/86 | HR 111 | Temp 97.0°F

## 2021-06-20 DIAGNOSIS — R8290 Unspecified abnormal findings in urine: Secondary | ICD-10-CM

## 2021-06-20 NOTE — Patient Instructions (Addendum)
Urine culture is pending.  We will see what results show and decide on treatment.  Was not given antibiotics today.  Advise urology consultation.  Patient request Cologuard. ?

## 2021-06-20 NOTE — Progress Notes (Signed)
? ?  Subjective:  ? ? Patient ID: Cassidy [name], female    DOB: 01-Apr-1941, 81 y.o.   MRN: [id number] ? ?HPI She has urinary urgency and frequency lomg standing. Postmenopausal for many years.  She was here for annual physical exam January 30.  Had abnormal urine specimen at that time.  Urine culture grew Klebsiella pneumoniae.  She was treated with Levaquin 250 mg daily for 7 days.  She was advised to have repeat specimen in 7 days.  Another urine specimen was obtained on February 10.  Urine dipstick showed moderate occult blood and moderate LE.  She did not use a sterile container but collected her urine in a container that had been boiled and still had abnormal urine findings.  Was advised to repeat this specimen correctly in the office.  When she returned on February 17, she voided and then we attempted to do an in and out quick Cath to get a pure specimen.  The voided specimen was abnormal and grew Klebsiella pneumoniae.  When we did the in and out quick cath there was no urine obtained and it was difficult to obtain it because she would not keep her legs apart during the catheterization process.  She was treated with Levaquin 500 mg daily for 10 days and was told return on March 2 and not void before coming. ? ? ? ?Review of Systems always has frequency. No dysuria, chills , fever, hematuria ? ?   ?Objective:  ? Physical Exam ? ?She is afebrile.  Today she has not voided before coming to the office.  We attempted to do in and out quick cath but she simply could not cooperate with procedure.  It was difficult to get the catheter into the urethra as she kept closing her legs.  There were just a couple of drops of urine in the container positive for LE.  We did get another clean-catch specimen after trying to get sterile urine.  Clean-catch urine was once again abnormal and sent for culture. ? ? ? ? ?   ?Assessment & Plan:  ?Abnormal urine specimen with leukocyte Estrace.  She voids frequently but is basically  asymptomatic in terms of dysuria, fever or chills. ? ?Plan: She will need urology evaluation.  Urine culture is pending. ? ?

## 2021-06-21 LAB — URINE CULTURE
MICRO NUMBER:: 13079458
SPECIMEN QUALITY:: ADEQUATE

## 2021-06-24 ENCOUNTER — Ambulatory Visit: Payer: Medicare Other | Admitting: Internal Medicine

## 2021-06-24 NOTE — Progress Notes (Deleted)
? ?Name: Cassidy [name]  ?MRN/ DOB: [id number], [birth year]    ?Age/ Sex: 81 y.o., female   ? ? ?PCP: Elby Showers, MD   ?Reason for Endocrinology Evaluation: PTC  ?   ?Initial Endocrinology Clinic Visit: 04/05/2020  ? ? ?PATIENT IDENTIFIER: Cassidy [name] is a 81 y.o., female with a past medical history of HTN, Hx of DVT's and melanoma . She has followed with [facility] since 04/05/2020  for consultative assistance with management of her MNG.  ? ? ? ? ?HISTORICAL SUMMARY:  ?Pt was noted to have a tracheal deviation on CXR during pre-op evaluation. A CT scan showed a left thyroid necrotic mass that measures ~ 6.5 cm. 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  ? ?Afirma came back benign for the 2.6 cm nodule but no afirma results  for the 5.9 nodule due to low follicular content.  ?She is S/P total thyroidectomy on 06/17/2019 with a pathology report of 6.0 cm PTC  ? ?Of note she has been on LT- 4 replacement for years ? ? ?A Thyrogen WBS 09/2019 showed Focal activity in thyroid bed consistent with remnant thyroid tissue and potential metastatic lymph node. Follow up thyroid bed ultrasound was unrevealing.  ? ? ?She is S/P RAI 53.5 mCi I-131  On 11/30/2019 ? ?Post-Op WBS scan demonstrated a Focus of increased tracer uptake at LEFT thyroid bed consistent with thyroid remnant and a  less intense focus of uptake cranial to the more intense focus at the LEFT thyroid bed, question additional thyroid remnant ?versus regional metastatic lymph node. ? ? ? ?SUBJECTIVE:  ? ? ?Today (06/24/2021):  Cassidy [name] is here for a follow up on total thyroidectomy and a PTC diagnosis.  ? ? ?Weight has been stable  ?She has noted longer  sleeping hours  ?Denies palpitations  ?She has been eating better and improved bowel movements  ?No local neck symptoms  ? ?Levothyroxine 200 mcg, TWO tabs on sundays and 1 tablet the rest of the week  ? ? ? ?HISTORY:  ?Past Medical History:  ?Past Medical History:  ?Diagnosis Date  ? Anemia   ? with pregnancy  ? Anxiety   ? Arthritis   ? Cancer [facility])   ? right foot,top of arm, lympth nodes removed  ? Dependent edema   ? DVT (deep venous thrombosis) ([place])   ? Fatty liver   ? Heart murmur   ? occ  ? HTN (hypertension), benign   ? Hyperlipidemia   ? Hypothyroidism   ? Lymphedema   ? Melanoma ([place])   ? Migraine 08/27/2010  ? Osteopenia   ? PE (pulmonary embolism) 2010  ? bilateral hx post op knee replacement  ? PONV (postoperative nausea and vomiting)   ? ?Past Surgical History:  ?Past Surgical History:  ?Procedure Laterality Date  ? ABDOMINAL HYSTERECTOMY    ? age 57  ? AMPUTATION  11/10/2011  ? Procedure: AMPUTATION RAY;  Surgeon: Kerin Salen, MD;  Location: [place];  Service: Orthopedics;  Laterality: Left;  LEFT 2ND TOE TRANS MIDDLE PHALANX AMPUTATION   ? ANKLE FUSION  2005  ? lt-fusion  ? APPENDECTOMY    ? CHOLECYSTECTOMY  02/14/2002  ? COLONOSCOPY    ?  GANGLION CYST EXCISION  2008  ? Rt wrist  ? JOINT REPLACEMENT  2010  ? rt total knee  ? KNEE ARTHROSCOPY  6/03  ? Rt knee  ? OVARIAN CYST SURGERY    ? RECTOCELE REPAIR  2009  ? REDUCTION MAMMAPLASTY Bilateral   ? THYROIDECTOMY N/A 06/17/2019  ? Procedure: TOTAL THYROIDECTOMY;  Surgeon: Armandina Gemma, MD;  Location[facility];  Service: General;  Laterality: N/A;  ? TOTAL KNEE ARTHROPLASTY Left 12/06/2018  ? Procedure: Left Knee Arthroplasty;  Surgeon: Frederik Pear, MD;  Location: [facility];  Service: Orthopedics;  Laterality: Left;  ? UPPER GI ENDOSCOPY    ? ?Social History:  reports that she has never smoked. She has never used smokeless tobacco. She reports that she does not drink alcohol and does not use drugs. ?Family History:  ?Family History  ?Problem  Relation Age of Onset  ? Heart disease Mother   ? Heart disease Father   ? Diabetes Brother   ? Heart disease Brother   ? Breast cancer Daughter 65  ? ? ? ?HOME MEDICATIONS: ?Allergies as of 06/24/2021   ?No Known Allergies ?  ? ?  ?Medication List  ?  ? ?  ? Accurate as of June 24, 2021  7:13 AM. If you have any questions, ask your nurse or doctor.  ?  ?  ? ?  ? ?ALPRAZolam 0.25 MG tablet ?Commonly known as: Duanne Moron ?Take 1 tablet (0.25 mg total) by mouth at bedtime as needed for anxiety. ?  ?amLODipine 5 MG tablet ?Commonly known as: NORVASC ?TAKE ONE TABLET DAILY ?  ?furosemide 40 MG tablet ?Commonly known as: LASIX ?TAKE ONE TABLET DAILY ?  ?levofloxacin 500 MG tablet ?Commonly known as: LEVAQUIN ?Take 1 tablet (500 mg total) by mouth daily. ?  ?levothyroxine 200 MCG tablet ?Commonly known as: SYNTHROID ?Take 1 tablet (200 mcg total) by mouth as directed. Two tablets on Sundays and 1 tablet the rest of the week ?  ?promethazine 25 MG tablet ?Commonly known as: PHENERGAN ?Take 1 tablet (25 mg total) by mouth every 8 (eight) hours as needed for nausea or vomiting. ?  ? ?  ? ? ? ? ?OBJECTIVE:  ? ?PHYSICAL EXAM: ?VS: There were no vitals taken for this visit. ? ? ?EXAM: ?General: Pt appears well and is in NAD  ?Neck: General: Supple without adenopathy. ?Thyroid: Surgical scar   ?Lungs: Clear with good BS bilat with no rales, rhonchi, or wheezes  ?Heart: Auscultation: RRR.  ?Abdomen: Normoactive bowel sounds, soft, nontender, without masses or organomegaly palpable  ?Extremities:  ?BL LE: None pitting  pretibial edema present  ?Mental Status: Judgment, insight: Intact ?Orientation: Oriented to time, place, and person ?Mood and affect: No depression, anxiety, or agitation  ? ? ? ?DATA REVIEWED: ? ?Results for Cassidy [name], Cassidy [name]" (MRN [id number]) as of 12/19/2020 09:34 ? Ref. Range 12/17/2020 09:54  ?TSH Latest Ref Range: 0.35 - 5.50 uIU/mL 0.40  ?Thyroglobulin Antibody Latest Ref Range: 0.0 - 0.9 IU/mL 3.0 (H)   ? ? ? ?Pathology report 06/17/2019 ?Procedure: Total thyroidectomy  ?Tumor Focality: Unifocal  ?Tumor Site: Left thyroid lobe  ?Tumor Size: 6.0 cm  ?Histologic Type: Papillary thyroid carcinoma, follicular variant  ?Margins: Uninvolved by tumor  ?Angioinvasion: Not identified  ?Lymphatic Invasion: Not identified  ?Extrathyroidal extension: Not identified  ?Regional Lymph Nodes: No lymph nodes submitted or found  ?Pathologic Stage Classification (pTNM, AJCC 8th Edition): pT3a, pNx  ?field  ? ? ?WBS 12/09/19 ? ?Focus of  increased tracer uptake at LEFT thyroid bed consistent with ?thyroid remnant. ?  ?Additional less intense focus of uptake cranial to the more intense ?focus at the LEFT thyroid bed, question additional thyroid remnant ?versus regional metastatic lymph node. ?  ? ?Thyroid bed ultrasound 07/03/2020 ? ?IMPRESSION: ?No residual/recurrent tissue post thyroidectomy. ? ?ASSESSMENT / PLAN / RECOMMENDATIONS:  ? ?Papillary thyroid carcinoma ( pT3a, pNx) , Stage II ? ?- S/P RAI ablation 11/2019  ?- TSH goal for now is 0.1-0.5 uIU/mL  ?- Tg pending and Tg Ab trending down  ?- Will proceed with thyroid bed ultrasound  ?- So far with excellent biochemical and structural response to therapy  ? ? ? ?2. Post-Operative Hypothyroidism:  ? ?- Pt is clinically euthyroid  ?- Her TSh is at goal, will continue to monitor , no changes  ? ?Medications  ? ? Levothyroxine 175 mcg , TWO on Saturday and Sunday and 1 tablet rest of the week  ? ? ? ? ?F/u in 6 months  ?Labs in 6 weeks ? ? ?Signed electronically by: ?Abby Nena Jordan, MD ? ?[facility]  ?[facility] ?[address] ?[place], [place] [address] ?Phone: [phone] ?FAX: [phone]  ? ? ? ? ?CC: ?Baxley, Cresenciano Lick, MD ?[address] ?[place] [address] ?Phone: [phone]  ?Fax: [phone] ? ? ?Return to Endocrinology clinic as below: ?Future Appointments  ?Date Time Provider [place]  ?06/24/2021  7:30 AM  Tita Terhaar, Melanie Crazier, MD [facility] None  ?07/29/2021  8:15 AM Jacqualyn Posey, Bonna Gains, DPM [facility]  ?05/20/2022  9:00 AM Baxley, Cresenciano Lick, MD [facility]  ?05/27/2022 10:00 AM Baxley, Cresenciano Lick, MD [facility]

## 2021-07-03 ENCOUNTER — Other Ambulatory Visit: Payer: Self-pay | Admitting: Internal Medicine

## 2021-07-05 ENCOUNTER — Ambulatory Visit: Payer: Medicare Other | Admitting: Internal Medicine

## 2021-07-16 ENCOUNTER — Ambulatory Visit (INDEPENDENT_AMBULATORY_CARE_PROVIDER_SITE_OTHER): Payer: Medicare Other | Admitting: Internal Medicine

## 2021-07-16 ENCOUNTER — Encounter: Payer: Self-pay | Admitting: Internal Medicine

## 2021-07-16 ENCOUNTER — Other Ambulatory Visit: Payer: Self-pay

## 2021-07-16 VITALS — BP 134/72 | HR 85 | Ht 63.0 in | Wt 222.0 lb

## 2021-07-16 DIAGNOSIS — C73 Malignant neoplasm of thyroid gland: Secondary | ICD-10-CM

## 2021-07-16 DIAGNOSIS — E890 Postprocedural hypothyroidism: Secondary | ICD-10-CM | POA: Diagnosis not present

## 2021-07-16 LAB — TSH: TSH: 0.2 u[IU]/mL — ABNORMAL LOW (ref 0.35–5.50)

## 2021-07-16 NOTE — Patient Instructions (Signed)

## 2021-07-16 NOTE — Progress Notes (Signed)
? ?Name: Cassidy [name]  ?MRN/ DOB: [id number], [birth year]    ?Age/ Sex: 81 y.o., female   ? ? ?PCP: Elby Showers, MD   ?Reason for Endocrinology Evaluation: PTC  ?   ?Initial Endocrinology Clinic Visit: 04/05/2020  ? ? ?PATIENT IDENTIFIER: [name] is a 81 y.o., female with a past medical history of HTN, Hx of DVT's and melanoma . She has followed with [facility] since 04/05/2020  for consultative assistance with management of her MNG.  ? ? ? ? ?HISTORICAL SUMMARY:  ?Pt was noted to have a tracheal deviation on CXR during pre-op evaluation. A CT scan showed a left thyroid necrotic mass that measures ~ 6.5 cm. 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  ? ?Afirma came back benign for the 2.6 cm nodule but no afirma results  for the 5.9 nodule due to low follicular content.  ?She is S/P total thyroidectomy on 06/17/2019 with a pathology report of 6.0 cm PTC  ? ?Of note she has been on LT- 4 replacement for years ? ? ?A Thyrogen WBS 09/2019 showed Focal activity in thyroid bed consistent with remnant thyroid tissue and potential metastatic lymph node. Follow up thyroid bed ultrasound was unrevealing.  ? ? ?She is S/P RAI 53.5 mCi I-131  On 11/30/2019 ? ?Post-Op WBS scan demonstrated a Focus of increased tracer uptake at LEFT thyroid bed consistent with thyroid remnant and a  less intense focus of uptake cranial to the more intense focus at the LEFT thyroid bed, question additional thyroid remnant ?versus regional metastatic lymph node. ? ? ? ?SUBJECTIVE:  ? ? ?Today (07/16/2021):  Cassidy [name] is here for a follow up on total thyroidectomy and a PTC diagnosis.  ? ? ?Weight has been stable  ?Denies palpitations  ?Has  occasional loose stools and diarrhea  ?No local neck symptoms  ?She is seeing a urologist for frequent UTI's  ?She sleeps better  ?Energy level is good  ? ?Levothyroxine 200 mcg, TWO tabs on sundays and 1 tablet the rest of the week  ? ? ? ?HISTORY:  ?Past Medical History:  ?Past Medical History:  ?Diagnosis Date  ? Anemia   ? with pregnancy  ? Anxiety   ? Arthritis   ? Cancer [facility])   ? right foot,top of arm, lympth nodes removed  ? Dependent edema   ? DVT (deep venous thrombosis) ([place])   ? Fatty liver   ? Heart murmur   ? occ  ? HTN (hypertension), benign   ? Hyperlipidemia   ? Hypothyroidism   ? Lymphedema   ? Melanoma ([place])   ? Migraine 08/27/2010  ? Osteopenia   ? PE (pulmonary embolism) 2010  ? bilateral hx post op knee replacement  ? PONV (postoperative nausea and vomiting)   ? ?Past Surgical History:  ?Past Surgical History:  ?Procedure Laterality Date  ? ABDOMINAL HYSTERECTOMY    ? age 54  ? AMPUTATION  11/10/2011  ? Procedure: AMPUTATION RAY;  Surgeon: Kerin Salen, MD;  Location: [facility] [place];  Service: Orthopedics;  Laterality: Left;  LEFT 2ND TOE TRANS MIDDLE PHALANX AMPUTATION   ? ANKLE FUSION  2005  ? lt-fusion  ? APPENDECTOMY    ? CHOLECYSTECTOMY  02/14/2002  ? COLONOSCOPY    ? GANGLION CYST EXCISION  2008  ? Rt wrist  ? JOINT REPLACEMENT  2010  ? rt total knee  ? KNEE ARTHROSCOPY  6/03  ? Rt knee  ? OVARIAN CYST SURGERY    ? RECTOCELE REPAIR  2009  ? REDUCTION MAMMAPLASTY Bilateral   ? THYROIDECTOMY N/A 06/17/2019  ? Procedure: TOTAL THYROIDECTOMY;  Surgeon: Armandina Gemma, MD;  [facility];  Service: General;  Laterality: N/A;  ? TOTAL KNEE ARTHROPLASTY Left 12/06/2018  ? Procedure: Left Knee Arthroplasty;  Surgeon: Frederik Pear, MD;  [facility];  Service: Orthopedics;  Laterality: Left;  ? UPPER GI ENDOSCOPY    ? ?Social History:  reports that she has never smoked. She has never used smokeless tobacco. She reports that she does not drink alcohol and does not use drugs. ?Family  History:  ?Family History  ?Problem Relation Age of Onset  ? Heart disease Mother   ? Heart disease Father   ? Diabetes Brother   ? Heart disease Brother   ? Breast cancer Daughter 34  ? ? ? ?HOME MEDICATIONS: ?Allergies as of 07/16/2021   ?No Known Allergies ?  ? ?  ?Medication List  ?  ? ?  ? Accurate as of July 16, 2021  7:24 AM. If you have any questions, ask your nurse or doctor.  ?  ?  ? ?  ? ?ALPRAZolam 0.25 MG tablet ?Commonly known as: Duanne Moron ?Take 1 tablet (0.25 mg total) by mouth at bedtime as needed for anxiety. ?  ?amLODipine 5 MG tablet ?Commonly known as: NORVASC ?TAKE ONE TABLET DAILY ?  ?furosemide 40 MG tablet ?Commonly known as: LASIX ?TAKE ONE TABLET DAILY ?  ?levofloxacin 500 MG tablet ?Commonly known as: LEVAQUIN ?Take 1 tablet (500 mg total) by mouth daily. ?  ?levothyroxine 200 MCG tablet ?Commonly known as: SYNTHROID ?TAKE 2 TABLETS ON SUNDAYS AND TAKE 1 TABLET DAILY THE REST OF THE WEEK ?  ?promethazine 25 MG tablet ?Commonly known as: PHENERGAN ?Take 1 tablet (25 mg total) by mouth every 8 (eight) hours as needed for nausea or vomiting. ?  ? ?  ? ? ? ? ?OBJECTIVE:  ? ?PHYSICAL EXAM: ?VS: BP 134/72 (BP Location: Left Arm, Patient Position: Sitting, Cuff Size: Large)   Pulse 85   Ht 5' 3"  (1.6 m)   Wt 222 lb (100.7 kg)   SpO2 96%   BMI 39.33 kg/m?  ? ? ? ?EXAM: ?General: Pt appears well and is in NAD  ?Neck: General: Supple without adenopathy. ?Thyroid: No nodules appreciated  ?Lungs: Clear with good BS bilat with no rales, rhonchi, or wheezes  ?Heart: Auscultation: RRR.  ?Extremities:  ?BL LE: None pitting  pretibial edema present.  Ace wrap in place bilaterally  ?Mental Status: Judgment, insight: Intact ?Orientation: Oriented to time, place, and person ?Mood and affect: No depression, anxiety, or agitation  ? ? ? ?DATA REVIEWED: ? Latest Reference Range & Units 07/16/21 09:18  ?TSH 0.35 - 5.50 uIU/mL 0.20 (L)  ?Thyroglobulin Antibody 0.0 - 0.9 IU/mL 1.7 (H)  ? ? ? ? ?Pathology report  06/17/2019 ?Procedure: Total thyroidectomy  ?Tumor Focality: Unifocal  ?Tumor Site: Left thyroid lobe  ?Tumor Size: 6.0 cm  ?Histologic Type: Papillary thyroid carcinoma, follicular variant  ?Margins: Uninvolved by tumor  ?Angioinvasion: Not identified  ?Lymphatic Invasion: Not identified  ?Extrathyroidal extension: Not identified  ?Regional Lymph Nodes: No lymph nodes submitted or found  ?Pathologic Stage Classification (pTNM, AJCC  8th Edition): pT3a, pNx  ?field  ? ? ?WBS 12/09/19 ? ?Focus of increased tracer uptake at LEFT thyroid bed consistent with ?thyroid remnant. ?  ?Additional less intense focus of uptake cranial to the more intense ?focus at the LEFT thyroid bed, question additional thyroid remnant ?versus regional metastatic lymph node. ?  ? ?Thyroid bed ultrasound 07/03/2020 ? ?IMPRESSION: ?No residual/recurrent tissue post thyroidectomy. ? ?ASSESSMENT / PLAN / RECOMMENDATIONS:  ? ?Papillary thyroid carcinoma ( pT3a, pNx) , Stage II ? ?- S/P RAI ablation 11/2019  ?- TSH goal for now is 0.1-0.5 uIU/mL  ?- Tg pending and Tg Ab continues to trend down ?- Her TSH is at the lower end of goal, I will back off on her levothyroxine due to the risk outweighs the benefit ? ? ? ?2. Post-Operative Hypothyroidism:  ? ?- Pt is clinically euthyroid  ?- Her TSh is is at the lower end of goal given increased risk of cardiac arrhythmia and increased bone resorption with iatrogenic hyperthyroidism, will reduce ? ?Medications  ? ?Decrease levothyroxine 200 mcg ,1.5 tablets on  Sunday and 1 tablet rest of the week  ? ? ? ? ?F/u in 6 months  ? ? ? ?Signed electronically by: ?Abby Nena Jordan, MD ? ?[facility]  ?[facility] ?[address] ?[place], [place] [address] ?Phone: [phone] ?FAX: [phone]  ? ? ? ? ?CC: ?Baxley, Cresenciano Lick, MD ?[address] ?[place] [address] ?Phone: [phone]  ?Fax: [phone] ? ? ?Return to Endocrinology clinic as below: ?Future Appointments   ?Date Time Provider [place]  ?07/16/2021  8:30 AM Maricel Swartzendruber, Melanie Crazier, MD [facility] None  ?07/29/2021  8:15 AM Jacqualyn Posey, Bonna Gains, DPM [facility]  ?05/20/2022  9:00 AM [facility]

## 2021-07-17 MED ORDER — LEVOTHYROXINE SODIUM 200 MCG PO TABS: 200.0000 ug | ORAL_TABLET | ORAL | 3 refills | Status: DC

## 2021-07-23 LAB — THYROGLOBULIN BY LCMS: Thyroglobulin by LCMS: <0.2 ng/mL — ABNORMAL LOW (ref 1.5–38.5)

## 2021-07-23 LAB — TGAB+THYROGLOBULIN IMA OR LCMS: Thyroglobulin Antibody: 1.7 IU/mL — ABNORMAL HIGH (ref 0.0–0.9)

## 2021-07-24 DIAGNOSIS — R3121 Asymptomatic microscopic hematuria: Secondary | ICD-10-CM | POA: Diagnosis not present

## 2021-07-24 DIAGNOSIS — N3941 Urge incontinence: Secondary | ICD-10-CM | POA: Diagnosis not present

## 2021-07-24 DIAGNOSIS — R351 Nocturia: Secondary | ICD-10-CM | POA: Diagnosis not present

## 2021-07-24 DIAGNOSIS — R350 Frequency of micturition: Secondary | ICD-10-CM | POA: Diagnosis not present

## 2021-07-24 DIAGNOSIS — N390 Urinary tract infection, site not specified: Secondary | ICD-10-CM | POA: Diagnosis not present

## 2021-07-29 ENCOUNTER — Ambulatory Visit (INDEPENDENT_AMBULATORY_CARE_PROVIDER_SITE_OTHER): Payer: Medicare Other | Admitting: Podiatry

## 2021-07-29 DIAGNOSIS — M79676 Pain in unspecified toe(s): Secondary | ICD-10-CM

## 2021-07-29 DIAGNOSIS — B351 Tinea unguium: Secondary | ICD-10-CM | POA: Diagnosis not present

## 2021-07-29 DIAGNOSIS — Q828 Other specified congenital malformations of skin: Secondary | ICD-10-CM

## 2021-07-29 DIAGNOSIS — I872 Venous insufficiency (chronic) (peripheral): Secondary | ICD-10-CM

## 2021-07-29 DIAGNOSIS — Z7901 Long term (current) use of anticoagulants: Secondary | ICD-10-CM | POA: Diagnosis not present

## 2021-07-29 NOTE — Progress Notes (Signed)
Patient ID: Cassidy [name], female   DOB: 1940-05-29, 81 y.o.   MRN: [id number] ? ?Subjective: ?Cassidy [name], 81 year old female presents the office today for concerns of thick painful toenails and calluses to both of her feet as well as calluses.  She states that she has a new spot on the right heel which causes discomfort.  Occasionally she will be up to soak it and it comes out on its own however not able to this time.  No swelling or redness or any drainage. ? ?She has chronic swelling to her legs and she is asking for multiple 3 inch Ace bandages in order to wrap the legs.  She is asking if I can order this for her.   ? ?Objective: ?AAO ?3, NAD ?DP/PT pulses are palpable, CRT less than 3 seconds  ?Chronic edema present bilaterally ?Protective sensation absent with Derrel Nip monofilament ?Thick hyperkeratotic tissue plantar aspect right hallux with dried blood.  Upon debridement there is no underlying ulceration drainage or signs of infection.  Also lesion on the right foot noted submetatarsal 1 and 5 as well as the plantar heel.  On the left side hyperkeratotic lesion submetatarsal 2 and submetatarsal 5.  No ulcerations noted ?No pain with calf compression, swelling, warmth, erythema. ? ?Assessment: ?81 year old female with pre-ulcerative calluses bilaterally, symptomatic onychomycosis ? ?Plan: ?-Treatment options discussed including all alternatives, risks, and complications ?-Nail sharply debrided ?9 without any complications or bleeding ?-Hyperkeratotic lesion sharply debrided ?5 without complications. Monitor for any skin breakdown. Continue offloading. Moisturizer  ?-I will order Ace bandages through [facility] given the venous insufficiency chronic chronic swelling ?-Discussed the importance of daily foot inspection ?-Follow-up as scheduled or sooner if any problems arise. In the meantime, encouraged to call the office with any questions, concerns, change in symptoms.  ? ?Celesta Gentile, DPM ?

## 2021-08-05 ENCOUNTER — Telehealth: Payer: Self-pay | Admitting: Podiatry

## 2021-08-05 NOTE — Telephone Encounter (Signed)
[facility] called for a follow-up on his wound assessment. It can be faxed to [phone] ?

## 2021-08-07 ENCOUNTER — Telehealth: Payer: Self-pay | Admitting: *Deleted

## 2021-08-07 NOTE — Telephone Encounter (Signed)
Faxed note ([phone])and called (no answer, left voice message-Kayla) [facility] that patient does not have any ulcers at the present per Dr Jacqualyn Posey and that the patient wanted a case of the ace bandages for the leg swelling.  ?Confirmation received 08/07/21. ?

## 2021-08-10 ENCOUNTER — Other Ambulatory Visit: Payer: Self-pay | Admitting: Internal Medicine

## 2021-08-12 DIAGNOSIS — R3121 Asymptomatic microscopic hematuria: Secondary | ICD-10-CM | POA: Diagnosis not present

## 2021-08-19 DIAGNOSIS — R8279 Other abnormal findings on microbiological examination of urine: Secondary | ICD-10-CM | POA: Diagnosis not present

## 2021-08-19 DIAGNOSIS — N200 Calculus of kidney: Secondary | ICD-10-CM | POA: Diagnosis not present

## 2021-08-21 DIAGNOSIS — D225 Melanocytic nevi of trunk: Secondary | ICD-10-CM | POA: Diagnosis not present

## 2021-08-21 DIAGNOSIS — L578 Other skin changes due to chronic exposure to nonionizing radiation: Secondary | ICD-10-CM | POA: Diagnosis not present

## 2021-08-21 DIAGNOSIS — S81812A Laceration without foreign body, left lower leg, initial encounter: Secondary | ICD-10-CM | POA: Diagnosis not present

## 2021-08-21 DIAGNOSIS — L821 Other seborrheic keratosis: Secondary | ICD-10-CM | POA: Diagnosis not present

## 2021-08-21 DIAGNOSIS — Z86018 Personal history of other benign neoplasm: Secondary | ICD-10-CM | POA: Diagnosis not present

## 2021-08-21 DIAGNOSIS — Z85820 Personal history of malignant melanoma of skin: Secondary | ICD-10-CM | POA: Diagnosis not present

## 2021-08-21 DIAGNOSIS — L814 Other melanin hyperpigmentation: Secondary | ICD-10-CM | POA: Diagnosis not present

## 2021-08-21 DIAGNOSIS — L570 Actinic keratosis: Secondary | ICD-10-CM | POA: Diagnosis not present

## 2021-09-06 DIAGNOSIS — N200 Calculus of kidney: Secondary | ICD-10-CM | POA: Diagnosis not present

## 2021-09-09 ENCOUNTER — Other Ambulatory Visit: Payer: Self-pay | Admitting: Urology

## 2021-09-10 ENCOUNTER — Other Ambulatory Visit (HOSPITAL_COMMUNITY): Payer: Self-pay | Admitting: Urology

## 2021-09-10 DIAGNOSIS — N200 Calculus of kidney: Secondary | ICD-10-CM

## 2021-09-11 NOTE — Telephone Encounter (Signed)
Patient will come by to purchase the ace wraps from front desk

## 2021-09-24 ENCOUNTER — Ambulatory Visit (INDEPENDENT_AMBULATORY_CARE_PROVIDER_SITE_OTHER): Payer: Medicare Other | Admitting: Internal Medicine

## 2021-09-24 ENCOUNTER — Encounter: Payer: Self-pay | Admitting: Internal Medicine

## 2021-09-24 VITALS — BP 128/88 | HR 86 | Temp 97.8°F | Ht 63.0 in | Wt 224.0 lb

## 2021-09-24 DIAGNOSIS — F5109 Other insomnia not due to a substance or known physiological condition: Secondary | ICD-10-CM

## 2021-09-24 DIAGNOSIS — Z01818 Encounter for other preprocedural examination: Secondary | ICD-10-CM

## 2021-09-24 DIAGNOSIS — Z6839 Body mass index (BMI) 39.0-39.9, adult: Secondary | ICD-10-CM

## 2021-09-24 DIAGNOSIS — Z8659 Personal history of other mental and behavioral disorders: Secondary | ICD-10-CM | POA: Diagnosis not present

## 2021-09-24 DIAGNOSIS — Z85820 Personal history of malignant melanoma of skin: Secondary | ICD-10-CM

## 2021-09-24 DIAGNOSIS — E119 Type 2 diabetes mellitus without complications: Secondary | ICD-10-CM | POA: Diagnosis not present

## 2021-09-24 DIAGNOSIS — I10 Essential (primary) hypertension: Secondary | ICD-10-CM

## 2021-09-24 DIAGNOSIS — Z86711 Personal history of pulmonary embolism: Secondary | ICD-10-CM

## 2021-09-24 DIAGNOSIS — R609 Edema, unspecified: Secondary | ICD-10-CM

## 2021-09-24 DIAGNOSIS — E039 Hypothyroidism, unspecified: Secondary | ICD-10-CM

## 2021-09-24 DIAGNOSIS — R7301 Impaired fasting glucose: Secondary | ICD-10-CM | POA: Diagnosis not present

## 2021-09-24 NOTE — Patient Instructions (Addendum)
Referral to Cardiology for pre-op evaluation. EKG unchanged from previous EKG. Serum creatinine will need close follow-up.  Likely elevated due to obstruction from staghorn calculus.  Has Medicare wellness visit scheduled here for January 2024.

## 2021-09-24 NOTE — Progress Notes (Signed)
Subjective:    Patient ID: Cassidy [name], female    DOB: November 11, 1940, 81 y.o.   MRN: [id number]  HPI 81 year old Female seen for pre-operative evaluation.She is to have left nephrolithotomy by Dr. Claudia Desanctis, Urologist on July 27.  Found on CT to have staghorn calculus left kidney.  Was having issues with recurrent urinary infections prior to being referred to urologist.  Does have some urge urinary incontinence and some nocturia.  Patient was having issues with ongoing urinary tract infections.  It was difficult for her to get a clean-catch urine here in this office.  I tried to do an in and out cath but it was not possible.  I could not get the quick cath in to her urethra or get any urine.  She was subsequently referred to Dr. Vikki Ports for evaluation.  She had cystoscopy which was essentially normal but CT showed a large staghorn calculus left kidney.  Does have some left kidney atrophy.  Renal functions have been normal.  She never has passed a stone and does not have any history of renal colic.  Had cystoscopy in April 2023 which was essentially normal.  She is here for preop evaluation.  Past medical history includes negative Cardiolite study done in April 2009 at [facility].  She is agreeable to seeing cardiologist again for preoperative evaluation.  She has no chest pain or shortness of breath but basically is sedentary.  History of total thyroidectomy February 2021 by Dr. Harlow Asa and is followed by Endocrinology.  Was found to have papillary thyroid carcinoma involving the left lobe of thyroid.  Margins were negative for tumor.  She is now on levothyroxine per Dr. Rosezella Rumpf.  History of ovarian cystectomy 1968, vaginal hysterectomy 1982, rectocele repair 2009, cholecystectomy 2003, right wrist ganglion cyst surgery 2008.  Left ankle surgery 2005 involving a bone graft.  Arthroscopic surgery for torn ligaments in right knee 2003.  Right knee arthroplasty July 2010 and subsequently  developed bilateral pulmonary emboli after that procedure.  Posterior colporrhaphy GYN surgery in 2009.  Left total knee arthroplasty by Dr. Mayer Camel August 2020.  In 2016 she had melanoma excised from her right foot.  At that time she had dysplastic nevus of her right shoulder for which she underwent a wide excision.  In March 2016 she developed redness and swelling as well as drainage from previously healed wounds on her right foot and ankle.  Was placed on antibiotics and DVT was ruled out.  She was seen at [facility], diagnosed with delayed surgical wound healing and treated there at the wound care center for several months.  In 2015 she had melanoma removed from right back requiring a wide excision.  Medical issues include postsurgical hypothyroidism, hypertension, osteopenia, hyperlipidemia, dependent edema, impaired glucose tolerance and obesity.  Reportedly had colonoscopy at [facility] in 2007.  Social history: She is divorced.  She resides alone.  Does not smoke.  Social alcohol consumption.  2 adult children, a son and a daughter.  3 granddaughters.  Family history: Father died at age 55 of a sudden MI.  Mother died of heart problems at age 73.  56 brother deceased in an airplane crash at age 83.  2 sisters in good health.  Daughter with history of breast cancer doing well.      Review of Systems history of urge urinary incontinence, history of hysterectomy, some nocturia.  No fever or chills.  No chest pain or  shortness of breath.     Objective:   Physical Exam Blood pressure 128/88, pulse 86 regular, temperature 97.8 degrees, pulse oximetry 95% weight 224 pounds height 5 feet 3 inches BMI 39.68 Skin: Warm and dry.  No cervical adenopathy, masses or carotid bruits.  Chest clear to auscultation.  Cardiac exam: Regular rate and rhythm without ectopy.  Abdomen is soft obese nondistended without hepatosplenomegaly masses or tenderness.   Trace lower extremity edema.  Neuro is intact without gross focal deficits.      Assessment & Plan:  Status post thyroidectomy in 2021 by Dr. Harlow Asa for papillary thyroid carcinoma.  Followed by Dr. Kelton Pillar.  Currently TSH is 3.24.  Patient is currently on levothyroxine 200 mcg daily.  History of dependent edema treated with Lasix 40 mg daily  History of hypertension treated with amlodipine 5 mg daily  History of anxiety treated with Xanax up to twice daily as needed  Remote history of melanoma  Staghorn calculus left kidney-scheduled for left nephrolithotomy by Dr. Claudia Desanctis in late July  History of recurrent urinary infections likely due to staghorn calculus  Elevated serum creatinine-creatinine 1.20 and 4 months ago was 1.09.  May be due to staghorn calculus/obstruction.  This will need close follow-up.  Plan: Patient is agreeable to Cardiology evaluation prior to surgery and we will make referral.  EKG obtained and has left axis deviation.

## 2021-09-25 LAB — CBC WITH DIFFERENTIAL/PLATELET
Absolute Monocytes: 443 cells/uL (ref 200–950)
Basophils Absolute: 89 cells/uL (ref 0–200)
Basophils Relative: 1.5 %
Eosinophils Absolute: 242 cells/uL (ref 15–500)
Eosinophils Relative: 4.1 %
HCT: 43.1 % (ref 35.0–45.0)
Hemoglobin: 14.1 g/dL (ref 11.7–15.5)
Lymphs Abs: 2130 cells/uL (ref 850–3900)
MCH: 28.8 pg (ref 27.0–33.0)
MCHC: 32.7 g/dL (ref 32.0–36.0)
MCV: 88 fL (ref 80.0–100.0)
MPV: 10.4 fL (ref 7.5–12.5)
Monocytes Relative: 7.5 %
Neutro Abs: 2997 cells/uL (ref 1500–7800)
Neutrophils Relative %: 50.8 %
Platelets: 262 10*3/uL (ref 140–400)
RBC: 4.9 10*6/uL (ref 3.80–5.10)
RDW: 13.7 % (ref 11.0–15.0)
Total Lymphocyte: 36.1 %
WBC: 5.9 10*3/uL (ref 3.8–10.8)

## 2021-09-25 LAB — COMPLETE METABOLIC PANEL WITH GFR
AG Ratio: 1.6 (calc) (ref 1.0–2.5)
ALT: 15 U/L (ref 6–29)
AST: 22 U/L (ref 10–35)
Albumin: 4.5 g/dL (ref 3.6–5.1)
Alkaline phosphatase (APISO): 100 U/L (ref 37–153)
BUN/Creatinine Ratio: 18 (calc) (ref 6–22)
BUN: 21 mg/dL (ref 7–25)
CO2: 26 mmol/L (ref 20–32)
Calcium: 9.5 mg/dL (ref 8.6–10.4)
Chloride: 103 mmol/L (ref 98–110)
Creat: 1.2 mg/dL — ABNORMAL HIGH (ref 0.60–0.95)
Globulin: 2.8 g/dL (calc) (ref 1.9–3.7)
Glucose, Bld: 105 mg/dL — ABNORMAL HIGH (ref 65–99)
Potassium: 4.5 mmol/L (ref 3.5–5.3)
Sodium: 139 mmol/L (ref 135–146)
Total Bilirubin: 0.6 mg/dL (ref 0.2–1.2)
Total Protein: 7.3 g/dL (ref 6.1–8.1)
eGFR: 45 mL/min/{1.73_m2} — ABNORMAL LOW (ref >=60)

## 2021-09-25 LAB — HEMOGLOBIN A1C
Hgb A1c MFr Bld: 5.6 % of total Hgb (ref <5.7)
Mean Plasma Glucose: 114 mg/dL
eAG (mmol/L): 6.3 mmol/L

## 2021-09-25 LAB — TSH: TSH: 3.24 mIU/L (ref 0.40–4.50)

## 2021-09-30 ENCOUNTER — Ambulatory Visit (INDEPENDENT_AMBULATORY_CARE_PROVIDER_SITE_OTHER): Payer: Medicare Other | Admitting: Podiatry

## 2021-09-30 DIAGNOSIS — Z7901 Long term (current) use of anticoagulants: Secondary | ICD-10-CM | POA: Diagnosis not present

## 2021-09-30 DIAGNOSIS — Q828 Other specified congenital malformations of skin: Secondary | ICD-10-CM | POA: Diagnosis not present

## 2021-09-30 DIAGNOSIS — B351 Tinea unguium: Secondary | ICD-10-CM

## 2021-09-30 DIAGNOSIS — M79676 Pain in unspecified toe(s): Secondary | ICD-10-CM | POA: Diagnosis not present

## 2021-09-30 NOTE — Progress Notes (Signed)
Patient ID: Cassidy [name], female   DOB: [birth year], 81 y.o.   MRN: [id number]  Subjective: Cassidy [name], 81 year old female presents the office today for concerns of thick painful toenails and calluses to both of her feet as well as calluses.  No open lesions.   She was able to get the ACE bandages to wrap the legs and it has been helping.   Objective: AAO 3, NAD DP/PT pulses are palpable, CRT less than 3 seconds  Chronic edema present bilaterally Protective sensation absent with Derrel Nip monofilament Thick hyperkeratotic tissue plantar aspect right hallux with dried blood.  There is no underlying ulceration.  Hyperkeratotic lesion also noted submetatarsal left foot as well as right hallux.  No underlying ulceration. Nails are hypertrophic, dystrophic, brittle, discolored, elongated 9. No surrounding redness or drainage. Tenderness nails 1-5 bilaterally except for left second toe which has been amputated. No pain with calf compression, swelling, warmth, erythema.  Assessment: 81 year old female with pre-ulcerative calluses bilaterally, symptomatic onychomycosis  Plan: -Treatment options discussed including all alternatives, risks, and complications -Nail sharply debrided 9 without any complications or bleeding -Hyperkeratotic lesion sharply debrided 4 without complications. Monitor for any skin breakdown. Continue offloading. Moisturizer  -Continue to wrap the legs with the Ace bandages to help with the compression. -Discussed the importance of daily foot inspection -Follow-up as scheduled or sooner if any problems arise. In the meantime, encouraged to call the office with any questions, concerns, change in symptoms.   Celesta Gentile, DPM

## 2021-10-02 ENCOUNTER — Telehealth: Payer: Self-pay | Admitting: Internal Medicine

## 2021-10-02 NOTE — Telephone Encounter (Signed)
This message was sent via [place], a product from [employer] http://www.bis[employer].com/                    -------Fax Transmission Report-------  To:               Recipient at [phone] Subject:          FW: Hp Scans Result:           The transmission was successful. Explanation:      All Pages Ok Pages Sent:       18 Connect Time:     9 minutes, 39 seconds Transmit Time:    10/02/2021 11:34 Transfer Rate:    14400 Status Code:      0000 Retry Count:      0 Job Id:           [id number] Unique Id:        [id number] Fax Line:         18 Fax Server:       [employer]

## 2021-10-02 NOTE — Telephone Encounter (Signed)
Faxed Surgery Clearance to [facility] [phone], Phone [phone] ext [phone]

## 2021-10-03 ENCOUNTER — Telehealth (HOSPITAL_BASED_OUTPATIENT_CLINIC_OR_DEPARTMENT_OTHER): Payer: Self-pay | Admitting: Cardiovascular Disease

## 2021-10-03 NOTE — Telephone Encounter (Signed)
Patient has a new patient appointment with Dr. Oval Linsey on 11/06/21. We do not have a complete history on this patient so we are unable to address the request to hold aspirin until the time of her appointment.  Emmaline Life, NP-C    10/03/2021, 10:18 AM [place] [address], [address] Office [phone] Fax [phone]

## 2021-10-03 NOTE — Telephone Encounter (Signed)
Please see below.

## 2021-10-03 NOTE — Telephone Encounter (Signed)
   Pre-operative Risk Assessment    Patient Name: Cassidy [name]  DOB: October 26, 1940 MRN: [id number]      Request for Surgical Clearance    Procedure:   percutaneous ephrolithotomy   Date of Surgery:  Clearance 11/12/21                                 Surgeon:  Dr. Jacalyn Lefevre  Surgeon's Group or Practice Name:  [facility]  Phone number:  [phone] Fax number:  [phone]   Type of Clearance Requested:   - Medical  - Pharmacy:  Hold Aspirin 5 days prior    Type of Anesthesia:  General    Additional requests/questions:      SignedMilbert Coulter   10/03/2021, 9:01 AM

## 2021-10-04 ENCOUNTER — Ambulatory Visit (HOSPITAL_BASED_OUTPATIENT_CLINIC_OR_DEPARTMENT_OTHER): Payer: Medicare Other | Admitting: Cardiovascular Disease

## 2021-10-10 NOTE — Progress Notes (Signed)
Patient states that she will get it soon. Advised we needed prior to surgery for clearance.

## 2021-10-14 ENCOUNTER — Ambulatory Visit
Admission: RE | Admit: 2021-10-14 | Discharge: 2021-10-14 | Disposition: A | Discharge status: Home / Self Care | Payer: Medicare Other | Source: Ambulatory Visit | Attending: Internal Medicine | Admitting: Internal Medicine

## 2021-10-14 DIAGNOSIS — Z87442 Personal history of urinary calculi: Secondary | ICD-10-CM | POA: Diagnosis not present

## 2021-10-14 DIAGNOSIS — I77810 Thoracic aortic ectasia: Secondary | ICD-10-CM | POA: Diagnosis not present

## 2021-10-14 DIAGNOSIS — Z01818 Encounter for other preprocedural examination: Secondary | ICD-10-CM | POA: Diagnosis not present

## 2021-10-28 DIAGNOSIS — N200 Calculus of kidney: Secondary | ICD-10-CM | POA: Diagnosis not present

## 2021-10-28 NOTE — Patient Instructions (Signed)
DUE TO COVID-19 ONLY TWO VISITORS  (aged 81 and older)  ARE ALLOWED TO COME WITH YOU AND STAY IN THE WAITING ROOM ONLY DURING PRE OP AND PROCEDURE.   **NO VISITORS ARE ALLOWED IN THE SHORT STAY AREA OR RECOVERY ROOM!!**  IF YOU WILL BE ADMITTED INTO THE HOSPITAL YOU ARE ALLOWED ONLY FOUR SUPPORT PEOPLE DURING VISITATION HOURS ONLY (7 AM -8PM)   The support person(s) must pass our screening, gel in and out, and wear a mask at all times, including in the patient's room. Patients must also wear a mask when staff or their support person are in the room. Visitors GUEST BADGE MUST BE WORN VISIBLY  One adult visitor may remain with you overnight and MUST be in the room by 8 P.M.     Your procedure is scheduled on: 11/12/21   Report to [facility] Main Entrance    Report to admitting at   7:30 AM   Call this number if you have problems the morning of surgery [phone]   Do not eat food  or drink:After Midnight.            If you have questions, please contact your surgeon's office.     Oral Hygiene is also important to reduce your risk of infection.                                    Remember - BRUSH YOUR TEETH THE MORNING OF SURGERY WITH YOUR REGULAR TOOTHPASTE    Take these medicines the morning of surgery with A SIP OF WATER: Amlodipine, Levothyroxine   Bring CPAP mask and tubing day of surgery.                              You may not have any metal on your body including hair pins, jewelry, and body piercing             Do not wear make-up, lotions, powders, perfumes/cologne, or deodorant  Do not wear nail polish including gel and S&S, artificial/acrylic nails, or any other type of covering on natural nails including finger and toenails. If you have artificial nails, gel coating, etc. that needs to be removed by a nail salon please have this removed prior to surgery or surgery may need to be canceled/ delayed if the surgeon/ anesthesia feels like they are unable to be  safely monitored.   Do not shave  48 hours prior to surgery.     Do not bring valuables to the hospital. [place].   Contacts, dentures or bridgework may not be worn into surgery.   Bring small overnight bag day of surgery.   DO NOT [place]. PHARMACY WILL DISPENSE MEDICATIONS LISTED ON YOUR MEDICATION LIST TO YOU DURING YOUR ADMISSION [place]!       Special Instructions: Bring a copy of your healthcare power of attorney and living will documents  the day of surgery if you haven't scanned them before.              Please read over the following fact sheets you were given: IF YOU HAVE QUESTIONS ABOUT YOUR PRE-OP INSTRUCTIONS PLEASE CALL [facility] - Preparing  for Surgery Before surgery, you can play an important role.  Because skin is not sterile, your skin needs to be as free of germs as possible.  You can reduce the number of germs on your skin by washing with CHG (chlorahexidine gluconate) soap before surgery.  CHG is an antiseptic cleaner which kills germs and bonds with the skin to continue killing germs even after washing. Please DO NOT use if you have an allergy to CHG or antibacterial soaps.  If your skin becomes reddened/irritated stop using the CHG and inform your nurse when you arrive at Short Stay. Do not shave (including legs and underarms) for at least 48 hours prior to the first CHG shower.  Please follow these instructions carefully:  1.  Shower with CHG Soap the night before surgery and the  morning of Surgery.  2.  If you choose to wash your hair, wash your hair first as usual with your  normal  shampoo.  3.  After you shampoo, rinse your hair and body thoroughly to remove the  shampoo.                            4.  Use CHG as you would any other liquid soap.  You can apply chg directly  to the skin and wash                       Gently with a scrungie or  clean washcloth.  5.  Apply the CHG Soap to your body ONLY FROM THE NECK DOWN.   Do not use on face/ open                           Wound or open sores. Avoid contact with eyes, ears mouth and genitals (private parts).                       Wash face,  Genitals (private parts) with your normal soap.             6.  Wash thoroughly, paying special attention to the area where your surgery  will be performed.  7.  Thoroughly rinse your body with warm water from the neck down.  8.  DO NOT shower/wash with your normal soap after using and rinsing off  the CHG Soap.                9.  Pat yourself dry with a clean towel.            10.  Wear clean pajamas.            11.  Place clean sheets on your bed the night of your first shower and do not  sleep with pets. Day of Surgery : Do not apply any lotions/deodorants the morning of surgery.  Please wear clean clothes to the hospital/surgery center.  FAILURE TO FOLLOW THESE INSTRUCTIONS MAY RESULT IN THE CANCELLATION OF YOUR SURGERY   ________________________________________________________________________

## 2021-10-31 ENCOUNTER — Encounter (HOSPITAL_COMMUNITY)
Admission: RE | Admit: 2021-10-31 | Discharge: 2021-10-31 | Disposition: A | Discharge status: Home / Self Care | Payer: Medicare Other | Source: Ambulatory Visit | Attending: Urology | Admitting: Urology

## 2021-10-31 ENCOUNTER — Other Ambulatory Visit: Payer: Self-pay

## 2021-10-31 ENCOUNTER — Encounter (HOSPITAL_COMMUNITY): Payer: Self-pay

## 2021-10-31 DIAGNOSIS — I10 Essential (primary) hypertension: Secondary | ICD-10-CM | POA: Diagnosis not present

## 2021-10-31 DIAGNOSIS — Z01812 Encounter for preprocedural laboratory examination: Secondary | ICD-10-CM | POA: Insufficient documentation

## 2021-10-31 LAB — BASIC METABOLIC PANEL
Anion gap: 8 (ref 5–15)
BUN: 20 mg/dL (ref 8–23)
CO2: 26 mmol/L (ref 22–32)
Calcium: 9.1 mg/dL (ref 8.9–10.3)
Chloride: 105 mmol/L (ref 98–111)
Creatinine, Ser: 1.03 mg/dL — ABNORMAL HIGH (ref 0.44–1.00)
GFR, Estimated: 55 mL/min — ABNORMAL LOW (ref >60)
Glucose, Bld: 103 mg/dL — ABNORMAL HIGH (ref 70–99)
Potassium: 4.3 mmol/L (ref 3.5–5.1)
Sodium: 139 mmol/L (ref 135–145)

## 2021-10-31 LAB — CBC
HCT: 43.8 % (ref 36.0–46.0)
Hemoglobin: 14 g/dL (ref 12.0–15.0)
MCH: 29.4 pg (ref 26.0–34.0)
MCHC: 32 g/dL (ref 30.0–36.0)
MCV: 91.8 fL (ref 80.0–100.0)
Platelets: 230 10*3/uL (ref 150–400)
RBC: 4.77 MIL/uL (ref 3.87–5.11)
RDW: 14.8 % (ref 11.5–15.5)
WBC: 5.9 10*3/uL (ref 4.0–10.5)
nRBC: 0 % (ref 0.0–0.2)

## 2021-10-31 NOTE — Progress Notes (Signed)
Anesthesia note:  Bowel prep reminder:NA  PCP - Dr. Ezzard Flax Cardiologist -Dr. Berneice Gandy net appointment 11/06/21 Other-   Chest x-ray - 10/14/21-epic EKG - 09/24/21-epic Stress Test - no ECHO - no Cardiac Cath - no  Pacemaker/ICD device last checked:NA  Sleep Study - no CPAP -   Pt is pre diabetic-NA Fasting Blood Sugar -  Checks Blood Sugar _____  Blood Thinner:NA Blood Thinner Instructions: Aspirin Instructions: Last Dose:  Anesthesia review: yes  Patient denies shortness of breath, fever, cough and chest pain at PAT appointment Pt has no SOB with activities.   Patient verbalized understanding of instructions that were given to them at the PAT appointment. Patient was also instructed that they will need to review over the PAT instructions again at home before surgery. yes

## 2021-11-01 ENCOUNTER — Other Ambulatory Visit (HOSPITAL_COMMUNITY): Payer: Self-pay

## 2021-11-06 ENCOUNTER — Encounter (HOSPITAL_BASED_OUTPATIENT_CLINIC_OR_DEPARTMENT_OTHER): Payer: Self-pay | Admitting: Cardiology

## 2021-11-06 ENCOUNTER — Telehealth (HOSPITAL_COMMUNITY): Payer: Self-pay | Admitting: *Deleted

## 2021-11-06 ENCOUNTER — Telehealth (HOSPITAL_COMMUNITY): Payer: Self-pay

## 2021-11-06 ENCOUNTER — Encounter (HOSPITAL_BASED_OUTPATIENT_CLINIC_OR_DEPARTMENT_OTHER): Payer: Self-pay | Admitting: Cardiovascular Disease

## 2021-11-06 ENCOUNTER — Ambulatory Visit (INDEPENDENT_AMBULATORY_CARE_PROVIDER_SITE_OTHER): Payer: Medicare Other | Admitting: Cardiovascular Disease

## 2021-11-06 VITALS — BP 128/74 | HR 90 | Ht 65.0 in | Wt 228.4 lb

## 2021-11-06 DIAGNOSIS — E7800 Pure hypercholesterolemia, unspecified: Secondary | ICD-10-CM | POA: Diagnosis not present

## 2021-11-06 DIAGNOSIS — Z01810 Encounter for preprocedural cardiovascular examination: Secondary | ICD-10-CM | POA: Diagnosis not present

## 2021-11-06 DIAGNOSIS — I10 Essential (primary) hypertension: Secondary | ICD-10-CM | POA: Diagnosis not present

## 2021-11-06 DIAGNOSIS — R0602 Shortness of breath: Secondary | ICD-10-CM | POA: Diagnosis not present

## 2021-11-06 DIAGNOSIS — Z79899 Other long term (current) drug therapy: Secondary | ICD-10-CM

## 2021-11-06 HISTORY — DX: Pure hypercholesterolemia, unspecified: E78.00

## 2021-11-06 HISTORY — DX: Shortness of breath: R06.02

## 2021-11-06 HISTORY — DX: Encounter for preprocedural cardiovascular examination: Z01.810

## 2021-11-06 NOTE — Assessment & Plan Note (Signed)
Cassidy [name] is scheduled to undergo surgery for her kidney stone that will require general anesthesia.  She is unable to achieve 4 METS of activity without shortness of breath.  She has no chest pain.  We will get an exercise Myoview to rule out ischemia.  She does have a history of postoperative DVT.  She self discontinued her aspirin.  Given that she is only 5 days from surgery, we will not have her restart it now.  Recommend ambulation as soon as possible after surgery.  Would consider anticoagulation for 7-10 days postoperatively with Lovenox if this is deemed to be an option by the urologic team.

## 2021-11-06 NOTE — Telephone Encounter (Signed)
Close encounter 

## 2021-11-06 NOTE — Assessment & Plan Note (Signed)
Lipids are elevated.  ASCVD 10 year risk is 31%.  She wants to work on diet and exercise for 3 months.  Will repeat.  If we don't see improvement she will consider a statin.

## 2021-11-06 NOTE — Addendum Note (Signed)
Addended by: Meryl Crutch on: 11/06/2021 10:49 AM   Modules accepted: Orders

## 2021-11-06 NOTE — Addendum Note (Signed)
Addended by: Skeet Latch C on: 11/06/2021 12:45 PM   Modules accepted: Orders

## 2021-11-06 NOTE — Telephone Encounter (Signed)
Spoke with the patient, detailed instructions left with the patient. She stated that she would be here for here test. Asked to cal back with any questions. S.Manav Pierotti EMTP

## 2021-11-06 NOTE — Assessment & Plan Note (Addendum)
Blood pressure has been generally controlled on amlodipine.  It was initially elevated but better on repeat.  Continue amlodipine.  She will keep working on diet and exercise.

## 2021-11-06 NOTE — Progress Notes (Signed)
Cardiology Office Note   Date:  11/06/2021   ID:  Cassidy [name], DOB 05-19-1940, MRN [id number]  PCP:  Cassidy [name], Cassidy [name]  Cardiologist:   Cassidy [name], Cassidy [name]   No chief complaint on file.   History of Present Illness: Cassidy [name] is a 81 y.o. female with obesity, hypertension, pre-diabetes, papillary thyroid cancer status post thyroidectomy, prior pulmonary embolism, hyperlipidemia, and remote melanoma who is being seen today for the evaluation of preoperative risk assessment at the request of Cassidy [name], Cassidy [name], Cassidy [name].  She has had recurrent urinary tract infections due to staghorn calculi.  She saw Dr. Renold [name] for preoperative risk assessment.  EKG 09/2021 revealed sinus rhythm with LAFB.  She was referred to cardiology for preoperative risk assessment.  She is scheduled to have percutaneous ephrolithotomy with general anesthesia on 11/12/2021.  They have requested that she hold aspirin for 5 days preoperatively.  She notes that she doesn't get much exercise.  She gets  short of breath walking long distance if she doesn't use a cane.  She has no chest pain or pressure.  BP at home is around 138/80.  She tries to limit her sodium but does eat out some.  She eats a lot of salads.  She had knee replacement about 10 years ago and had post-operative DVT/PE.  She stopped taking her aspirin one month ago due to concern about her upcoming surgery.  She struggles with lower extremity edema and chronically wraps her legs.  This is due to known venous insufficiency.  She has no orthopnea or PND.  Past Medical History:  Diagnosis Date   Anxiety    Arthritis    Cancer ([place])    right foot,top of arm, lympth nodes removed   Dependent edema    DVT (deep venous thrombosis) (HCC)    Fatty liver    Heart murmur    occ   High risk surgery, pre-operative cardiovascular examination 11/06/2021   HTN (hypertension), benign    Hyperlipidemia    Hypothyroidism    Lymphedema    bi lat leggs   Melanoma  ([place])    Migraine 08/27/2010   Osteopenia    PE (pulmonary embolism) 2010   bilateral hx post op knee replacement   PONV (postoperative nausea and vomiting)    Pure hypercholesterolemia 11/06/2021   Shortness of breath 11/06/2021    Past Surgical History:  Procedure Laterality Date   ABDOMINAL HYSTERECTOMY     age 41   AMPUTATION  11/10/2011   Procedure: AMPUTATION RAY;  Surgeon: Cassidy [name], Cassidy [name];  Location: [place];  Service: Orthopedics;  Laterality: Left;  LEFT 2ND TOE TRANS MIDDLE PHALANX AMPUTATION    ANKLE FUSION  2005   lt-fusion   CHOLECYSTECTOMY  02/14/2002   COLONOSCOPY     GANGLION CYST EXCISION  2008   Rt wrist   JOINT REPLACEMENT  2010   rt total knee   KNEE ARTHROSCOPY Right 09/2001   Rt knee   OVARIAN CYST SURGERY Left    age 70 and appendectomy   RECTOCELE REPAIR  2009   REDUCTION MAMMAPLASTY Bilateral    THYROIDECTOMY N/A 06/17/2019   Procedure: TOTAL THYROIDECTOMY;  Surgeon: Cassidy [name], Cassidy [name];  Location: [facility];  Service: General;  Laterality: N/A;   TOTAL KNEE ARTHROPLASTY Left 12/06/2018   Procedure: Left Knee Arthroplasty;  Surgeon: Cassidy [name], Cassidy [name];  Location[facility];  Service: Orthopedics;  Laterality: Left;   UPPER GI ENDOSCOPY  Current Outpatient Medications  Medication Sig Dispense Refill   acetaminophen (TYLENOL) 325 MG tablet Take 650 mg by mouth every 6 (six) hours as needed for moderate pain or headache.     ALPRAZolam (XANAX) 0.25 MG tablet Take 1 tablet (0.25 mg total) by mouth at bedtime as needed for anxiety. 30 tablet 1   amLODipine (NORVASC) 5 MG tablet TAKE ONE TABLET DAILY 90 tablet 1   furosemide (LASIX) 40 MG tablet TAKE ONE TABLET DAILY 90 tablet 3   levothyroxine (SYNTHROID) 200 MCG tablet Take 1 tablet (200 mcg total) by mouth as directed. 1.5 tablets on Sunday, 1 tablet daily Monday through Saturday 98 tablet 3   promethazine (PHENERGAN) 25 MG tablet Take 1 tablet (25 mg total) by mouth every 8 (eight) hours  as needed for nausea or vomiting. 20 tablet 0   trimethoprim (TRIMPEX) 100 MG tablet Take 100 mg by mouth in the morning and at bedtime.     No current facility-administered medications for this visit.    Allergies:   Patient has no known allergies.    Social History:  The patient  reports that she has never smoked. She has never used smokeless tobacco. She reports that she does not drink alcohol and does not use drugs.   Family History:  The patient's family history includes Breast cancer (age of onset: 73) in her daughter; Diabetes in her brother; Heart attack in her brother; Heart attack (age of onset: 46) in her father; Heart disease in her brother, father, and mother.    ROS:  Please see the history of present illness.   Otherwise, review of systems are positive for none.   All other systems are reviewed and negative.    PHYSICAL EXAM: VS:  BP 128/74   Pulse 90   Ht '5\' 5"'$  (1.651 m)   Wt 228 lb 6.4 oz (103.6 kg)   BMI 38.01 kg/m  , BMI Body mass index is 38.01 kg/m. GENERAL:  Well appearing HEENT:  Pupils equal round and reactive, fundi not visualized, oral mucosa unremarkable NECK:  No jugular venous distention, waveform within normal limits, carotid upstroke brisk and symmetric, no bruits, no thyromegaly LUNGS:  Clear to auscultation bilaterally HEART:  RRR.  PMI not displaced or sustained,S1 and S2 within normal limits, no S3, no S4, no clicks, no rubs, no murmurs ABD:  Flat, positive bowel sounds normal in frequency in pitch, no bruits, no rebound, no guarding, no midline pulsatile mass, no hepatomegaly, no splenomegaly EXT:  2 plus pulses throughout, bilateral lymphedema, no cyanosis no clubbing SKIN:  No rashes no nodules NEURO:  Cranial nerves II through XII grossly intact, motor grossly intact throughout PSYCH:  Cognitively intact, oriented to person place and time  EKG:  EKG is ordered today. The ekg ordered today demonstrates sinus rhythm.  Rate 90 bpm.  Low  voltage.   Recent Labs: 09/24/2021: ALT 15; TSH 3.24 10/31/2021: BUN 20; Creatinine, Ser 1.03; Hemoglobin 14.0; Platelets 230; Potassium 4.3; Sodium 139    Lipid Panel    Component Value Date/Time   CHOL 208 (H) 05/14/2021 1152   TRIG 155 (H) 05/14/2021 1152   HDL 53 05/14/2021 1152   CHOLHDL 3.9 05/14/2021 1152   VLDL 20 11/17/2016 1013   LDLCALC 128 (H) 05/14/2021 1152      Wt Readings from Last 3 Encounters:  11/06/21 228 lb 6.4 oz (103.6 kg)  10/31/21 223 lb (101.2 kg)  09/24/21 224 lb (101.6 kg)  ASSESSMENT AND PLAN:  Hypertension Blood pressure has been generally controlled on amlodipine.  It was initially elevated but better on repeat.  Continue amlodipine.  She will keep working on diet and exercise.   Shortness of breath She has exertional dyspnea with walking.  I suspect this is due to deconditioning and obesity.  Given that she cant achieve 4 METS of activity we will get an exercise Myoview prior to surgery.  Pure hypercholesterolemia Lipids are elevated.  ASCVD 10 year risk is 31%.  She wants to work on diet and exercise for 3 months.  Will repeat.  If we don't see improvement she will consider a statin.  High risk surgery, pre-operative cardiovascular examination Ms. Bisesi is scheduled to undergo surgery for her kidney stone that will require general anesthesia.  She is unable to achieve 4 METS of activity without shortness of breath.  She has no chest pain.  We will get an exercise Myoview to rule out ischemia.  She does have a history of postoperative DVT.  She self discontinued her aspirin.  Given that she is only 5 days from surgery, we will not have her restart it now.  Recommend ambulation as soon as possible after surgery.  Would consider anticoagulation for 7-10 days postoperatively with Lovenox if this is deemed to be an option by the urologic team.    Current medicines are reviewed at length with the patient today.  The patient does not have  concerns regarding medicines.  The following changes have been made:  no change  Labs/ tests ordered today include:   Orders Placed This Encounter  Procedures   Lipid panel   Comprehensive metabolic panel   MYOCARDIAL PERFUSION IMAGING   EKG 12-Lead     Disposition:   FU with Cassidy Joswick C. Oval Linsey, Cassidy [name], Cassidy [name] in 3 months     Signed, Cassidy Heninger C. Oval Linsey, Cassidy [name], [facility]  11/06/2021 9:33 AM    [place]

## 2021-11-06 NOTE — Patient Instructions (Signed)
Medication Instructions:  Your Physician recommend you continue on your current medication as directed.    *If you need a refill on your cardiac medications before your next appointment, please call your pharmacy*   Lab Work: Your provider has recommended lab work in October, 2023 (Lipid, CMP). Please have this collected at [facility]. The lab is open 8:00 am - 4:30 pm. Please avoid 12:00p - 1:00p for lunch hour. You do not need an appointment. Please go to [address] [place], [place] [address]. This is in the Primary Care office on the 3rd floor, let them know you are there for blood work and they will direct you to the lab.   If you have labs (blood work) drawn today and your tests are completely normal, you will receive your results only by: [place] (if you have MyChart) OR A paper copy in the mail If you have any lab test that is abnormal or we need to change your treatment, we will call you to review the results.   Testing/Procedures: Your physician has requested that you have en exercise stress myoview. For further information please visit [email or web address]. Please follow instruction sheet, as given.  [place]. [address]  Follow-Up: At [facility], you and your health needs are our priority.  As part of our continuing mission to provide you with exceptional heart care, we have created designated Provider Care Teams.  These Care Teams include your primary Cardiologist (physician) and Advanced Practice Providers (APPs -  Physician Assistants and Nurse Practitioners) who all work together to provide you with the care you need, when you need it.  We recommend signing up for the patient portal called "MyChart".  Sign up information is provided on this After Visit Summary.  MyChart is used to connect with patients for Virtual Visits (Telemedicine).  Patients are able to view lab/test results, encounter notes, upcoming  appointments, etc.  Non-urgent messages can be sent to your provider as well.   To learn more about what you can do with MyChart, go to [email or web address].    Your next appointment:   3 month(s)  The format for your next appointment:   In Person  Provider:   Skeet Latch, MD{  You are scheduled for a Myocardial Perfusion Imaging Study.  Please arrive 15 minutes prior to your appointment time for registration and insurance purposes.  The test will take approximately 3 to 4 hours to complete; you may bring reading material.  If someone comes with you to your appointment, they will need to remain in the main lobby due to limited space in the testing area. **If you are pregnant or breastfeeding, please notify the nuclear lab prior to your appointment**  How to prepare for your Myocardial Perfusion Test: Do not eat or drink 3 hours prior to your test, except you may have water. Do not consume products containing caffeine (regular or decaffeinated) 12 hours prior to your test. (ex: coffee, chocolate, sodas, tea). Do bring a list of your current medications with you.  If not listed below, you may take your medications as normal. Do wear comfortable clothes (no dresses or overalls) and walking shoes, tennis shoes preferred (No heels or open toe shoes are allowed). Do NOT wear cologne, perfume, aftershave, or lotions (deodorant is allowed). If these instructions are not followed, your test will have to be rescheduled.  Please report to [place], [address] for your test.  If you have questions or  concerns about your appointment, you can call the Nuclear Lab at [phone].  If you cannot keep your appointment, please provide 24 hours notification to the Nuclear Lab, to avoid a possible $50 charge to your account.

## 2021-11-06 NOTE — Assessment & Plan Note (Signed)
She has exertional dyspnea with walking.  I suspect this is due to deconditioning and obesity.  Given that she cant achieve 4 METS of activity we will get an exercise Myoview prior to surgery.

## 2021-11-07 ENCOUNTER — Ambulatory Visit (HOSPITAL_COMMUNITY): Payer: Medicare Other

## 2021-11-07 ENCOUNTER — Ambulatory Visit (HOSPITAL_COMMUNITY)
Admission: RE | Admit: 2021-11-07 | Discharge: 2021-11-07 | Disposition: A | Discharge status: Home / Self Care | Payer: Medicare Other | Source: Ambulatory Visit | Attending: Cardiovascular Disease | Admitting: Cardiovascular Disease

## 2021-11-07 DIAGNOSIS — Z01810 Encounter for preprocedural cardiovascular examination: Secondary | ICD-10-CM | POA: Insufficient documentation

## 2021-11-07 MED ORDER — TECHNETIUM TC 99M TETROFOSMIN IV KIT
28.0000 | PACK | Freq: Once | INTRAVENOUS | Status: AC | PRN
  Administered 2021-11-07: 28 via INTRAVENOUS

## 2021-11-07 MED ORDER — REGADENOSON 0.4 MG/5ML IV SOLN
0.4000 mg | Freq: Once | INTRAVENOUS | Status: AC
  Administered 2021-11-07: 0.4 mg via INTRAVENOUS

## 2021-11-08 ENCOUNTER — Ambulatory Visit (HOSPITAL_COMMUNITY)
Admission: RE | Admit: 2021-11-08 | Discharge: 2021-11-08 | Disposition: A | Discharge status: Home / Self Care | Payer: Medicare Other | Source: Ambulatory Visit | Attending: Cardiovascular Disease | Admitting: Cardiovascular Disease

## 2021-11-08 LAB — MYOCARDIAL PERFUSION IMAGING
Base ST Depression (mm): 0 mm
LV dias vol: 101 mL (ref 46–106)
LV sys vol: 38 mL
Nuc Stress EF: 62 %
Peak HR: 102 {beats}/min
Rest HR: 98 {beats}/min
Rest Nuclear Isotope Dose: 31.2 mCi
SDS: 1
SRS: 2
SSS: 3
ST Depression (mm): 0 mm
Stress Nuclear Isotope Dose: 28 mCi
TID: 0.91

## 2021-11-08 MED ORDER — TECHNETIUM TC 99M TETROFOSMIN IV KIT
31.2000 | PACK | Freq: Once | INTRAVENOUS | Status: AC | PRN
  Administered 2021-11-08: 31.2 via INTRAVENOUS

## 2021-11-10 ENCOUNTER — Other Ambulatory Visit: Payer: Self-pay | Admitting: Radiology

## 2021-11-10 DIAGNOSIS — N200 Calculus of kidney: Secondary | ICD-10-CM

## 2021-11-11 ENCOUNTER — Other Ambulatory Visit: Payer: Self-pay | Admitting: Internal Medicine

## 2021-11-11 NOTE — H&P (Signed)
Chief Complaint: Patient was seen in consultation today for staghorn calculus at the request of Pace,Maryellen D  Referring Physician(s): Pace,Maryellen D  Supervising Physician: Mir, Sharen Heck  Patient Status: [facility] - Out-pt  History of Present Illness: Cassidy [name] is a 81 y.o. female with past medical history for anxiety, arthritis, dependent edema, DVT, fatty liver, heart murmur, HTN, HLD, hypothyroidism, melanoma, PE, PONV and shortness of breath.  Patient was referred to IR for left percutaneous nephrostomy tube placement for urological surgical intervention today to treat staghorn calculus at the request of Annett Gula, MD.   Past Medical History:  Diagnosis Date   Anxiety    Arthritis    Cancer ([place])    right foot,top of arm, lympth nodes removed   Dependent edema    DVT (deep venous thrombosis) ([place])    Fatty liver    Heart murmur    occ   High risk surgery, pre-operative cardiovascular examination 11/06/2021   HTN (hypertension), benign    Hyperlipidemia    Hypothyroidism    Lymphedema    bi lat leggs   Melanoma ([place])    Migraine 08/27/2010   Osteopenia    PE (pulmonary embolism) 2010   bilateral hx post op knee replacement   PONV (postoperative nausea and vomiting)    Pure hypercholesterolemia 11/06/2021   Shortness of breath 11/06/2021    Past Surgical History:  Procedure Laterality Date   ABDOMINAL HYSTERECTOMY     age 61   AMPUTATION  11/10/2011   Procedure: AMPUTATION RAY;  Surgeon: Kerin Salen, MD;  Location: [place];  Service: Orthopedics;  Laterality: Left;  LEFT 2ND TOE TRANS MIDDLE PHALANX AMPUTATION    ANKLE FUSION  2005   lt-fusion   CHOLECYSTECTOMY  02/14/2002   COLONOSCOPY     GANGLION CYST EXCISION  2008   Rt wrist   JOINT REPLACEMENT  2010   rt total knee   KNEE ARTHROSCOPY Right 09/2001   Rt knee   OVARIAN CYST SURGERY Left    age 71 and appendectomy   RECTOCELE REPAIR  2009   REDUCTION MAMMAPLASTY  Bilateral    THYROIDECTOMY N/A 06/17/2019   Procedure: TOTAL THYROIDECTOMY;  Surgeon: Armandina Gemma, MD;  [facility];  Service: General;  Laterality: N/A;   TOTAL KNEE ARTHROPLASTY Left 12/06/2018   Procedure: Left Knee Arthroplasty;  Surgeon: Frederik Pear, MD;  [facility];  Service: Orthopedics;  Laterality: Left;   UPPER GI ENDOSCOPY      Allergies: Patient has no known allergies.  Medications: Prior to Admission medications   Medication Sig Start Date End Date Taking? Authorizing Provider  acetaminophen (TYLENOL) 325 MG tablet Take 650 mg by mouth every 6 (six) hours as needed for moderate pain or headache.    [provider]  ALPRAZolam Duanne Moron) 0.25 MG tablet Take 1 tablet (0.25 mg total) by mouth at bedtime as needed for anxiety. 06/22/19   Elby Showers, MD  amLODipine (NORVASC) 5 MG tablet TAKE ONE TABLET DAILY 08/10/21   Elby Showers, MD  furosemide (LASIX) 40 MG tablet TAKE ONE TABLET DAILY 01/15/21   Elby Showers, MD  levothyroxine (SYNTHROID) 200 MCG tablet Take 1 tablet (200 mcg total) by mouth as directed. 1.5 tablets on Sunday, 1 tablet daily Monday through Saturday 07/17/21   Shamleffer, Melanie Crazier, MD  promethazine (PHENERGAN) 25 MG tablet Take 1 tablet (25 mg total) by mouth every 8 (eight) hours as needed for nausea or vomiting. 05/20/21  Elby Showers, MD  trimethoprim (TRIMPEX) 100 MG tablet Take 100 mg by mouth in the morning and at bedtime. 08/19/21   [provider]     Family History  Problem Relation Age of Onset   Heart disease Mother    Heart attack Father 82   Heart disease Father    Heart attack Brother    Diabetes Brother    Heart disease Brother    Breast cancer Daughter 82    Social History   Socioeconomic History   Marital status: Divorced    Spouse name: Not on file   Number of children: Not on file   Years of education: Not on file   Highest education level: Not on file  Occupational History   Not on file   Tobacco Use   Smoking status: Never   Smokeless tobacco: Never  Vaping Use   Vaping Use: Never used  Substance and Sexual Activity   Alcohol use: No   Drug use: No   Sexual activity: Never    Birth control/protection: Surgical  Other Topics Concern   Not on file  Social History Narrative   Social history: Divorced.  Resides alone.  Does not smoke.  Social alcohol consumption.  2 adult children, a son and a daughter.  Daughter with history of breast cancer doing well.  3 granddaughters in good health.       Family history: Father died at age 54 of a sudden MI.  Mother died at age 4 of heart problems.  1 brother deceased in an airplane crash at age 63.  2 sisters in good health.       Social Determinants of Health   Financial Resource Strain: Not on file  Food Insecurity: Not on file  Transportation Needs: Not on file  Physical Activity: Not on file  Stress: Not on file  Social Connections: Not on file    Review of Systems: A 12 point ROS discussed and pertinent positives are indicated in the HPI above.  All other systems are negative.  Review of Systems  Constitutional:  Negative for appetite change, chills and fever.  Respiratory:  Negative for shortness of breath.   Cardiovascular:  Negative for chest pain.  Gastrointestinal:  Negative for abdominal pain, nausea and vomiting.  Genitourinary:  Positive for flank pain. Negative for dysuria and hematuria.  Neurological:  Negative for dizziness and headaches.    Vital Signs: BP 127/89   Pulse 99   Temp 98.3 F (36.8 C) (Oral)   Resp 18   Ht '5\' 5"'$  (1.651 m)   Wt 228 lb (103.4 kg)   SpO2 97%   BMI 37.94 kg/m      Physical Exam Vitals reviewed.  Constitutional:      General: She is not in acute distress.    Appearance: Normal appearance. She is not ill-appearing.  HENT:     Head: Normocephalic and atraumatic.     Mouth/Throat:     Mouth: Mucous membranes are dry.     Pharynx: Oropharynx is clear.  Eyes:      Extraocular Movements: Extraocular movements intact.     Pupils: Pupils are equal, round, and reactive to light.  Cardiovascular:     Rate and Rhythm: Normal rate and regular rhythm.  Pulmonary:     Effort: Pulmonary effort is normal. No respiratory distress.     Breath sounds: Normal breath sounds.  Abdominal:     General: Bowel sounds are normal. There is no distension.  Palpations: Abdomen is soft.     Tenderness: There is no abdominal tenderness. There is no guarding.  Musculoskeletal:     Right lower leg: Edema present.     Left lower leg: Edema present.  Skin:    General: Skin is warm and dry.  Neurological:     Mental Status: She is alert and oriented to person, place, and time.  Psychiatric:        Mood and Affect: Mood normal.        Behavior: Behavior normal.        Thought Content: Thought content normal.        Judgment: Judgment normal.     Imaging: MYOCARDIAL PERFUSION IMAGING  Result Date: 11/08/2021   The study is normal. The study is low risk.   No ST deviation was noted.   LV perfusion is normal. There is no evidence of ischemia. There is no evidence of infarction.   Left ventricular function is normal. Nuclear stress EF: 62 %. The left ventricular ejection fraction is normal (55-65%). End diastolic cavity size is normal. End systolic cavity size is normal.   Prior study not available for comparison.   DG Chest 2 View  Result Date: 10/14/2021 CLINICAL DATA:  preop for kidney stone surgery EXAM: CHEST - 2 VIEW COMPARISON:  None Available. FINDINGS: The heart size and mediastinal contours are stable. Heart size upper normal. Thoracic aorta is ectatic. Both lungs are clear. The visualized skeletal structures are unremarkable. IMPRESSION: No active cardiopulmonary disease. Electronically Signed   By: Frazier Richards M.D.   On: 10/14/2021 11:03    Labs:  CBC: Recent Labs    05/14/21 1152 09/24/21 1222 10/31/21 0901 11/12/21 0800  WBC 7.0 5.9 5.9 5.7  HGB  14.4 14.1 14.0 13.8  HCT 43.7 43.1 43.8 43.2  PLT 256 262 230 186    COAGS: Recent Labs    11/12/21 0800  INR 1.1    BMP: Recent Labs    05/14/21 1152 09/24/21 1222 10/31/21 0901 11/12/21 0800  NA 141 139 139 140  K 4.0 4.5 4.3 4.0  CL 101 103 105 107  CO2 34* '26 26 25  '$ GLUCOSE 108* 105* 103* 103*  BUN '14 21 20 13  '$ CALCIUM 9.4 9.5 9.1 9.0  CREATININE 1.09* 1.20* 1.03* 1.01*  GFRNONAA  --   --  55* 56*    LIVER FUNCTION TESTS: Recent Labs    05/14/21 1152 09/24/21 1222  BILITOT 0.8 0.6  AST 18 22  ALT 16 15  PROT 7.6 7.3    TUMOR MARKERS: No results for input(s): "AFPTM", "CEA", "CA199", "CHROMGRNA" in the last 8760 hours.  Assessment and Plan: History of anxiety, arthritis, dependent edema, DVT, fatty liver, heart murmur, HTN, HLD, hypothyroidism, melanoma, PE, PONV and shortness of breath.  Patient was referred to IR for left percutaneous nephrostomy tube placement for urological surgical intervention today to treat staghorn calculus at the request of Annett Gula, MD.   Pt resting on stretcher.  She is A&O, calm and pleasant. She is in no distress.  Pt is NPO per order.  Risks and benefits of left PCN placement for OR procedure for staghorn calculus was discussed with the patient including, but not limited to, infection, bleeding, significant bleeding causing loss or decrease in renal function or damage to adjacent structures.   All of the patient's questions were answered, patient is agreeable to proceed.  Consent signed and in chart.   Thank you for this interesting  consult.  I greatly enjoyed meeting [name] and look forward to participating in their care.  A copy of this report was sent to the requesting provider on this date.  Electronically Signed: Tyson Alias, NP   11/12/2021, 9:20 AM   I spent a total of 20 minutes in face to face in clinical consultation, greater than 50% of which was counseling/coordinating care for staghorn  calculus.

## 2021-11-11 NOTE — Anesthesia Preprocedure Evaluation (Signed)
Anesthesia Evaluation  Patient identified by MRN, date of birth, ID band Patient awake    Reviewed: Allergy & Precautions, NPO status , Patient's Chart, lab work & pertinent test results  History of Anesthesia Complications (+) PONV, DIFFICULT AIRWAY and history of anesthetic complications (usually just some nausea postop, has gotten better as she has aged)  Airway Mallampati: III  TM Distance: >3 FB Neck ROM: Full    Dental  (+) Teeth Intact, Dental Advisory Given   Pulmonary PE (2010)   Pulmonary exam normal breath sounds clear to auscultation       Cardiovascular hypertension (127/89 in preop), Pt. on medications Normal cardiovascular exam Rhythm:Regular Rate:Normal  Stress test 2023 .  The study is normal. The study is low risk. .  No ST deviation was noted. .  LV perfusion is normal. There is no evidence of ischemia. There is no evidence of infarction. .  Left ventricular function is normal. Nuclear stress EF: 62 %. The left ventricular ejection fraction is normal (55-65%). End diastolic cavity size is normal. End systolic cavity size is normal. .  Prior study not available for comparison.    Neuro/Psych  Headaches, PSYCHIATRIC DISORDERS Anxiety    GI/Hepatic negative GI ROS, Neg liver ROS,   Endo/Other  Hypothyroidism BMI 37  Renal/GU L renal calculus, Cr 1.03  negative genitourinary   Musculoskeletal  (+) Arthritis , Osteoarthritis,    Abdominal (+) + obese,   Peds  Hematology negative hematology ROS (+) Hb 14, plt 230   Anesthesia Other Findings   Reproductive/Obstetrics negative OB ROS                           Anesthesia Physical Anesthesia Plan  ASA: 2  Anesthesia Plan: General   Post-op Pain Management: Tylenol PO (pre-op)*   Induction: Intravenous  PONV Risk Score and Plan: 4 or greater and Ondansetron, Dexamethasone and Treatment may vary due to age or medical  condition  Airway Management Planned: Oral ETT  Additional Equipment: None  Intra-op Plan:   Post-operative Plan: Extubation in OR  Informed Consent: I have reviewed the patients History and Physical, chart, labs and discussed the procedure including the risks, benefits and alternatives for the proposed anesthesia with the patient or authorized representative who has indicated his/her understanding and acceptance.     Dental advisory given  Plan Discussed with: CRNA  Anesthesia Plan Comments:        Anesthesia Quick Evaluation

## 2021-11-12 ENCOUNTER — Inpatient Hospital Stay (HOSPITAL_COMMUNITY)
Admission: RE | Admit: 2021-11-12 | Discharge: 2021-11-14 | DRG: 661 | Disposition: A | Discharge status: Home / Self Care | Payer: Medicare Other | Attending: Urology | Admitting: Urology

## 2021-11-12 ENCOUNTER — Encounter (HOSPITAL_COMMUNITY): Payer: Self-pay | Admitting: Urology

## 2021-11-12 ENCOUNTER — Other Ambulatory Visit: Payer: Self-pay

## 2021-11-12 ENCOUNTER — Ambulatory Visit (HOSPITAL_COMMUNITY)
Admission: RE | Admit: 2021-11-12 | Discharge: 2021-11-12 | Disposition: A | Discharge status: Home / Self Care | Payer: Medicare Other | Source: Ambulatory Visit | Attending: Urology | Admitting: Urology

## 2021-11-12 ENCOUNTER — Inpatient Hospital Stay (HOSPITAL_COMMUNITY): Payer: Medicare Other | Admitting: Anesthesiology

## 2021-11-12 ENCOUNTER — Inpatient Hospital Stay (HOSPITAL_COMMUNITY): Payer: Medicare Other

## 2021-11-12 ENCOUNTER — Encounter (HOSPITAL_COMMUNITY)
Admission: RE | Disposition: A | Discharge status: Home / Self Care | Payer: Self-pay | Source: Home / Self Care | Attending: Urology

## 2021-11-12 ENCOUNTER — Encounter (HOSPITAL_COMMUNITY): Payer: Self-pay

## 2021-11-12 DIAGNOSIS — Z7982 Long term (current) use of aspirin: Secondary | ICD-10-CM

## 2021-11-12 DIAGNOSIS — F419 Anxiety disorder, unspecified: Secondary | ICD-10-CM | POA: Diagnosis present

## 2021-11-12 DIAGNOSIS — N200 Calculus of kidney: Principal | ICD-10-CM

## 2021-11-12 DIAGNOSIS — Z79899 Other long term (current) drug therapy: Secondary | ICD-10-CM

## 2021-11-12 DIAGNOSIS — I10 Essential (primary) hypertension: Secondary | ICD-10-CM | POA: Diagnosis present

## 2021-11-12 DIAGNOSIS — E669 Obesity, unspecified: Secondary | ICD-10-CM | POA: Diagnosis present

## 2021-11-12 DIAGNOSIS — E039 Hypothyroidism, unspecified: Secondary | ICD-10-CM | POA: Diagnosis present

## 2021-11-12 DIAGNOSIS — Z6836 Body mass index (BMI) 36.0-36.9, adult: Secondary | ICD-10-CM

## 2021-11-12 DIAGNOSIS — E7800 Pure hypercholesterolemia, unspecified: Secondary | ICD-10-CM | POA: Diagnosis present

## 2021-11-12 DIAGNOSIS — Z96653 Presence of artificial knee joint, bilateral: Secondary | ICD-10-CM | POA: Diagnosis present

## 2021-11-12 DIAGNOSIS — Z79890 Hormone replacement therapy (postmenopausal): Secondary | ICD-10-CM

## 2021-11-12 DIAGNOSIS — K5730 Diverticulosis of large intestine without perforation or abscess without bleeding: Secondary | ICD-10-CM | POA: Diagnosis not present

## 2021-11-12 DIAGNOSIS — Z8249 Family history of ischemic heart disease and other diseases of the circulatory system: Secondary | ICD-10-CM | POA: Diagnosis not present

## 2021-11-12 DIAGNOSIS — Z85820 Personal history of malignant melanoma of skin: Secondary | ICD-10-CM | POA: Diagnosis not present

## 2021-11-12 DIAGNOSIS — Z86711 Personal history of pulmonary embolism: Secondary | ICD-10-CM

## 2021-11-12 DIAGNOSIS — K760 Fatty (change of) liver, not elsewhere classified: Secondary | ICD-10-CM | POA: Diagnosis present

## 2021-11-12 DIAGNOSIS — Z89422 Acquired absence of other left toe(s): Secondary | ICD-10-CM | POA: Diagnosis not present

## 2021-11-12 DIAGNOSIS — N202 Calculus of kidney with calculus of ureter: Secondary | ICD-10-CM | POA: Diagnosis not present

## 2021-11-12 HISTORY — PX: IR URETERAL STENT PLACEMENT EXISTING ACCESS LEFT: IMG6073

## 2021-11-12 HISTORY — PX: NEPHROLITHOTOMY: SHX5134

## 2021-11-12 LAB — PROTIME-INR
INR: 1.1 (ref 0.8–1.2)
Prothrombin Time: 13.8 seconds (ref 11.4–15.2)

## 2021-11-12 LAB — CBC WITH DIFFERENTIAL/PLATELET
Abs Immature Granulocytes: 0.01 10*3/uL (ref 0.00–0.07)
Basophils Absolute: 0.1 10*3/uL (ref 0.0–0.1)
Basophils Relative: 1 %
Eosinophils Absolute: 0.2 10*3/uL (ref 0.0–0.5)
Eosinophils Relative: 4 %
HCT: 43.2 % (ref 36.0–46.0)
Hemoglobin: 13.8 g/dL (ref 12.0–15.0)
Immature Granulocytes: 0 %
Lymphocytes Relative: 27 %
Lymphs Abs: 1.5 10*3/uL (ref 0.7–4.0)
MCH: 29.6 pg (ref 26.0–34.0)
MCHC: 31.9 g/dL (ref 30.0–36.0)
MCV: 92.7 fL (ref 80.0–100.0)
Monocytes Absolute: 0.4 10*3/uL (ref 0.1–1.0)
Monocytes Relative: 7 %
Neutro Abs: 3.4 10*3/uL (ref 1.7–7.7)
Neutrophils Relative %: 61 %
Platelets: 186 10*3/uL (ref 150–400)
RBC: 4.66 MIL/uL (ref 3.87–5.11)
RDW: 14.9 % (ref 11.5–15.5)
WBC: 5.7 10*3/uL (ref 4.0–10.5)
nRBC: 0 % (ref 0.0–0.2)

## 2021-11-12 LAB — BASIC METABOLIC PANEL
Anion gap: 8 (ref 5–15)
Anion gap: 8 (ref 5–15)
BUN: 13 mg/dL (ref 8–23)
BUN: 12 mg/dL (ref 8–23)
CO2: 25 mmol/L (ref 22–32)
CO2: 26 mmol/L (ref 22–32)
Calcium: 9 mg/dL (ref 8.9–10.3)
Calcium: 8.7 mg/dL — ABNORMAL LOW (ref 8.9–10.3)
Chloride: 107 mmol/L (ref 98–111)
Chloride: 108 mmol/L (ref 98–111)
Creatinine, Ser: 1.01 mg/dL — ABNORMAL HIGH (ref 0.44–1.00)
Creatinine, Ser: 1.07 mg/dL — ABNORMAL HIGH (ref 0.44–1.00)
GFR, Estimated: 56 mL/min — ABNORMAL LOW (ref >60)
GFR, Estimated: 52 mL/min — ABNORMAL LOW (ref >60)
Glucose, Bld: 103 mg/dL — ABNORMAL HIGH (ref 70–99)
Glucose, Bld: 140 mg/dL — ABNORMAL HIGH (ref 70–99)
Potassium: 4 mmol/L (ref 3.5–5.1)
Potassium: 3.7 mmol/L (ref 3.5–5.1)
Sodium: 140 mmol/L (ref 135–145)
Sodium: 142 mmol/L (ref 135–145)

## 2021-11-12 LAB — HEMOGLOBIN AND HEMATOCRIT, BLOOD
HCT: 42.5 % (ref 36.0–46.0)
Hemoglobin: 13.5 g/dL (ref 12.0–15.0)

## 2021-11-12 SURGERY — NEPHROLITHOTOMY PERCUTANEOUS
Anesthesia: General | Laterality: Left

## 2021-11-12 MED ORDER — PHENYLEPHRINE 80 MCG/ML (10ML) SYRINGE FOR IV PUSH (FOR BLOOD PRESSURE SUPPORT)
PREFILLED_SYRINGE | INTRAVENOUS | Status: DC | PRN
  Administered 2021-11-12: 80 ug via INTRAVENOUS
  Administered 2021-11-12: 160 ug via INTRAVENOUS

## 2021-11-12 MED ORDER — LACTATED RINGERS IV SOLN
INTRAVENOUS | Status: DC
Start: 2021-11-12 — End: 2021-11-12

## 2021-11-12 MED ORDER — PROPOFOL 10 MG/ML IV BOLUS
INTRAVENOUS | Status: AC
  Filled 2021-11-12: qty 20

## 2021-11-12 MED ORDER — DIPHENHYDRAMINE HCL 12.5 MG/5ML PO ELIX: 12.5000 mg | ORAL_SOLUTION | Freq: Four times a day (QID) | ORAL | Status: DC | PRN

## 2021-11-12 MED ORDER — PROPOFOL 10 MG/ML IV BOLUS
INTRAVENOUS | Status: DC | PRN
  Administered 2021-11-12: 120 mg via INTRAVENOUS

## 2021-11-12 MED ORDER — CEFAZOLIN SODIUM-DEXTROSE 2-4 GM/100ML-% IV SOLN
2.0000 g | Freq: Three times a day (TID) | INTRAVENOUS | Status: AC
  Administered 2021-11-12 – 2021-11-13 (×2): 2 g via INTRAVENOUS
  Filled 2021-11-12 (×2): qty 100

## 2021-11-12 MED ORDER — FUROSEMIDE 40 MG PO TABS
40.0000 mg | ORAL_TABLET | Freq: Every day | ORAL | Status: DC
  Administered 2021-11-13 – 2021-11-14 (×2): 40 mg via ORAL
  Filled 2021-11-12 (×2): qty 1

## 2021-11-12 MED ORDER — SODIUM CHLORIDE 0.9 % IV SOLN: 250.0000 mL | INTRAVENOUS | Status: DC | PRN

## 2021-11-12 MED ORDER — SODIUM CHLORIDE 0.9 % IR SOLN
Status: DC | PRN
  Administered 2021-11-12: 27000 mL

## 2021-11-12 MED ORDER — IOHEXOL 300 MG/ML  SOLN
50.0000 mL | Freq: Once | INTRAMUSCULAR | Status: AC | PRN
  Administered 2021-11-12: 5 mL

## 2021-11-12 MED ORDER — FENTANYL CITRATE (PF) 100 MCG/2ML IJ SOLN
INTRAMUSCULAR | Status: DC | PRN
  Administered 2021-11-12 (×2): 50 ug via INTRAVENOUS

## 2021-11-12 MED ORDER — ONDANSETRON HCL 4 MG/2ML IJ SOLN
INTRAMUSCULAR | Status: DC | PRN
  Administered 2021-11-12: 4 mg via INTRAVENOUS

## 2021-11-12 MED ORDER — MIDAZOLAM HCL 2 MG/2ML IJ SOLN
INTRAMUSCULAR | Status: AC | PRN
  Administered 2021-11-12 (×2): 1 mg via INTRAVENOUS

## 2021-11-12 MED ORDER — TRAMADOL HCL 50 MG PO TABS
50.0000 mg | ORAL_TABLET | Freq: Four times a day (QID) | ORAL | Status: DC | PRN
  Administered 2021-11-13 – 2021-11-14 (×3): 50 mg via ORAL
  Filled 2021-11-12 (×3): qty 1

## 2021-11-12 MED ORDER — SUGAMMADEX SODIUM 500 MG/5ML IV SOLN
INTRAVENOUS | Status: DC | PRN
  Administered 2021-11-12: 300 mg via INTRAVENOUS

## 2021-11-12 MED ORDER — CHLORHEXIDINE GLUCONATE 0.12 % MT SOLN
15.0000 mL | Freq: Once | OROMUCOSAL | Status: AC
Start: 2021-11-12 — End: 2021-11-12

## 2021-11-12 MED ORDER — IOHEXOL 300 MG/ML  SOLN
INTRAMUSCULAR | Status: DC | PRN
  Administered 2021-11-12: 20 mL

## 2021-11-12 MED ORDER — FENTANYL CITRATE PF 50 MCG/ML IJ SOSY
PREFILLED_SYRINGE | INTRAMUSCULAR | Status: AC
  Administered 2021-11-12: 50 ug via INTRAVENOUS
  Filled 2021-11-12: qty 3

## 2021-11-12 MED ORDER — SODIUM CHLORIDE 0.9 % IV SOLN
2.0000 g | Freq: Once | INTRAVENOUS | Status: AC
  Administered 2021-11-12: 2 g via INTRAVENOUS
  Filled 2021-11-12: qty 20

## 2021-11-12 MED ORDER — MORPHINE SULFATE (PF) 2 MG/ML IV SOLN: 2.0000 mg | INTRAVENOUS | Status: DC | PRN

## 2021-11-12 MED ORDER — LIDOCAINE HCL (CARDIAC) PF 100 MG/5ML IV SOSY
PREFILLED_SYRINGE | INTRAVENOUS | Status: DC | PRN
  Administered 2021-11-12: 60 mg via INTRAVENOUS

## 2021-11-12 MED ORDER — SUCCINYLCHOLINE CHLORIDE 200 MG/10ML IV SOSY
PREFILLED_SYRINGE | INTRAVENOUS | Status: AC
Start: 2021-11-12 — End: ?
  Filled 2021-11-12: qty 10

## 2021-11-12 MED ORDER — DIPHENHYDRAMINE HCL 50 MG/ML IJ SOLN: 12.5000 mg | Freq: Four times a day (QID) | INTRAMUSCULAR | Status: DC | PRN

## 2021-11-12 MED ORDER — FENTANYL CITRATE PF 50 MCG/ML IJ SOSY
25.0000 ug | PREFILLED_SYRINGE | INTRAMUSCULAR | Status: DC | PRN
  Administered 2021-11-12 (×2): 50 ug via INTRAVENOUS

## 2021-11-12 MED ORDER — 0.9 % SODIUM CHLORIDE (POUR BTL) OPTIME
TOPICAL | Status: DC | PRN
  Administered 2021-11-12: 1000 mL

## 2021-11-12 MED ORDER — LIDOCAINE HCL 1 % IJ SOLN
INTRAMUSCULAR | Status: AC
  Filled 2021-11-12: qty 20

## 2021-11-12 MED ORDER — ONDANSETRON HCL 4 MG/2ML IJ SOLN: 4.0000 mg | INTRAMUSCULAR | Status: DC | PRN

## 2021-11-12 MED ORDER — SENNOSIDES-DOCUSATE SODIUM 8.6-50 MG PO TABS
2.0000 | ORAL_TABLET | Freq: Every day | ORAL | Status: DC
  Filled 2021-11-12: qty 2

## 2021-11-12 MED ORDER — DEXTROSE-NACL 5-0.45 % IV SOLN: INTRAVENOUS | Status: DC

## 2021-11-12 MED ORDER — LEVOTHYROXINE SODIUM 200 MCG PO TABS: 200.0000 ug | ORAL_TABLET | ORAL | Status: DC

## 2021-11-12 MED ORDER — ROCURONIUM BROMIDE 10 MG/ML (PF) SYRINGE
PREFILLED_SYRINGE | INTRAVENOUS | Status: AC
Start: 2021-11-12 — End: ?
  Filled 2021-11-12: qty 10

## 2021-11-12 MED ORDER — LIDOCAINE HCL (PF) 2 % IJ SOLN
INTRAMUSCULAR | Status: AC
Start: 2021-11-12 — End: ?
  Filled 2021-11-12: qty 5

## 2021-11-12 MED ORDER — ACETAMINOPHEN 500 MG PO TABS
1000.0000 mg | ORAL_TABLET | Freq: Four times a day (QID) | ORAL | Status: AC
  Administered 2021-11-12 – 2021-11-13 (×4): 1000 mg via ORAL
  Filled 2021-11-12 (×4): qty 2

## 2021-11-12 MED ORDER — FENTANYL CITRATE (PF) 100 MCG/2ML IJ SOLN
INTRAMUSCULAR | Status: AC
  Filled 2021-11-12: qty 4

## 2021-11-12 MED ORDER — SODIUM CHLORIDE 0.9% FLUSH
3.0000 mL | Freq: Two times a day (BID) | INTRAVENOUS | Status: DC
Start: 2021-11-12 — End: 2021-11-14
  Administered 2021-11-13 – 2021-11-14 (×3): 3 mL via INTRAVENOUS

## 2021-11-12 MED ORDER — SODIUM CHLORIDE 0.9% FLUSH: 3.0000 mL | INTRAVENOUS | Status: DC | PRN

## 2021-11-12 MED ORDER — ACETAMINOPHEN 500 MG PO TABS
1000.0000 mg | ORAL_TABLET | Freq: Once | ORAL | Status: AC
  Administered 2021-11-12: 1000 mg via ORAL
  Filled 2021-11-12: qty 2

## 2021-11-12 MED ORDER — ONDANSETRON HCL 4 MG/2ML IJ SOLN
INTRAMUSCULAR | Status: AC
  Filled 2021-11-12: qty 2

## 2021-11-12 MED ORDER — EPHEDRINE SULFATE-NACL 50-0.9 MG/10ML-% IV SOSY
PREFILLED_SYRINGE | INTRAVENOUS | Status: DC | PRN
  Administered 2021-11-12: 10 mg via INTRAVENOUS
  Administered 2021-11-12: 5 mg via INTRAVENOUS

## 2021-11-12 MED ORDER — CEFAZOLIN SODIUM-DEXTROSE 2-4 GM/100ML-% IV SOLN
2.0000 g | INTRAVENOUS | Status: DC
  Filled 2021-11-12: qty 100

## 2021-11-12 MED ORDER — LACTATED RINGERS IV SOLN: INTRAVENOUS | Status: DC

## 2021-11-12 MED ORDER — AMLODIPINE BESYLATE 5 MG PO TABS
5.0000 mg | ORAL_TABLET | Freq: Every day | ORAL | Status: DC
  Administered 2021-11-13 – 2021-11-14 (×2): 5 mg via ORAL
  Filled 2021-11-12 (×2): qty 1

## 2021-11-12 MED ORDER — DEXAMETHASONE SODIUM PHOSPHATE 10 MG/ML IJ SOLN
INTRAMUSCULAR | Status: DC | PRN
  Administered 2021-11-12: 10 mg via INTRAVENOUS

## 2021-11-12 MED ORDER — ORAL CARE MOUTH RINSE
15.0000 mL | Freq: Once | OROMUCOSAL | Status: AC
  Administered 2021-11-12: 15 mL via OROMUCOSAL

## 2021-11-12 MED ORDER — FENTANYL CITRATE (PF) 100 MCG/2ML IJ SOLN
INTRAMUSCULAR | Status: AC
  Filled 2021-11-12: qty 2

## 2021-11-12 MED ORDER — LEVOTHYROXINE SODIUM 100 MCG PO TABS
200.0000 ug | ORAL_TABLET | ORAL | Status: DC
  Administered 2021-11-13 – 2021-11-14 (×2): 200 ug via ORAL
  Filled 2021-11-12 (×2): qty 2

## 2021-11-12 MED ORDER — ALPRAZOLAM 0.25 MG PO TABS
0.2500 mg | ORAL_TABLET | Freq: Every evening | ORAL | Status: DC | PRN
  Administered 2021-11-12 – 2021-11-14 (×2): 0.25 mg via ORAL
  Filled 2021-11-12 (×2): qty 1

## 2021-11-12 MED ORDER — ROCURONIUM BROMIDE 10 MG/ML (PF) SYRINGE
PREFILLED_SYRINGE | INTRAVENOUS | Status: DC | PRN
  Administered 2021-11-12: 20 mg via INTRAVENOUS
  Administered 2021-11-12: 60 mg via INTRAVENOUS

## 2021-11-12 MED ORDER — MIDAZOLAM HCL 2 MG/2ML IJ SOLN
INTRAMUSCULAR | Status: AC
  Filled 2021-11-12: qty 4

## 2021-11-12 MED ORDER — ONDANSETRON HCL 4 MG/2ML IJ SOLN: 4.0000 mg | Freq: Once | INTRAMUSCULAR | Status: DC | PRN

## 2021-11-12 MED ORDER — FENTANYL CITRATE (PF) 100 MCG/2ML IJ SOLN
INTRAMUSCULAR | Status: AC | PRN
  Administered 2021-11-12 (×2): 25 ug via INTRAVENOUS
  Administered 2021-11-12: 50 ug via INTRAVENOUS

## 2021-11-12 MED ORDER — LEVOTHYROXINE SODIUM 100 MCG PO TABS: 300.0000 ug | ORAL_TABLET | ORAL | Status: DC

## 2021-11-12 SURGICAL SUPPLY — 55 items
ADAPTER GOLDBERG URETERAL (ADAPTER) IMPLANT
BAG COUNTER SPONGE SURGICOUNT (BAG) IMPLANT
BAG URINE DRAIN 2000ML AR STRL (UROLOGICAL SUPPLIES) IMPLANT
BASKET LASER NITINOL 1.9FR (BASKET) IMPLANT
BASKET STONE NITINOL 3FRX115MB (UROLOGICAL SUPPLIES) ×1 IMPLANT
BENZOIN TINCTURE PRP APPL 2/3 (GAUZE/BANDAGES/DRESSINGS) ×2 IMPLANT
BLADE SURG 15 STRL LF DISP TIS (BLADE) ×1 IMPLANT
BLADE SURG 15 STRL SS (BLADE) ×2
BOOTIES KNEE HIGH SLOAN (MISCELLANEOUS) ×2 IMPLANT
CATH FOLEY 2W COUNCIL 20FR 5CC (CATHETERS) ×1 IMPLANT
CATH FOLEY 2WAY SLVR  5CC 18FR (CATHETERS)
CATH FOLEY 2WAY SLVR 5CC 18FR (CATHETERS) IMPLANT
CATH ROBINSON RED A/P 20FR (CATHETERS) IMPLANT
CATH URETERAL DUAL LUMEN 10F (MISCELLANEOUS) ×2 IMPLANT
CATH URETL OPEN 5X70 (CATHETERS) IMPLANT
CATH UROLOGY TORQUE 40 (MISCELLANEOUS) IMPLANT
CATH X-FORCE N30 NEPHROSTOMY (TUBING) ×2 IMPLANT
CHLORAPREP W/TINT 26 (MISCELLANEOUS) ×2 IMPLANT
COVER SURGICAL LIGHT HANDLE (MISCELLANEOUS) IMPLANT
DRAPE C-ARM 42X120 X-RAY (DRAPES) ×2 IMPLANT
DRAPE LINGEMAN PERC (DRAPES) ×2 IMPLANT
DRAPE SURG IRRIG POUCH 19X23 (DRAPES) ×2 IMPLANT
DRSG PAD ABDOMINAL 8X10 ST (GAUZE/BANDAGES/DRESSINGS) IMPLANT
DRSG TEGADERM 8X12 (GAUZE/BANDAGES/DRESSINGS) ×1 IMPLANT
GAUZE PAD ABD 8X10 STRL (GAUZE/BANDAGES/DRESSINGS) ×1 IMPLANT
GAUZE SPONGE 4X4 12PLY STRL (GAUZE/BANDAGES/DRESSINGS) ×1 IMPLANT
GLOVE BIO SURGEON STRL SZ 6.5 (GLOVE) ×2 IMPLANT
GOWN STRL REUS W/ TWL LRG LVL3 (GOWN DISPOSABLE) ×1 IMPLANT
GOWN STRL REUS W/TWL LRG LVL3 (GOWN DISPOSABLE) ×2
GUIDEWIRE AMPLATZ STIFF 0.35 (WIRE) ×1 IMPLANT
GUIDEWIRE AMPLAZ .035X145 (WIRE) ×2 IMPLANT
GUIDEWIRE STR DUAL SENSOR (WIRE) ×3 IMPLANT
KIT BASIN OR (CUSTOM PROCEDURE TRAY) ×2 IMPLANT
KIT PROBE 340X3.4XDISP GRN (MISCELLANEOUS) IMPLANT
KIT PROBE TRILOGY 3.4X340 (MISCELLANEOUS)
KIT PROBE TRILOGY 3.9X350 (MISCELLANEOUS) ×1 IMPLANT
KIT TURNOVER KIT A (KITS) IMPLANT
LASER FIB FLEXIVA PULSE ID 365 (Laser) ×1 IMPLANT
LASER FIB FLEXIVA PULSE ID 550 (Laser) IMPLANT
MANIFOLD NEPTUNE II (INSTRUMENTS) ×2 IMPLANT
MAT GRAY ABSORB FLUID 28X50 (MISCELLANEOUS) ×1 IMPLANT
NS IRRIG 1000ML POUR BTL (IV SOLUTION) ×2 IMPLANT
PACK CYSTO (CUSTOM PROCEDURE TRAY) ×2 IMPLANT
SPONGE T-LAP 4X18 ~~LOC~~+RFID (SPONGE) ×2 IMPLANT
STENT URET 6FRX28 CONTOUR (STENTS) ×1 IMPLANT
SUT SILK 2 0 30  PSL (SUTURE) ×2
SUT SILK 2 0 30 PSL (SUTURE) IMPLANT
SYR 20ML LL LF (SYRINGE) ×2 IMPLANT
TOWEL OR 17X26 10 PK STRL BLUE (TOWEL DISPOSABLE) ×2 IMPLANT
TRACTIP FLEXIVA PULS ID 200XHI (Laser) IMPLANT
TRACTIP FLEXIVA PULSE ID 200 (Laser)
TRAY FOLEY MTR SLVR 16FR STAT (SET/KITS/TRAYS/PACK) ×2 IMPLANT
TUBING CONNECTING 10 (TUBING) ×4 IMPLANT
TUBING STONE CATCHER TRILOGY (MISCELLANEOUS) ×1 IMPLANT
TUBING UROLOGY SET (TUBING) ×2 IMPLANT

## 2021-11-12 NOTE — Discharge Instructions (Signed)

## 2021-11-12 NOTE — Discharge Instructions (Signed)
Moderate Conscious Sedation, Adult, Care After This sheet gives you information about how to care for yourself after your procedure. Your health care provider may also give you more specific instructions. If you have problems or questions, contact your health careprovider. What can I expect after the procedure? After the procedure, it is common to have: Sleepiness for several hours. Impaired judgment for several hours. Difficulty with balance. Vomiting if you eat too soon. Follow these instructions at home: For the time period you were told by your health care provider: Rest. Do not participate in activities where you could fall or become injured. Do not drive or use machinery. Do not drink alcohol. Do not take sleeping pills or medicines that cause drowsiness. Do not make important decisions or sign legal documents. Do not take care of children on your own. Eating and drinking Follow the diet recommended by your health care provider. Drink enough fluid to keep your urine pale yellow. If you vomit: Drink water, juice, or soup when you can drink without vomiting. Make sure you have little or no nausea before eating solid foods.  General instructions Take over-the-counter and prescription medicines only as told by your health care provider. Have a responsible adult stay with you for the time you are told. It is important to have someone help care for you until you are awake and alert. Do not smoke. Keep all follow-up visits as told by your health care provider. This is important. Contact a health care provider if: You are still sleepy or having trouble with balance after 24 hours. You feel light-headed. You keep feeling nauseous or you keep vomiting. You develop a rash. You have a fever. You have redness or swelling around the IV site. Get help right away if: You have trouble breathing. You have new-onset confusion at home. Summary After the procedure, it is common to feel  sleepy, have impaired judgment, or feel nauseous if you eat too soon. Rest after you get home. Know the things you should not do after the procedure. Follow the diet recommended by your health care provider and drink enough fluid to keep your urine pale yellow. Get help right away if you have trouble breathing or new-onset confusion at home. This information is not intended to replace advice given to you by your health care provider. Make sure you discuss any questions you have with your healthcare provider.  Percutaneous Nephrostomy, Care After This sheet gives you information about how to care for yourself after your procedure. Your health care provider may also give you more specific instructions. If you have problems or questions, contact your health care provider. What can I expect after the procedure? After the procedure, it is common to have: Some soreness where the nephrostomy tube was inserted (tube insertion site). Blood-tinged drainage from the nephrostomy tube for the first 24 hours. Follow these instructions at home: Activity  Do not lift anything that is heavier than 10 lb (4.5 kg), or the limit that you are told, until your health care provider says that it is safe. Return to your normal activities as told by your health care provider. Ask your health care provider what activities are safe for you. Avoid activities that may cause the nephrostomy tubing to bend. Do not take baths, swim, or use a hot tub until your health care provider approves. Ask your health care provider if you can take showers. Cover the nephrostomy tube bandage (dressing) with a watertight covering when you take a shower. If you  were given a sedative during the procedure, it can affect you for several hours. Do not drive or operate machinery until your health care provider says that it is safe. Care of the tube insertion site  Follow instructions from your health care provider about how to take care of your  tube insertion site. Make sure you: Wash your hands with soap and water for at least 20 seconds before you change your dressing. If soap and water are not available, use hand sanitizer. Change your dressing as told by your health care provider. Be careful not to pull on the tube while removing the dressing. When you change the dressing, wash the skin around the tube, rinse well, and pat the skin dry. Check the tube insertion area every day for signs of infection. Check for: Redness, swelling, or pain. Fluid or blood. Warmth. Pus or a bad smell. Care of the nephrostomy tube and drainage bag Always keep the tubing, the leg bag, or the bedside drainage bags below the level of the kidney so that your urine drains freely. When connecting your nephrostomy tube to a drainage bag, make sure that there are no kinks in the tubing and that your urine is draining freely. You may want to use an elastic bandage to wrap any exposed tubing that goes from the nephrostomy tube to any of the connecting tubes. At night, you may want to connect your nephrostomy tube or the leg bag to a larger bedside drainage bag. Follow instructions from your health care provider about how to empty or change the drainage bag. Empty the drainage bag when it becomes ? full. Replace the drainage bag and any extension tubing that is connected to your nephrostomy tube every 7 days or as told by your health care provider. Your health care provider will explain how to change the drainage bag and extension tubing. General instructions Take over-the-counter and prescription medicines only as told by your health care provider. Keep all follow-up visits as told by your health care provider. This is important. The nephrostomy tube will need to be changed every 8-12 weeks. Contact a health care provider if: You have problems with any of the valves or tubing. You have persistent pain or soreness in your back. You have redness, swelling, or  pain around your tube insertion site. You have fluid or blood coming from your tube insertion site. Your tube insertion site feels warm to the touch. You have pus or a bad smell coming from your tube insertion site. You have increased urine output or you feel burning when urinating. Get help right away if: You have pain in your abdomen during the first week. You have chest pain or have trouble breathing. You have a new appearance of blood in your urine. You have a fever or chills. You have back pain that is not relieved by your medicine. You have decreased urine output. Your nephrostomy tube comes out. Summary After the procedure, it is common to have some soreness where the nephrostomy tube was inserted (tube insertion site). Follow instructions from your health care provider about how to take care of your tube insertion site, nephrostomy tube, and drainage bag. Keep all follow-up visits for care and for changing the tube. This information is not intended to replace advice given to you by your health care provider. Make sure you discuss any questions you have with your health care provider.

## 2021-11-12 NOTE — H&P (Signed)
CC/HPI: cc: Staghorn calculus   09/06/2021: 80 year old woman referred for management of staghorn calculus. She been treated for ongoing UTI for approximately 2 months. Imaging showed large staghorn calculus. Patient denies any significant pain from the stone but does have pain after long periods of standing. She has undergone surgery in the past and tolerated anesthesia well. She is interested in having the stone removed. She has a lot of family in [place] for support.   10/28/2021: Patient here today for preoperative appointment prior to undergoing left PCNL to treat previously identified staghorn calculus in the left kidney on 7/25 with Dr. Claudia Desanctis. Patient remains on trimethoprim for bacterial suppression due to to recurrent UTI. She ran out of the medication about 2 days ago. She is not having any recurrence of UTI symptoms including absence of dysuria, gross hematuria. She does continue to have some midline and left-sided lower back pain/discomfort but this usually occurs with activity and position changes but resolves at rest. She denies any interval stone material passage. Patient denies any changes in past medical history, prescription medications taken on daily basis, no interval surgical or procedural intervention. She denies any recent fevers or chills, nausea/vomiting. She has had no recent chest pain, shortness of breath, lightheadedness or dizziness, numbness or tingling in the extremities.     ALLERGIES: None   MEDICATIONS: Aspirin  Levothyroxine Sodium 200 mcg tablet  Amlodipine Besylate  Furosemide  Trimethoprim 100 mg tablet 1 tablet PO Q HS     GU PSH: Cystoscopy - 07/24/2021 Locm 300-'399Mg'$ /Ml Iodine,1Ml - 08/12/2021     NON-GU PSH: Knee replacement, Bilateral Removal of skin cancer Thyroid Surgery     GU PMH: Renal calculus - 09/06/2021, - 08/19/2021 Urinary Tract Inf, Unspec site - 08/19/2021, - 07/24/2021 Microscopic hematuria - 08/12/2021, - 07/24/2021 Nocturia -  07/24/2021 Urge incontinence - 07/24/2021 Urinary Frequency - 07/24/2021    NON-GU PMH: Arthritis Cardiac murmur, unspecified DVT, History Hypertension Hypothyroidism Skin Cancer, History    FAMILY HISTORY: 1 Daughter - Daughter 1 son - Son Heart trouble - Mother Myocardial Infarction - Father   SOCIAL HISTORY: Marital Status: Divorced Preferred Language: English; Ethnicity: Not Hispanic Or Latino; Race: White Current Smoking Status: Patient has never smoked.   Tobacco Use Assessment Completed: Used Tobacco in last 30 days? Does not use smokeless tobacco. Has never drank.  Drinks 3 caffeinated drinks per day. 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    REVIEW OF SYSTEMS:    GU Review Female:   Patient reports get up at night to urinate. Patient denies frequent urination, hard to postpone urination, burning /pain with urination, leakage of urine, stream starts and stops, trouble starting your stream, have to strain to urinate, and being pregnant.  Gastrointestinal (Upper):   Patient denies nausea, vomiting, and indigestion/ heartburn.  Gastrointestinal (Lower):   Patient denies diarrhea and constipation.  Constitutional:   Patient denies fever, night sweats, weight loss, and fatigue.  Skin:   Patient denies skin rash/ lesion and itching.  Eyes:   Patient denies blurred vision and double vision.  Ears/ Nose/ Throat:   Patient denies sore throat and sinus problems.  Hematologic/Lymphatic:   Patient denies swollen glands and easy bruising.  Cardiovascular:   Patient denies leg swelling and chest pains.  Respiratory:   Patient denies shortness of breath and cough.  Endocrine:   Patient denies excessive thirst.  Musculoskeletal:   Patient reports back pain and joint pain.   Neurological:   Patient denies headaches and dizziness.  Psychologic:   Patient denies depression and anxiety.   VITAL SIGNS:      10/28/2021 11:14 AM  Weight 220 lb / 99.79 kg  Height 65 in /  165.1 cm  BP 131/81 mmHg  Heart Rate 104 /min  Temperature 98.2 F / 36.7 C  BMI 36.6 kg/m   MULTI-SYSTEM PHYSICAL EXAMINATION:    Constitutional: Well-nourished. No physical deformities. Normally developed. Good grooming.  Neck: Neck symmetrical, not swollen. Normal tracheal position.  Respiratory: No labored breathing, no use of accessory muscles.   Cardiovascular: Normal temperature, normal extremity pulses, no swelling, no varicosities.  Skin: No paleness, no jaundice, no cyanosis. No lesion, no ulcer, no rash.  Neurologic / Psychiatric: Oriented to time, oriented to place, oriented to person. No depression, no anxiety, no agitation.  Gastrointestinal: Obese abdomen. No mass, no tenderness, no rigidity.   Musculoskeletal: Normal gait and station of head and neck.     Complexity of Data:  Source Of History:  Patient, Medical Record Summary  Records Review:   Previous Doctor Records, Previous Hospital Records, Previous Patient Records  Urine Test Review:   Urinalysis, Urine Culture  X-Ray Review: C.T. Abdomen/Pelvis: Reviewed Films. Reviewed Report.     10/28/21  Urinalysis  Urine Appearance Slightly Cloudy   Urine Color Yellow   Urine Glucose Neg mg/dL  Urine Bilirubin Neg mg/dL  Urine Ketones Neg mg/dL  Urine Specific Gravity 1.015   Urine Blood Neg ery/uL  Urine pH <=5.0   Urine Protein Neg mg/dL  Urine Urobilinogen 0.2 mg/dL  Urine Nitrites Neg   Urine Leukocyte Esterase 3+ leu/uL  Urine WBC/hpf 20 - 40/hpf   Urine RBC/hpf 0 - 2/hpf   Urine Epithelial Cells 6 - 10/hpf   Urine Bacteria Mod (26-50/hpf)   Urine Mucous Not Present   Urine Yeast NS (Not Seen)   Urine Trichomonas Not Present   Urine Cystals NS (Not Seen)   Urine Casts NS (Not Seen)   Urine Sperm Not Present    PROCEDURES:          Urinalysis w/Scope Dipstick Dipstick Cont'd Micro  Color: Yellow Bilirubin: Neg mg/dL WBC/hpf: 20 - 40/hpf  Appearance: Slightly Cloudy Ketones: Neg mg/dL RBC/hpf: 0 -  2/hpf  Specific Gravity: 1.015 Blood: Neg ery/uL Bacteria: Mod (26-50/hpf)  pH: <=5.0 Protein: Neg mg/dL Cystals: NS (Not Seen)  Glucose: Neg mg/dL Urobilinogen: 0.2 mg/dL Casts: NS (Not Seen)    Nitrites: Neg Trichomonas: Not Present    Leukocyte Esterase: 3+ leu/uL Mucous: Not Present      Epithelial Cells: 6 - 10/hpf      Yeast: NS (Not Seen)      Sperm: Not Present    ASSESSMENT:      ICD-10 Details  1 GU:   Renal calculus - N20.0 Left, Chronic, Threat to Bodily Function   PLAN:            Medications Refill Meds: Trimethoprim 100 mg tablet 1 tablet PO Q HS   #90  1 Refill(s)            Orders Labs Urine Culture          Schedule Return Visit/Planned Activity: Keep Scheduled Appointment - Follow up MD, Schedule Surgery          Document Letter(s):  Created for Patient: Clinical Summary         Notes:   All questions answered to the best of my ability regarding the upcoming procedure and expected postoperative course with understanding  expressed by the patient. I will refill trimethoprim so that she remains current on suppressive antimicrobial therapy. Urine culture also sent today to serve as a baseline. If definitive bacterial growth identified, given the absence of lower urinary tract symptoms today I will likely hold off on additional antibiotics until the week prior to her procedure. Moving forward she will keep her scheduled surgery date for left PCNL on 7/25 with Dr. Claudia Desanctis.

## 2021-11-12 NOTE — Anesthesia Postprocedure Evaluation (Signed)
Anesthesia Post Note  Patient: Cassidy [name]  Procedure(s) Performed: NEPHROLITHOTOMY PERCUTANEOUS (Left)     Patient location during evaluation: PACU Anesthesia Type: General Level of consciousness: awake and alert Pain management: pain level controlled Vital Signs Assessment: post-procedure vital signs reviewed and stable Respiratory status: spontaneous breathing, nonlabored ventilation and respiratory function stable Cardiovascular status: stable and blood pressure returned to baseline Anesthetic complications: no   No notable events documented.  Last Vitals:  Vitals:   11/12/21 1400 11/12/21 1415  BP: 127/70 133/72  Pulse: 69 71  Resp: 16 17  Temp:    SpO2: 98% 98%    Last Pain:  Vitals:   11/12/21 1420  PainSc: (P) [place]

## 2021-11-12 NOTE — Interval H&P Note (Signed)
History and Physical Interval Note:  11/12/2021 11:04 AM  [place]  has presented today for surgery, with the diagnosis of LEFT STAGHORN CALCULUS.  The various methods of treatment have been discussed with the patient and family. After consideration of risks, benefits and other options for treatment, the patient has consented to  Procedure(s) with comments: NEPHROLITHOTOMY PERCUTANEOUS (Left) - 3 HRS as a surgical intervention.  The patient's history has been reviewed, patient examined, no change in status, stable for surgery.  I have reviewed the patient's chart and labs.  Questions were answered to the patient's satisfaction.     Brycelyn Gambino D Koty Anctil

## 2021-11-12 NOTE — Op Note (Addendum)
Operative Note  Preoperative diagnosis:  1. Left staghorn calculus  Postoperative diagnosis: 1. Left staghorn calculus   Procedure(s): 1. LEFT Percutaneous nephrolithotomy  Surgeon: Jacalyn Lefevre, MD  Assistants:none  Anesthesia: General  Complications: None immediate  EBL: 100 cc  Specimens: 1.  Renal calculus  Drains/Catheters: 1. 6Fr x 28 cmm left JJ stent no tether 2. 20Fr council tip foley was nephrostomy tube 3. 16Fr urethral foley   Intraoperative findings:  Left staghorn calculus  Nephrostogram at end of case without evidence of extravasation   Indication: 81 year old woman with a large staghorn (>2 cm) renal calculus presents for the previously mentioned operation.  Description of procedure:  The patient was identified and consent was obtained.  The patient was taken to the operating room and placed in the supine position.  The patient was placed under general anesthesia.  Perioperative antibiotics were administered.  The patient was placed in prone position and all pressure points were padded.  Patient was prepped and draped in a standard sterile fashion and a timeout was performed.  A Super Stiff wire was advanced through the nephroureteral stent down to the bladder under fluoroscopic guidance and the nephroureteral stent was removed.  A dual-lumen ureteral catheter was advanced over the Super Stiff wire into the renal pelvis and an antegrade nephrostogram was performed.  This showed a well opacified kidney and a filling defect corresponding to the stone of interest.  I advanced the dual-lumen ureteral catheter into the proximal ureter under fluoroscopic guidance followed by placement of a second Super Stiff wire down to the bladder under fluoroscopic guidance.  The dual-lumen catheter was removed.  An incision was made alongside the wires.  The balloon dilator was then advanced over one of the wires and into the renal pelvis fluoroscopic guidance and the tract  was dilated to a pressure of 18.  The sheath was advanced over the balloon and into the renal pelvis.  The balloon was withdrawn keeping the sheath in place.  The nephroscope was advanced into the kidney and the stone of interest was encountered.  The stone was then removed with a combination of pneumatic and ultrasound with suction.  Attempts at navigating the nephroscope to the upper pole unsuccessful.  A flexible cystoscope was then used to examine the rest of the kidney.  Part of the staghorn was seen in the upper pole.  A 365 m laser fiber was then used to fragment the stone and a basket was used to remove the pieces until no further fragments in the upper pole were seen.  All stone was removed and there was no evidence of any other stones within the kidney.  The nephroscope along with the sheath were withdrawn.    A 6 x 28cm double-J ureteral stent was advanced over 1 of the wires under fluoroscopic guidance and the wire was withdrawn.  Fluoroscopy confirmed a good coil within the bladder as well as a good coil in the renal pelvis proximally.  A 20 [place] council tip catheter was advanced over the wire and 2 cc of contrast was instilled into the catheter balloon.  Nephrostogram was performed witho no extravasation of contrast noted.  The council tip catheter was secured down with a silk suture.  This concluded the operation.  The patient tolerated procedure well and was stable postoperatively.  Plan: Patient will remain under observation overnight. Stent to be removed in 2 weeks.

## 2021-11-12 NOTE — Transfer of Care (Signed)
Immediate Anesthesia Transfer of Care Note  Patient: Cassidy [name]  Procedure(s) Performed: NEPHROLITHOTOMY PERCUTANEOUS (Left)  Patient Location: PACU  Anesthesia Type:General  Level of Consciousness: sedated  Airway & Oxygen Therapy: Patient Spontanous Breathing and Patient connected to face mask oxygen  Post-op Assessment: Report given to RN and Post -op Vital signs reviewed and stable  Post vital signs: Reviewed and stable  Last Vitals:  Vitals Value Taken Time  BP    Temp    Pulse 75 11/12/21 1329  Resp    SpO2 100 % 11/12/21 1329  Vitals shown include unvalidated device data.  Last Pain:  Vitals:   11/12/21 1100  PainSc: 0-No pain         Complications: No notable events documented.

## 2021-11-12 NOTE — OR Nursing (Signed)
Stone taken by Dr. Claudia Desanctis

## 2021-11-12 NOTE — Anesthesia Procedure Notes (Addendum)
Procedure Name: Intubation Date/Time: 11/12/2021 11:23 AM  Performed by: Lind Covert, CRNAPre-anesthesia Checklist: Patient identified, Emergency Drugs available, Suction available, Patient being monitored and Timeout performed Patient Re-evaluated:Patient Re-evaluated prior to induction Oxygen Delivery Method: Circle system utilized Preoxygenation: Pre-oxygenation with 100% oxygen Induction Type: IV induction Ventilation: Mask ventilation without difficulty and Oral airway inserted - appropriate to patient size Laryngoscope Size: Mac, 3 and Glidescope (Previous difficult intubation, Glidescope chosen) Tube type: Oral Tube size: 7.0 mm Number of attempts: 1 Airway Equipment and Method: Stylet and Video-laryngoscopy Placement Confirmation: ETT inserted through vocal cords under direct vision, positive ETCO2 and breath sounds checked- equal and bilateral Secured at: 22 cm Tube secured with: Tape Dental Injury: Teeth and Oropharynx as per pre-operative assessment

## 2021-11-12 NOTE — Procedures (Signed)
Interventional Radiology Procedure Note  Procedure: Left nephroureteral access placement  Indication: Pre PCNL  Findings: Please refer to procedural dictation for full description.  Complications: None  EBL: < 10 mL  Miachel Roux, MD [phone]

## 2021-11-13 ENCOUNTER — Inpatient Hospital Stay (HOSPITAL_COMMUNITY): Payer: Medicare Other

## 2021-11-13 ENCOUNTER — Encounter (HOSPITAL_COMMUNITY): Payer: Self-pay | Admitting: Urology

## 2021-11-13 ENCOUNTER — Other Ambulatory Visit (HOSPITAL_COMMUNITY): Payer: Medicare Other

## 2021-11-13 LAB — CBC
HCT: 35.3 % — ABNORMAL LOW (ref 36.0–46.0)
Hemoglobin: 11.5 g/dL — ABNORMAL LOW (ref 12.0–15.0)
MCH: 30.4 pg (ref 26.0–34.0)
MCHC: 32.6 g/dL (ref 30.0–36.0)
MCV: 93.4 fL (ref 80.0–100.0)
Platelets: 163 10*3/uL (ref 150–400)
RBC: 3.78 MIL/uL — ABNORMAL LOW (ref 3.87–5.11)
RDW: 14.6 % (ref 11.5–15.5)
WBC: 11.3 10*3/uL — ABNORMAL HIGH (ref 4.0–10.5)
nRBC: 0 % (ref 0.0–0.2)

## 2021-11-13 LAB — BASIC METABOLIC PANEL
Anion gap: 7 (ref 5–15)
BUN: 18 mg/dL (ref 8–23)
CO2: 25 mmol/L (ref 22–32)
Calcium: 8.4 mg/dL — ABNORMAL LOW (ref 8.9–10.3)
Chloride: 108 mmol/L (ref 98–111)
Creatinine, Ser: 1.03 mg/dL — ABNORMAL HIGH (ref 0.44–1.00)
GFR, Estimated: 55 mL/min — ABNORMAL LOW (ref >60)
Glucose, Bld: 154 mg/dL — ABNORMAL HIGH (ref 70–99)
Potassium: 4.1 mmol/L (ref 3.5–5.1)
Sodium: 140 mmol/L (ref 135–145)

## 2021-11-13 NOTE — Progress Notes (Signed)
Urology Inpatient Progress Report  Staghorn calculus [N20.0]  Procedure(s): NEPHROLITHOTOMY PERCUTANEOUS  1 Day Post-Op   Intv/Subj: No acute events overnight. Patient is without complaint. Foley has been removed and pt is voiding.  PCN tube clamped and no pain.    Principal Problem:   Staghorn calculus  Current Facility-Administered Medications  Medication Dose Route Frequency Provider Last Rate Last Admin   0.9 %  sodium chloride infusion  250 mL Intravenous PRN Jacalyn Lefevre D, MD       acetaminophen (TYLENOL) tablet 1,000 mg  1,000 mg Oral Q6H Jacalyn Lefevre D, MD   1,000 mg at 11/13/21 0554   ALPRAZolam (XANAX) tablet 0.25 mg  0.25 mg Oral QHS PRN Jacalyn Lefevre D, MD   0.25 mg at 11/12/21 2112   amLODipine (NORVASC) tablet 5 mg  5 mg Oral Daily Suede Greenawalt, Simone Curia D, MD       dextrose 5 %-0.45 % sodium chloride infusion   Intravenous Continuous Jacalyn Lefevre D, MD 75 mL/hr at 11/13/21 0634 New Bag at 11/13/21 5102   diphenhydrAMINE (BENADRYL) injection 12.5 mg  12.5 mg Intravenous Q6H PRN Jacalyn Lefevre D, MD       Or   diphenhydrAMINE (BENADRYL) 12.5 MG/5ML elixir 12.5 mg  12.5 mg Oral Q6H PRN Robley Fries, MD       furosemide (LASIX) tablet 40 mg  40 mg Oral Daily Robley Fries, MD       Derrill Memo ON 11/17/2021] levothyroxine (SYNTHROID) tablet 300 mcg  300 mcg Oral Once per day on Sun Chaz Ronning D, MD       And   levothyroxine (SYNTHROID) tablet 200 mcg  200 mcg Oral Once per day on Mon Tue Wed Thu Fri Sat Jacalyn Lefevre D, MD   200 mcg at 11/13/21 0556   morphine (PF) 2 MG/ML injection 2-4 mg  2-4 mg Intravenous Q2H PRN Robley Fries, MD       ondansetron (ZOFRAN) injection 4 mg  4 mg Intravenous Q4H PRN Robley Fries, MD       senna-docusate (Senokot-S) tablet 2 tablet  2 tablet Oral QHS Dalayza Zambrana D, MD       sodium chloride flush (NS) 0.9 % injection 3 mL  3 mL Intravenous Q12H Younis Mathey D, MD       sodium chloride flush (NS) 0.9 %  injection 3 mL  3 mL Intravenous PRN Jacalyn Lefevre D, MD       traMADol (ULTRAM) tablet 50 mg  50 mg Oral Q6H PRN Jacalyn Lefevre D, MD         Objective: Vital: Vitals:   11/12/21 1415 11/12/21 1515 11/12/21 1623 11/12/21 1834  BP: 133/72 132/78    Pulse: 71 85    Resp: 17 19    Temp:    98.2 F (36.8 C)  TempSrc:    Oral  SpO2: 98% 96%    Weight:   103 kg   Height:   '5\' 5"'$  (1.651 m)    I/Os: I/O last 3 completed shifts: In: 3630.5 [P.O.:220; I.V.:3096.8; IV Piggyback:313.7] Out: 2825 [Urine:2825]  Physical Exam:  General: Patient is in no apparent distress Lungs: Normal respiratory effort, chest expands symmetrically. GI:the abdomen is soft and nontender without mass. GU: Left PCN tube clamped with clear pink tinged urine in tube, Foley removed, dressing c/d/i Ext: lower extremities symmetric  Lab Results: Recent Labs    11/12/21 0800 11/12/21 1539 11/13/21 0422  WBC 5.7  --  11.3*  HGB 13.8 13.5 11.5*  HCT 43.2 42.5 35.3*   Recent Labs    11/12/21 0800 11/12/21 1539 11/13/21 0422  NA 140 142 140  K 4.0 3.7 4.1  CL 107 108 108  CO2 '25 26 25  '$ GLUCOSE 103* 140* 154*  BUN '13 12 18  '$ CREATININE 1.01* 1.07* 1.03*  CALCIUM 9.0 8.7* 8.4*   Recent Labs    11/12/21 0800  INR 1.1   No results for input(s): "LABURIN" in the last 72 hours. Results for orders placed or performed in visit on 06/20/21  Urine Culture     Status: None   Collection Time: 06/20/21 11:19 AM   Specimen: Urine, Clean Catch  Result Value Ref Range Status   MICRO NUMBER: [id number]  Final   SPECIMEN QUALITY: Adequate  Final   Sample Source NOT GIVEN  Final   STATUS: FINAL  Final   Result:   Final    Mixed genital flora isolated. These superficial bacteria are not indicative of a urinary tract infection. No further organism identification is warranted on this specimen. If clinically indicated, recollect clean-catch, mid-stream urine and transfer  immediately to Urine Culture Transport  Tube.     Studies/Results: DG C-Arm 1-60 Min-No Report  Result Date: 11/12/2021 Fluoroscopy was utilized by the requesting physician.  No radiographic interpretation.   DG C-Arm 1-60 Min-No Report  Result Date: 11/12/2021 Fluoroscopy was utilized by the requesting physician.  No radiographic interpretation.   IR URETERAL STENT PLACEMENT EXISTING ACCESS LEFT  Result Date: 11/12/2021 INDICATION: 81 year old woman with left staghorn calculus presents to IR for percutaneous nephroureteral catheter placement prior to PCNL. EXAM: Ultrasound and fluoroscopy guided left nephroureteral catheter placement COMPARISON:  None Available. MEDICATIONS: None ANESTHESIA/SEDATION: Moderate (conscious) sedation was employed during this procedure. A total of Versed 2.5 mg and Fentanyl 100 mcg was administered intravenously by the radiology nurse. Total intra-service moderate Sedation Time: 36 minutes. The patient's level of consciousness and vital signs were monitored continuously by radiology nursing throughout the procedure under my direct supervision. CONTRAST:  5 mL of Omnipaque 300-administered into the collecting system(s) FLUOROSCOPY: Radiation Exposure Index (as provided by the fluoroscopic device): 63 mGy Kerma COMPLICATIONS: None immediate. PROCEDURE: Informed written consent was obtained from the patient after a thorough discussion of the procedural risks, benefits and alternatives. All questions were addressed. Maximal Sterile Barrier Technique was utilized including caps, mask, sterile gowns, sterile gloves, sterile drape, hand hygiene and skin antiseptic. A timeout was performed prior to the initiation of the procedure. Patient position prone on the procedure table. Left flank skin prepped and draped in usual fashion. Ultrasound evaluation of the left kidney demonstrated multiple echogenic calculi. No significant hydronephrosis. Utilizing continuous ultrasound guidance, the upper pole calyx was accessed with  a 21 gauge needle as it was the most clearly visualized on ultrasound. Urine return was noted from the 21 gauge needle. Given the angle of access, it was unlikely that the mid and lower pole calices could be access for stone extraction from this angle. Therefore decision was made to access the lower pole calyx utilizing fluoroscopic guidance. The lower pole calyx was accessed with a 21 gauge needle utilizing fluoroscopic guidance. 0.018 inch guidewire was advanced into the proximal ureter through the 21 gauge needle. 21 gauge needle was exchanged for a transitional dilator set. Contrast administered through the transitional dilator set confirmed appropriate access of the renal collecting system. Transitional dilator set was removed over a stiff Glidewire. Glidewire and Kumpe catheter were advanced to  the bladder. Upper pole access was removed. Kumpe catheter secured to skin with silk suture and covered with sterile dressing. IMPRESSION: Successful access of left lower pole calyx with placement of nephroureteral catheter for purposes of lithotripsy. Electronically Signed   By: Miachel Roux M.D.   On: 11/12/2021 12:59    Assessment: Procedure(s): NEPHROLITHOTOMY PERCUTANEOUS, 1 Day Post-Op  doing well.  Plan: -regular diet -foley out -PCN clamped -IR to remove PCN under fluoro to not disrupt ureteral stent -keep overnight with plan to dc tomorrow AM -pt will need outpatient ureteroscopy in 2-4 weeks for clear remaining stone burden -po pain meds -home meds -SCDs while in bed -ambulate    Jacalyn Lefevre, MD Urology 11/13/2021, 8:40 AM

## 2021-11-13 NOTE — Progress Notes (Signed)
Request received for left PCN removal under fluoroscopy.   IR team called for the patient, she is in too much pain for procedure today.   Will remove the left PCN under fluoroscopy tomorrow pending IR schedule.  Please call IR for questions and concerns.    Armando Gang Everest Brod PA-C 11/13/2021 4:43 PM

## 2021-11-13 NOTE — Plan of Care (Signed)
  Problem: Education: Goal: Knowledge of General Education information will improve Description Including pain rating scale, medication(s)/side effects and non-pharmacologic comfort measures Outcome: Progressing   Problem: Health Behavior/Discharge Planning: Goal: Ability to manage health-related needs will improve Outcome: Progressing   

## 2021-11-13 NOTE — Plan of Care (Signed)

## 2021-11-13 NOTE — TOC Initial Note (Signed)
Transition of Care [facility]) - Initial/Assessment Note    Patient Details  Name: Cassidy [name] MRN: [id number] Date of Birth: 02/17/1941  Transition of Care [facility]) CM/SW Contact:    Leeroy Cha, RN Phone Number: 11/13/2021, 8:50 AM  Clinical Narrative:                  Transition of Care [facility]) Screening Note   Patient Details  Name: Cassidy [name] Date of Birth: 07/03/1940   Transition of Care [facility]) CM/SW Contact:    Leeroy Cha, RN Phone Number: 11/13/2021, 8:51 AM    Transition of Care Department [facility]) has reviewed patient and no TOC needs have been identified at this time. We will continue to monitor patient advancement through interdisciplinary progression rounds. If new patient transition needs arise, please place a TOC consult.    Expected Discharge Plan: Home/Self Care Barriers to Discharge: Continued Medical Work up   Patient Goals and CMS Choice Patient states their goals for this hospitalization and ongoing recovery are:: to go back home CMS Medicare.gov Compare Post Acute Care list provided to:: Patient Choice offered to / list presented to : Patient  Expected Discharge Plan and Services Expected Discharge Plan: Home/Self Care   Discharge Planning Services: CM Consult   Living arrangements for the past 2 months: Apartment                                      Prior Living Arrangements/Services Living arrangements for the past 2 months: Apartment Lives with:: Self Patient language and need for interpreter reviewed:: Yes Do you feel safe going back to the place where you live?: Yes            Criminal Activity/Legal Involvement Pertinent to Current Situation/Hospitalization: No - Comment as needed  Activities of Daily Living Home Assistive Devices/Equipment: None ADL Screening (condition at time of admission) Patient's cognitive ability adequate to safely complete daily activities?: Yes Is the patient deaf or have difficulty  hearing?: No Does the patient have difficulty seeing, even when wearing glasses/contacts?: No Does the patient have difficulty concentrating, remembering, or making decisions?: No Patient able to express need for assistance with ADLs?: Yes Does the patient have difficulty dressing or bathing?: No Independently performs ADLs?: Yes (appropriate for developmental age) Does the patient have difficulty walking or climbing stairs?: Yes Weakness of Legs: None Weakness of Arms/Hands: None  Permission Sought/Granted                  Emotional Assessment Appearance:: Appears stated age Attitude/Demeanor/Rapport: Engaged Affect (typically observed): Calm Orientation: : Oriented to Self, Oriented to Place, Oriented to  Time, Oriented to Situation Alcohol / Substance Use: Not Applicable Psych Involvement: No (comment)  Admission diagnosis:  Staghorn calculus [N20.0] Patient Active Problem List   Diagnosis Date Noted   Staghorn calculus 11/12/2021   Shortness of breath 11/06/2021   Pure hypercholesterolemia 11/06/2021   High risk surgery, pre-operative cardiovascular examination 11/06/2021   Hemangioma of skin and subcutaneous tissue 11/12/2020   Lentigo 11/12/2020   Melanocytic nevi of trunk 11/12/2020   Melanoma in situ of left lower limb, including hip ([place]) 11/12/2020   Neoplasm of uncertain behavior of skin 11/12/2020   Actinic keratosis 11/12/2020   Personal history of malignant melanoma of skin 11/12/2020   Personal history of other specified conditions 11/12/2020   Postoperative hypothyroidism 08/05/2019   Primary thyroid papillary  carcinoma ([place]) 08/05/2019   Multiple thyroid nodules 06/15/2019   Neoplasm of uncertain behavior of thyroid gland 05/06/2019   Total knee replacement status, left 12/06/2018   Goiter 12/04/2018   Degenerative arthritis of left knee 12/02/2018   Impaired fasting glucose 05/10/2018   Encounter for cosmetic surgery 07/16/2015   HAV (hallux abducto  valgus) 11/28/2014   Venous insufficiency (chronic) (peripheral) 07/05/2014   Lymphedema of both lower extremities 06/28/2014   Venous hypertension of both lower extremities 06/28/2014   Hypothyroidism due to acquired atrophy of thyroid 01/04/2014   Onychomycosis 02/10/2012   History of impaired glucose tolerance 11/02/2011   History of pulmonary embolism 11/02/2011   S/P right unicompartmental knee replacement 11/02/2011   Hypertension 08/27/2010   Hypothyroidism 08/27/2010   Obesity 08/27/2010   Migraine 08/27/2010   PCP:  Elby Showers, MD Pharmacy:   [place], [place] - [address] [place] [address] Phone: [phone] Fax: [phone]  [place] [address] Phone: [phone] Fax: [phone]     Social Determinants of Health (SDOH) Interventions    Readmission Risk Interventions     No data to display

## 2021-11-14 ENCOUNTER — Inpatient Hospital Stay (HOSPITAL_COMMUNITY): Payer: Medicare Other

## 2021-11-14 HISTORY — PX: IR NEPHROSTOGRAM LEFT THRU EXISTING ACCESS: IMG6061

## 2021-11-14 LAB — CBC WITH DIFFERENTIAL/PLATELET
Abs Immature Granulocytes: 0.04 10*3/uL (ref 0.00–0.07)
Basophils Absolute: 0.1 10*3/uL (ref 0.0–0.1)
Basophils Relative: 1 %
Eosinophils Absolute: 0.1 10*3/uL (ref 0.0–0.5)
Eosinophils Relative: 1 %
HCT: 36.1 % (ref 36.0–46.0)
Hemoglobin: 11.6 g/dL — ABNORMAL LOW (ref 12.0–15.0)
Immature Granulocytes: 1 %
Lymphocytes Relative: 13 %
Lymphs Abs: 1.1 10*3/uL (ref 0.7–4.0)
MCH: 30.1 pg (ref 26.0–34.0)
MCHC: 32.1 g/dL (ref 30.0–36.0)
MCV: 93.5 fL (ref 80.0–100.0)
Monocytes Absolute: 0.8 10*3/uL (ref 0.1–1.0)
Monocytes Relative: 10 %
Neutro Abs: 6.3 10*3/uL (ref 1.7–7.7)
Neutrophils Relative %: 74 %
Platelets: 158 10*3/uL (ref 150–400)
RBC: 3.86 MIL/uL — ABNORMAL LOW (ref 3.87–5.11)
RDW: 15.6 % — ABNORMAL HIGH (ref 11.5–15.5)
WBC: 8.4 10*3/uL (ref 4.0–10.5)
nRBC: 0 % (ref 0.0–0.2)

## 2021-11-14 MED ORDER — TRAMADOL HCL 50 MG PO TABS: 50.0000 mg | ORAL_TABLET | Freq: Four times a day (QID) | ORAL | 0 refills | Status: DC | PRN

## 2021-11-14 MED ORDER — CEPHALEXIN 500 MG PO CAPS: 500.0000 mg | ORAL_CAPSULE | Freq: Four times a day (QID) | ORAL | 0 refills | Status: AC

## 2021-11-14 MED ORDER — IOHEXOL 300 MG/ML  SOLN
10.0000 mL | Freq: Once | INTRAMUSCULAR | Status: AC | PRN
  Administered 2021-11-14: 10 mL

## 2021-11-14 MED ORDER — SODIUM CHLORIDE 0.9 % IV SOLN
1.0000 g | Freq: Once | INTRAVENOUS | Status: AC
  Administered 2021-11-14: 1 g via INTRAVENOUS
  Filled 2021-11-14: qty 10

## 2021-11-14 NOTE — Plan of Care (Signed)
  Problem: Activity: Goal: Risk for activity intolerance will decrease Outcome: Adequate for Discharge   Problem: Clinical Measurements: Goal: Cardiovascular complication will be avoided Outcome: Adequate for Discharge   Problem: Pain Managment: Goal: General experience of comfort will improve Outcome: Adequate for Discharge   Problem: Skin Integrity: Goal: Risk for impaired skin integrity will decrease Outcome: Adequate for Discharge   Problem: Safety: Goal: Ability to remain free from injury will improve Outcome: Adequate for Discharge

## 2021-11-14 NOTE — Progress Notes (Addendum)
Urology Inpatient Progress Report  Staghorn calculus [N20.0]  Procedure(s): NEPHROLITHOTOMY PERCUTANEOUS  2 Days Post-Op   Intv/Subj: Patient febrile x 1 yesterday afternoon.  Physician was not notified.  No pain.  PCN tube is capped and foley is out.    Principal Problem:   Staghorn calculus  Current Facility-Administered Medications  Medication Dose Route Frequency Provider Last Rate Last Admin   0.9 %  sodium chloride infusion  250 mL Intravenous PRN Robley Fries, MD       ALPRAZolam Duanne Moron) tablet 0.25 mg  0.25 mg Oral QHS PRN Jacalyn Lefevre D, MD   0.25 mg at 11/14/21 0021   amLODipine (NORVASC) tablet 5 mg  5 mg Oral Daily Jacalyn Lefevre D, MD   5 mg at 11/13/21 1535   cefTRIAXone (ROCEPHIN) 1 g in sodium chloride 0.9 % 100 mL IVPB  1 g Intravenous Once Jacalyn Lefevre D, MD       dextrose 5 %-0.45 % sodium chloride infusion   Intravenous Continuous Jacalyn Lefevre D, MD 75 mL/hr at 11/14/21 0600 Infusion Verify at 11/14/21 0600   diphenhydrAMINE (BENADRYL) injection 12.5 mg  12.5 mg Intravenous Q6H PRN Jacalyn Lefevre D, MD       Or   diphenhydrAMINE (BENADRYL) 12.5 MG/5ML elixir 12.5 mg  12.5 mg Oral Q6H PRN Jacalyn Lefevre D, MD       furosemide (LASIX) tablet 40 mg  40 mg Oral Daily Jacalyn Lefevre D, MD   40 mg at 11/13/21 1534   [START ON 11/17/2021] levothyroxine (SYNTHROID) tablet 300 mcg  300 mcg Oral Once per day on Sun Keyon Liller D, MD       And   levothyroxine (SYNTHROID) tablet 200 mcg  200 mcg Oral Once per day on Mon Tue Wed Thu Fri Sat Jacalyn Lefevre D, MD   200 mcg at 11/14/21 0542   morphine (PF) 2 MG/ML injection 2-4 mg  2-4 mg Intravenous Q2H PRN Jacalyn Lefevre D, MD       ondansetron (ZOFRAN) injection 4 mg  4 mg Intravenous Q4H PRN Robley Fries, MD       senna-docusate (Senokot-S) tablet 2 tablet  2 tablet Oral QHS Calista Crain, Simone Curia D, MD       sodium chloride flush (NS) 0.9 % injection 3 mL  3 mL Intravenous Q12H Jacalyn Lefevre D, MD   3  mL at 11/13/21 2048   sodium chloride flush (NS) 0.9 % injection 3 mL  3 mL Intravenous PRN Jacalyn Lefevre D, MD       traMADol (ULTRAM) tablet 50 mg  50 mg Oral Q6H PRN Jacalyn Lefevre D, MD   50 mg at 11/14/21 0021     Objective: Vital: Vitals:   11/12/21 1834 11/13/21 1700 11/13/21 2030 11/13/21 2116  BP:    108/63  Pulse:    89  Resp:    18  Temp: 98.2 F (36.8 C) (!) 102.3 F (39.1 C) 98.3 F (36.8 C) 99.1 F (37.3 C)  TempSrc: Oral Rectal Oral Oral  SpO2:    94%  Weight:      Height:       I/Os: I/O last 3 completed shifts: In: 2928.5 [P.O.:100; I.V.:2514.9; IV Piggyback:313.7] Out: 625 [Urine:625]  Physical Exam:  General: Patient is in no apparent distress Lungs: Normal respiratory effort, chest expands symmetrically. GI: The abdomen is soft and nontender without mass. L PCN tube capped Foley: out  Ext: lower extremities symmetric  Lab Results: Recent Labs  11/12/21 0800 11/12/21 1539 11/13/21 0422  WBC 5.7  --  11.3*  HGB 13.8 13.5 11.5*  HCT 43.2 42.5 35.3*   Recent Labs    11/12/21 0800 11/12/21 1539 11/13/21 0422  NA 140 142 140  K 4.0 3.7 4.1  CL 107 108 108  CO2 '25 26 25  '$ GLUCOSE 103* 140* 154*  BUN '13 12 18  '$ CREATININE 1.01* 1.07* 1.03*  CALCIUM 9.0 8.7* 8.4*   Recent Labs    11/12/21 0800  INR 1.1   No results for input(s): "LABURIN" in the last 72 hours. Results for orders placed or performed in visit on 06/20/21  Urine Culture     Status: None   Collection Time: 06/20/21 11:19 AM   Specimen: Urine, Clean Catch  Result Value Ref Range Status   MICRO NUMBER: [id number]  Final   SPECIMEN QUALITY: Adequate  Final   Sample Source NOT GIVEN  Final   STATUS: FINAL  Final   Result:   Final    Mixed genital flora isolated. These superficial bacteria are not indicative of a urinary tract infection. No further organism identification is warranted on this specimen. If clinically indicated, recollect clean-catch, mid-stream urine and  transfer  immediately to Urine Culture Transport Tube.     Studies/Results: CT ABDOMEN PELVIS WO CONTRAST  Result Date: 11/13/2021 CLINICAL DATA:  Left flank pain. Left ureteral calculus and percutaneous nephrostomy. EXAM: CT ABDOMEN AND PELVIS WITHOUT CONTRAST TECHNIQUE: Multidetector CT imaging of the abdomen and pelvis was performed following the standard protocol without IV contrast. RADIATION DOSE REDUCTION: This exam was performed according to the departmental dose-optimization program which includes automated exposure control, adjustment of the mA and/or kV according to patient size and/or use of iterative reconstruction technique. COMPARISON:  08/12/2021 from [facility] FINDINGS: Lower chest: Stable 4 mm pulmonary nodule in posterior right lower lobe. Hepatobiliary: No mass visualized on this unenhanced exam. Prior cholecystectomy. No evidence of biliary obstruction. Pancreas: No mass or inflammatory process visualized on this unenhanced exam. Spleen:  Within normal limits in size. Adrenals/Urinary tract: Several fluid attenuation renal cysts are again seen bilaterally. Moderate diffuse left renal parenchymal atrophy is again seen. A left percutaneous nephrostomy tube and double pigtail left ureteral stent are now seen in appropriate position, without hydronephrosis. Several stone fragments persist in left renal calices, measuring up to 12 mm. No stone fragments are seen within the left ureter, along the course of the stent. A Foley catheter is seen within the bladder, which is nearly empty. Stomach/Bowel: Small hiatal hernia noted. No evidence of obstruction, inflammatory process, or abnormal fluid collections. Diverticulosis is seen mainly involving the sigmoid colon, however there is no evidence of diverticulitis. Vascular/Lymphatic: No pathologically enlarged lymph nodes identified. No evidence of abdominal aortic aneurysm. Aortic atherosclerotic calcification incidentally  noted. Reproductive: Prior hysterectomy noted. Adnexal regions are unremarkable in appearance. Other:  None. Musculoskeletal:  No suspicious bone lesions identified. IMPRESSION: Left percutaneous nephrostomy tube and double pigtail left ureteral stent in appropriate position, without hydronephrosis. Several stone fragments in left renal calices, measuring up to 12 mm. No left ureteral calculi or stone fragments seen. Stable diffuse left renal parenchymal atrophy. Colonic diverticulosis. No radiographic evidence of diverticulitis. Small hiatal hernia. Electronically Signed   By: Marlaine Hind M.D.   On: 11/13/2021 08:38   DG C-Arm 1-60 Min-No Report  Result Date: 11/12/2021 Fluoroscopy was utilized by the requesting physician.  No radiographic interpretation.   DG C-Arm 1-60 Min-No Report  Result Date:  11/12/2021 Fluoroscopy was utilized by the requesting physician.  No radiographic interpretation.   IR URETERAL STENT PLACEMENT EXISTING ACCESS LEFT  Result Date: 11/12/2021 INDICATION: 81 year old woman with left staghorn calculus presents to IR for percutaneous nephroureteral catheter placement prior to PCNL. EXAM: Ultrasound and fluoroscopy guided left nephroureteral catheter placement COMPARISON:  None Available. MEDICATIONS: None ANESTHESIA/SEDATION: Moderate (conscious) sedation was employed during this procedure. A total of Versed 2.5 mg and Fentanyl 100 mcg was administered intravenously by the radiology nurse. Total intra-service moderate Sedation Time: 36 minutes. The patient's level of consciousness and vital signs were monitored continuously by radiology nursing throughout the procedure under my direct supervision. CONTRAST:  5 mL of Omnipaque 300-administered into the collecting system(s) FLUOROSCOPY: Radiation Exposure Index (as provided by the fluoroscopic device): 63 mGy Kerma COMPLICATIONS: None immediate. PROCEDURE: Informed written consent was obtained from the patient after a thorough  discussion of the procedural risks, benefits and alternatives. All questions were addressed. Maximal Sterile Barrier Technique was utilized including caps, mask, sterile gowns, sterile gloves, sterile drape, hand hygiene and skin antiseptic. A timeout was performed prior to the initiation of the procedure. Patient position prone on the procedure table. Left flank skin prepped and draped in usual fashion. Ultrasound evaluation of the left kidney demonstrated multiple echogenic calculi. No significant hydronephrosis. Utilizing continuous ultrasound guidance, the upper pole calyx was accessed with a 21 gauge needle as it was the most clearly visualized on ultrasound. Urine return was noted from the 21 gauge needle. Given the angle of access, it was unlikely that the mid and lower pole calices could be access for stone extraction from this angle. Therefore decision was made to access the lower pole calyx utilizing fluoroscopic guidance. The lower pole calyx was accessed with a 21 gauge needle utilizing fluoroscopic guidance. 0.018 inch guidewire was advanced into the proximal ureter through the 21 gauge needle. 21 gauge needle was exchanged for a transitional dilator set. Contrast administered through the transitional dilator set confirmed appropriate access of the renal collecting system. Transitional dilator set was removed over a stiff Glidewire. Glidewire and Kumpe catheter were advanced to the bladder. Upper pole access was removed. Kumpe catheter secured to skin with silk suture and covered with sterile dressing. IMPRESSION: Successful access of left lower pole calyx with placement of nephroureteral catheter for purposes of lithotripsy. Electronically Signed   By: Miachel Roux M.D.   On: 11/12/2021 12:59    Assessment: Procedure(s): NEPHROLITHOTOMY PERCUTANEOUS, 2 Days Post-Op  doing well.  Plan: -foley out and PCN capped; plan for IR to remove under fluoro -regular diet -OOB -home meds -pain meds  PRN -restart abx and check CBC -if remains afebrile will send home on PO abx -anticipate dc later today    Jacalyn Lefevre, MD Urology 11/14/2021, 7:21 AM

## 2021-11-14 NOTE — Procedures (Signed)
Interventional Radiology Procedure:   Indications: Recent left nephrolithotomy procedure  Procedure: Left nephrostogram and left nephrostomy tube removal  Findings: Left ureter / stent are patent.  Nephrostomy tube removed under fluoroscopy without disturbing ureter stent.  Complications: No immediate complications noted.     EBL: Minimal  Plan: Return to inpatient room.   Neal Trulson R. Anselm Pancoast, MD  Pager: [phone]

## 2021-11-14 NOTE — Plan of Care (Signed)
  Problem: Education: Goal: Knowledge of General Education information will improve Description Including pain rating scale, medication(s)/side effects and non-pharmacologic comfort measures Outcome: Progressing   Problem: Health Behavior/Discharge Planning: Goal: Ability to manage health-related needs will improve Outcome: Progressing   

## 2021-11-14 NOTE — TOC Transition Note (Signed)
Transition of Care [facility]) - CM/SW Discharge Note   Patient Details  Name: Cassidy [name] MRN: [id number] Date of Birth: [birth year]  Transition of [facility]) CM/SW Contact:  Leeroy Cha, RN Phone Number: 11/14/2021, 12:45 PM   Clinical Narrative:    Chart reviewed for toc needs for dc none found.     Barriers to Discharge: Continued Medical Work up   Patient Goals and CMS Choice Patient states their goals for this hospitalization and ongoing recovery are:: to go back home CMS Medicare.gov Compare Post Acute Care list provided to:: Patient Choice offered to / list presented to : Patient  Discharge Placement                       Discharge Plan and Services   Discharge Planning Services: CM Consult                                 Social Determinants of Health (SDOH) Interventions     Readmission Risk Interventions     No data to display

## 2021-11-14 NOTE — Discharge Summary (Signed)
Date of admission: 11/12/2021  Date of discharge: 11/14/2021  Admission diagnosis: left staghorn calculus  Discharge diagnosis: left staghorn calculus  Secondary diagnoses:  Patient Active Problem List   Diagnosis Date Noted   Staghorn calculus 11/12/2021   Shortness of breath 11/06/2021   Pure hypercholesterolemia 11/06/2021   High risk surgery, pre-operative cardiovascular examination 11/06/2021   Hemangioma of skin and subcutaneous tissue 11/12/2020   Lentigo 11/12/2020   Melanocytic nevi of trunk 11/12/2020   Melanoma in situ of left lower limb, including hip ([place]) 11/12/2020   Neoplasm of uncertain behavior of skin 11/12/2020   Actinic keratosis 11/12/2020   Personal history of malignant melanoma of skin 11/12/2020   Personal history of other specified conditions 11/12/2020   Postoperative hypothyroidism 08/05/2019   Primary thyroid papillary carcinoma ([place]) 08/05/2019   Multiple thyroid nodules 06/15/2019   Neoplasm of uncertain behavior of thyroid gland 05/06/2019   Total knee replacement status, left 12/06/2018   Goiter 12/04/2018   Degenerative arthritis of left knee 12/02/2018   Impaired fasting glucose 05/10/2018   Encounter for cosmetic surgery 07/16/2015   HAV (hallux abducto valgus) 11/28/2014   Venous insufficiency (chronic) (peripheral) 07/05/2014   Lymphedema of both lower extremities 06/28/2014   Venous hypertension of both lower extremities 06/28/2014   Hypothyroidism due to acquired atrophy of thyroid 01/04/2014   Onychomycosis 02/10/2012   History of impaired glucose tolerance 11/02/2011   History of pulmonary embolism 11/02/2011   S/P right unicompartmental knee replacement 11/02/2011   Hypertension 08/27/2010   Hypothyroidism 08/27/2010   Obesity 08/27/2010   Migraine 08/27/2010    Procedures performed: Procedure(s): NEPHROLITHOTOMY PERCUTANEOUS  History and Physical: For full details, please see admission history and physical. Briefly, Cassidy [name] is a 81 y.o. year old patient with left staghorn calculus who underwent Left PCNL.   Hospital Course: Patient tolerated the procedure well.  She was then transferred to the floor after an uneventful PACU stay.  Her hospital course was uncomplicated.  On POD#2 she had met discharge criteria: was eating a regular diet, was up and ambulating independently,  pain was well controlled, was voiding without a catheter, and was ready to for discharge.     Laboratory values:  Recent Labs    11/12/21 0800 11/12/21 1539 11/13/21 0422 11/14/21 0748  WBC 5.7  --  11.3* 8.4  HGB 13.8 13.5 11.5* 11.6*  HCT 43.2 42.5 35.3* 36.1   Recent Labs    11/12/21 0800 11/12/21 1539 11/13/21 0422  NA 140 142 140  K 4.0 3.7 4.1  CL 107 108 108  CO2 _0 GLUCOSE 103* 140* 154*  BUN _1 CREATININE 1.01* 1.07* 1.03*  CALCIUM 9.0 8.7* 8.4*   Recent Labs    11/12/21 0800  INR 1.1   No results for input(s): "LABURIN" in the last 72 hours. Results for orders placed or performed in visit on 06/20/21  Urine Culture     Status: None   Collection Time: 06/20/21 11:19 AM   Specimen: Urine, Clean Catch  Result Value Ref Range Status   MICRO NUMBER: [id number]  Final   SPECIMEN QUALITY: Adequate  Final   Sample Source NOT GIVEN  Final   STATUS: FINAL  Final   Result:   Final    Mixed genital flora isolated. These superficial bacteria are not indicative of a urinary tract infection. No further organism identification is warranted on this specimen. If clinically indicated, recollect clean-catch, mid-stream urine and transfer  immediately to Urine Culture Transport Tube.     Disposition: Home  Discharge instruction: The patient was instructed to be ambulatory but told to refrain from heavy lifting, strenuous activity, or driving.   Discharge medications:  Allergies as of 11/14/2021   No Known Allergies      Medication List     TAKE these medications    acetaminophen 325 MG  tablet Commonly known as: TYLENOL Take 650 mg by mouth every 6 (six) hours as needed for moderate pain or headache.   ALPRAZolam 0.25 MG tablet Commonly known as: XANAX Take 1 tablet (0.25 mg total) by mouth at bedtime as needed for anxiety.   amLODipine 5 MG tablet Commonly known as: NORVASC TAKE ONE TABLET DAILY   cephALEXin 500 MG capsule Commonly known as: KEFLEX Take 1 capsule (500 mg total) by mouth 4 (four) times daily for 10 days.   furosemide 40 MG tablet Commonly known as: LASIX TAKE ONE TABLET DAILY   levothyroxine 200 MCG tablet Commonly known as: SYNTHROID Take 1 tablet (200 mcg total) by mouth as directed. 1.5 tablets on Sunday, 1 tablet daily Monday through Saturday   promethazine 25 MG tablet Commonly known as: PHENERGAN Take 1 tablet (25 mg total) by mouth every 8 (eight) hours as needed for nausea or vomiting.   traMADol 50 MG tablet Commonly known as: Ultram Take 1 tablet (50 mg total) by mouth every 6 (six) hours as needed.   trimethoprim 100 MG tablet Commonly known as: TRIMPEX Take 100 mg by mouth in the morning and at bedtime.               Discharge Care Instructions  (From admission, onward)           Start     Ordered   11/14/21 0000  Discharge wound care:       Comments: Change dressing over left flank as needed.  Should stop draining within 72 hours.   11/14/21 1159            Followup:   Follow-up Information     [facility] Follow up.   Contact information: [address] [place] [phone]                You will be contacted with your next surgery date with Dr. Claudia Desanctis

## 2021-11-15 ENCOUNTER — Other Ambulatory Visit: Payer: Self-pay | Admitting: Urology

## 2021-11-15 ENCOUNTER — Other Ambulatory Visit: Payer: Self-pay | Admitting: *Deleted

## 2021-11-15 NOTE — Patient Outreach (Signed)
  Care Coordination [facility] Note Transition Care Management Follow-up Telephone Call Date of discharge and from where: [facility] [phone] How have you been since you were released from the hospital? Doing good Any questions or concerns? No  Items Reviewed: Did the pt receive and understand the discharge instructions provided? Yes  Medications obtained and verified? Yes  Other? No  Any new allergies since your discharge? No  Dietary orders reviewed? Y low sodium heart healthy   Do you have support at home? Yes   Home Care and Equipment/Supplies: Were home health services ordered? no If so, what is the name of the agency? N/a  Has the agency set up a time to come to the patient's home? not applicable Were any new equipment or medical supplies ordered?  No What is the name of the medical supply agency? N/a  Were you able to get the supplies/equipment? not applicable Do you have any questions related to the use of the equipment or supplies? No  Functional Questionnaire: (I = Independent and D = Dependent) ADLs: I  Bathing/Dressing- I  Meal Prep- I  Eating- I  Maintaining continence- I  Transferring/Ambulation- I  Managing Meds- I  Follow up appointments reviewed:  PCP Hospital f/u appt confirmed? No  [facility] f/u appt confirmed? Yes  [facility] she has it written down somewhere Are transportation arrangements needed? No  If their condition worsens, is the pt aware to call PCP or go to the Emergency Dept.? Yes Was the patient provided with contact information for the PCP's office or ED? Yes Was to pt encouraged to call back with questions or concerns? Yes  SDOH assessments and interventions completed:   y   Care Coordination Interventions Activated:  No Care Coordination Interventions:   N/A  Encounter Outcome:  Pt. Visit Completed

## 2021-11-25 ENCOUNTER — Ambulatory Visit (INDEPENDENT_AMBULATORY_CARE_PROVIDER_SITE_OTHER): Payer: Medicare Other | Admitting: Podiatry

## 2021-11-25 DIAGNOSIS — B351 Tinea unguium: Secondary | ICD-10-CM | POA: Diagnosis not present

## 2021-11-25 DIAGNOSIS — Z7901 Long term (current) use of anticoagulants: Secondary | ICD-10-CM

## 2021-11-25 DIAGNOSIS — Q828 Other specified congenital malformations of skin: Secondary | ICD-10-CM

## 2021-11-25 DIAGNOSIS — M79676 Pain in unspecified toe(s): Secondary | ICD-10-CM

## 2021-11-25 NOTE — Progress Notes (Signed)
Patient ID: Cassidy [name], female   DOB: Sep 11, 1940, 81 y.o.   MRN: [id number]  Subjective: Cassidy [name], 81 year old female presents the office today for concerns of thick painful toenails and calluses to both of her feet as well as calluses.  No open lesions.   Objective: AAO 3, NAD DP/PT pulses are palpable, CRT less than 3 seconds  Chronic edema present bilaterally Protective sensation absent with Cassidy [name] monofilament Thick hyperkeratotic tissue plantar aspect right hallux with dried blood.  There is no underlying ulceration.  Appears to be stable.  Hyperkeratotic lesion also noted submetatarsal left foot as well as right hallux.  No underlying ulceration. Nails are hypertrophic, dystrophic, brittle, discolored, elongated 9. No surrounding redness or drainage. Tenderness nails 1-5 bilaterally except for left second toe which has been amputated. No pain with calf compression, swelling, warmth, erythema.  Assessment: 81 year old female with pre-ulcerative calluses bilaterally, symptomatic onychomycosis  Plan: -Treatment options discussed including all alternatives, risks, and complications -Nail sharply debrided 9 without any complications or bleeding -Hyperkeratotic lesion sharply debrided 4 without complications. Monitor for any skin breakdown. Continue offloading. Moisturizer  -Continue compression, elevation. -Discussed the importance of daily foot inspection -Follow-up as scheduled or sooner if any problems arise. In the meantime, encouraged to call the office with any questions, concerns, change in symptoms.   Cassidy [name], DPM

## 2021-11-28 ENCOUNTER — Other Ambulatory Visit: Payer: Self-pay

## 2021-11-28 ENCOUNTER — Encounter (HOSPITAL_BASED_OUTPATIENT_CLINIC_OR_DEPARTMENT_OTHER): Payer: Self-pay | Admitting: Urology

## 2021-11-28 NOTE — Progress Notes (Signed)
Spoke w/ via phone for pre-op interview--- Cassidy [name] Lab needs dos---- [employer]          Lab results------ EKG in epic COVID test -----patient states asymptomatic no test needed Arrive at ------- 08:30 am NPO after MN NO Solid Food.  Clear liquids from MN until--- 07:30 am Med rec completed  yes Medications to take morning of surgery ----- Norvasc, Synthroid, Trimpex Diabetic medication ----- N/A Patient instructed no nail polish to be worn day of surgery yes Patient instructed to bring photo id and insurance card day of surgery yes Patient aware to have Driver (ride ) / caregiver for 24 hours after surgery yes, DTR [name] [phone] Patient Special Instructions ----- Pre-Op special Istructions ----- Patient verbalized understanding of instructions that were given at this phone interview. yes Patient denies shortness of breath, chest pain, fever, cough at this phone interview. yes

## 2021-12-04 DIAGNOSIS — N200 Calculus of kidney: Secondary | ICD-10-CM | POA: Diagnosis not present

## 2021-12-06 NOTE — H&P (Signed)
CC/HPI: cc: Staghorn calculus   09/06/2021: 81 year old woman referred for management of staghorn calculus. She been treated for ongoing UTI for approximately 2 months. Imaging showed large staghorn calculus. Patient denies any significant pain from the stone but does have pain after long periods of standing. She has undergone surgery in the past and tolerated anesthesia well. She is interested in having the stone removed. She has a lot of family in [place] for support.   10/28/2021: Patient here today for preoperative appointment prior to undergoing left PCNL to treat previously identified staghorn calculus in the left kidney on 7/25 with Dr. Claudia Desanctis. Patient remains on trimethoprim for bacterial suppression due to to recurrent UTI. She ran out of the medication about 2 days ago. She is not having any recurrence of UTI symptoms including absence of dysuria, gross hematuria. She does continue to have some midline and left-sided lower back pain/discomfort but this usually occurs with activity and position changes but resolves at rest. She denies any interval stone material passage. Patient denies any changes in past medical history, prescription medications taken on daily basis, no interval surgical or procedural intervention. She denies any recent fevers or chills, nausea/vomiting. She has had no recent chest pain, shortness of breath, lightheadedness or dizziness, numbness or tingling in the extremities.   12/04/2021: 81 year old woman with a history of left staghorn calculus who underwent left PCNL on 11/12/2021. She completed postoperative cephalexin and is resumed trimethoprim for bacterial suppression at night. She is doing well since surgery and not had any pain. She still has an indwelling stent on the left side. She is scheduled for left ureteroscopy next week for residual stone burden.     ALLERGIES: None   MEDICATIONS: Aspirin  Levothyroxine Sodium 200 mcg tablet  Amlodipine Besylate  Furosemide   Trimethoprim 100 mg tablet 1 tablet PO Q HS     GU PSH: Cystoscopy - 07/24/2021 Locm 300-'399Mg'$ /Ml Iodine,1Ml - 08/12/2021 Percut Stone Removal >2cm - 11/12/2021     NON-GU PSH: Knee replacement, Bilateral Removal of skin cancer Thyroid Surgery     GU PMH: Renal calculus - 10/28/2021, - 09/06/2021, - 08/19/2021 Urinary Tract Inf, Unspec site - 08/19/2021, - 07/24/2021 Microscopic hematuria - 08/12/2021, - 07/24/2021 Nocturia - 07/24/2021 Urge incontinence - 07/24/2021 Urinary Frequency - 07/24/2021    NON-GU PMH: Arthritis Cardiac murmur, unspecified DVT, History Hypertension Hypothyroidism Skin Cancer, History    FAMILY HISTORY: 1 Daughter - Daughter 1 son - Son Heart trouble - Mother Myocardial Infarction - Father   SOCIAL HISTORY: Marital Status: Divorced Preferred Language: English; Ethnicity: Not Hispanic Or Latino; Race: White Current Smoking Status: Patient has never smoked.   Tobacco Use Assessment Completed: Used Tobacco in last 30 days? Does not use smokeless tobacco. Has never drank.  Drinks 3 caffeinated drinks per day. 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    REVIEW OF SYSTEMS:    GU Review Female:   Patient denies frequent urination, hard to postpone urination, burning /pain with urination, get up at night to urinate, leakage of urine, stream starts and stops, trouble starting your stream, have to strain to urinate, and being pregnant.  Gastrointestinal (Upper):   Patient denies nausea, vomiting, and indigestion/ heartburn.  Gastrointestinal (Lower):   Patient denies diarrhea and constipation.  Constitutional:   Patient denies fever, night sweats, weight loss, and fatigue.  Skin:   Patient denies skin rash/ lesion and itching.  Eyes:   Patient denies blurred vision and double vision.  Ears/ Nose/ Throat:  Patient denies sore throat and sinus problems.  Hematologic/Lymphatic:   Patient denies swollen glands and easy bruising.  Cardiovascular:    Patient denies leg swelling and chest pains.  Respiratory:   Patient denies cough and shortness of breath.  Endocrine:   Patient denies excessive thirst.  Musculoskeletal:   Patient denies back pain and joint pain.  Neurological:   Patient denies headaches and dizziness.  Psychologic:   Patient denies depression and anxiety.   VITAL SIGNS:      12/04/2021 08:54 AM  BP 152/82 mmHg  Pulse 87 /min  Temperature 98.4 F / 36.8 C   MULTI-SYSTEM PHYSICAL EXAMINATION:    Constitutional: Well-nourished. No physical deformities. Normally developed. Good grooming.  Neck: Neck symmetrical, not swollen. Normal tracheal position.  Respiratory: No labored breathing, no use of accessory muscles.   Skin: No paleness, no jaundice, no cyanosis. No lesion, no ulcer, no rash.  Neurologic / Psychiatric: Oriented to time, oriented to place, oriented to person. No depression, no anxiety, no agitation.  Eyes: Normal conjunctivae. Normal eyelids.  Ears, Nose, Mouth, and Throat: Left ear no scars, no lesions, no masses. Right ear no scars, no lesions, no masses. Nose no scars, no lesions, no masses. Normal hearing. Normal lips.  Musculoskeletal: Normal gait and station of head and neck.     Complexity of Data:  Records Review:   Previous Patient Records, POC Tool  Urine Test Review:   Urinalysis  X-Ray Review: KUB: Reviewed Films. Discussed With Patient. Patient with significantly decreased stone burden in the left side however renal calculi still present    PROCEDURES:         KUB - 22979  A single view of the abdomen is obtained.      Patient confirmed No Neulasta OnPro Device.            Urinalysis w/Scope - 81001 Dipstick Dipstick Cont'd Micro  Color: Yellow Bilirubin: Neg WBC/hpf: 20 - 40/hpf  Appearance: Slightly Cloudy Ketones: Neg RBC/hpf: 20 - 40/hpf  Specific Gravity: 1.025 Blood: 3+ Bacteria: Many (>50/hpf)  pH: <=5.0 Protein: Neg Cystals: NS (Not Seen)  Glucose: Neg Urobilinogen: 0.2  Casts: NS (Not Seen)    Nitrites: Neg Trichomonas: Not Present    Leukocyte Esterase: 3+ Mucous: Not Present      Epithelial Cells: 0 - 5/hpf      Yeast: NS (Not Seen)      Sperm: Not Present    Notes:      ASSESSMENT:      ICD-10 Details  1 GU:   Renal calculus - N20.0 Chronic, Stable   PLAN:           Orders Labs CULTURE, URINE          Schedule X-Rays: Today 12/03/2021 - KUB          Document Letter(s):  Created for Patient: Clinical Summary         Notes:   Urolithiasis:  -Reviewed patient's KUB which does show stone burden significant of to warrant repeat ureteroscopy which is scheduled for next week  -Risks and benefits of the procedure were discussed with the patient in detail and she is like to proceed  -She will continue on nightly trimethoprim for prophylaxis

## 2021-12-09 ENCOUNTER — Other Ambulatory Visit: Payer: Self-pay | Admitting: Urology

## 2021-12-10 ENCOUNTER — Ambulatory Visit (HOSPITAL_BASED_OUTPATIENT_CLINIC_OR_DEPARTMENT_OTHER): Admission: RE | Admit: 2021-12-10 | Payer: Medicare Other | Source: Home / Self Care | Admitting: Urology

## 2021-12-10 DIAGNOSIS — I10 Essential (primary) hypertension: Secondary | ICD-10-CM

## 2021-12-10 HISTORY — DX: Personal history of urinary calculi: Z87.442

## 2021-12-10 SURGERY — CYSTOURETEROSCOPY, WITH RETROGRADE PYELOGRAM AND STENT INSERTION
Anesthesia: General | Laterality: Left

## 2021-12-10 NOTE — Progress Notes (Signed)
Received voice mail message from pt today @ 0430.  Pt stated that she has fever 101 / diarrhea.  Stated she will not be here today for surgery by Dr Claudia Desanctis.  Message has been left with OR scheduler for Dr Claudia Desanctis to inform them.

## 2021-12-13 NOTE — Patient Instructions (Signed)
SURGICAL WAITING ROOM VISITATION Patients having surgery or a procedure may have no more than 2 support people in the waiting area - these visitors may rotate.   Children under the age of 45 must have an adult with them who is not the patient. If the patient needs to stay at the hospital during part of their recovery, the visitor guidelines for inpatient rooms apply. Pre-op nurse will coordinate an appropriate time for 1 support person to accompany patient in pre-op.  This support person may not rotate.    Please refer to the [facility] website for the visitor guidelines for Inpatients (after your surgery is over and you are in a regular room).       Your procedure is scheduled on:  12/24/2021    Report to [facility] Main Entrance    Report to admitting at   1000 am    Call this number if you have problems the morning of surgery [phone]   Do not eat food :After Midnight.    After Midnight you may have the following liquids until ___0900___ AM DAY OF SURGERY  Water Non-Citrus Juices (without pulp, NO RED) Carbonated Beverages Black Coffee (NO MILK/CREAM OR CREAMERS, sugar ok)  Clear Tea (NO MILK/CREAM OR CREAMERS, sugar ok) regular and decaf                             Plain Jell-O (NO RED)                                           Fruit ices (not with fruit pulp, NO RED)                                     Popsicles (NO RED)                                                               Sports drinks like Gatorade (NO RED)                          Oral Hygiene is also important to reduce your risk of infection.                                    Remember - BRUSH YOUR TEETH THE MORNING OF SURGERY WITH YOUR REGULAR TOOTHPASTE   Do NOT smoke after Midnight   Take these medicines the morning of surgery with A SIP OF WATER:  amlodipine, synthroid   DO NOT TAKE ANY ORAL DIABETIC MEDICATIONS DAY OF YOUR SURGERY  Bring CPAP mask and tubing day of surgery.                               You may not have any metal on your body including hair pins, jewelry, and body piercing             Do not  wear make-up, lotions, powders, perfumes/cologne, or deodorant  Do not wear nail polish including gel and S&S, artificial/acrylic nails, or any other type of covering on natural nails including finger and toenails. If you have artificial nails, gel coating, etc. that needs to be removed by a nail salon please have this removed prior to surgery or surgery may need to be canceled/ delayed if the surgeon/ anesthesia feels like they are unable to be safely monitored.   Do not shave  48 hours prior to surgery.               Men may shave face and neck.   Do not bring valuables to the hospital. [place].   Contacts, dentures or bridgework may not be worn into surgery.   Bring small overnight bag day of surgery.   DO NOT [place]. PHARMACY WILL DISPENSE MEDICATIONS LISTED ON YOUR MEDICATION LIST TO YOU DURING YOUR ADMISSION [place]!    Patients discharged on the day of surgery will not be allowed to drive home.  Someone NEEDS to stay with you for the first 24 hours after anesthesia.   Special Instructions: Bring a copy of your healthcare power of attorney and living will documents         the day of surgery if you haven't scanned them before.              Please read over the following fact sheets you were given: IF YOU HAVE QUESTIONS ABOUT YOUR PRE-OP INSTRUCTIONS PLEASE CALL [phone]     [facility] - Preparing for Surgery Before surgery, you can play an important role.  Because skin is not sterile, your skin needs to be as free of germs as possible.  You can reduce the number of germs on your skin by washing with CHG (chlorahexidine gluconate) soap before surgery.  CHG is an antiseptic cleaner which kills germs and bonds with the skin to continue killing germs even after  washing. Please DO NOT use if you have an allergy to CHG or antibacterial soaps.  If your skin becomes reddened/irritated stop using the CHG and inform your nurse when you arrive at Short Stay. Do not shave (including legs and underarms) for at least 48 hours prior to the first CHG shower.  You may shave your face/neck. Please follow these instructions carefully:  1.  Shower with CHG Soap the night before surgery and the  morning of Surgery.  2.  If you choose to wash your hair, wash your hair first as usual with your  normal  shampoo.  3.  After you shampoo, rinse your hair and body thoroughly to remove the  shampoo.                           4.  Use CHG as you would any other liquid soap.  You can apply chg directly  to the skin and wash                       Gently with a scrungie or clean washcloth.  5.  Apply the CHG Soap to your body ONLY FROM THE NECK DOWN.   Do not use on face/ open  Wound or open sores. Avoid contact with eyes, ears mouth and genitals (private parts).                       Wash face,  Genitals (private parts) with your normal soap.             6.  Wash thoroughly, paying special attention to the area where your surgery  will be performed.  7.  Thoroughly rinse your body with warm water from the neck down.  8.  DO NOT shower/wash with your normal soap after using and rinsing off  the CHG Soap.                9.  Pat yourself dry with a clean towel.            10.  Wear clean pajamas.            11.  Place clean sheets on your bed the night of your first shower and do not  sleep with pets. Day of Surgery : Do not apply any lotions/deodorants the morning of surgery.  Please wear clean clothes to the hospital/surgery center.  FAILURE TO FOLLOW THESE INSTRUCTIONS MAY RESULT IN THE CANCELLATION OF YOUR SURGERY PATIENT SIGNATURE_________________________________  NURSE  SIGNATURE__________________________________  ________________________________________________________________________

## 2021-12-13 NOTE — Progress Notes (Signed)
Anesthesia Review:  PCP: Cardiologist : Chest x-ray : 10/14/21  EKG : 11/06/2021  Echo : Stress test: 11/08/21  Cardiac Cath :  Activity level:  Sleep Study/ CPAP : Fasting Blood Sugar :      / Checks Blood Sugar -- times a day:   Blood Thinner/ Instructions /Last Dose: ASA / Instructions/ Last Dose :   12/10/21 cysto cancelled due to fever and diarrhea.

## 2021-12-16 ENCOUNTER — Encounter (HOSPITAL_COMMUNITY)
Admission: RE | Admit: 2021-12-16 | Discharge: 2021-12-16 | Disposition: A | Discharge status: Home / Self Care | Payer: Medicare Other | Source: Ambulatory Visit | Attending: Urology | Admitting: Urology

## 2021-12-16 ENCOUNTER — Encounter (HOSPITAL_COMMUNITY): Payer: Self-pay

## 2021-12-16 ENCOUNTER — Other Ambulatory Visit: Payer: Self-pay

## 2021-12-16 DIAGNOSIS — Z01818 Encounter for other preprocedural examination: Secondary | ICD-10-CM

## 2021-12-16 DIAGNOSIS — Z01812 Encounter for preprocedural laboratory examination: Secondary | ICD-10-CM | POA: Insufficient documentation

## 2021-12-16 DIAGNOSIS — I10 Essential (primary) hypertension: Secondary | ICD-10-CM | POA: Diagnosis not present

## 2021-12-16 LAB — BASIC METABOLIC PANEL
Anion gap: 7 (ref 5–15)
BUN: 20 mg/dL (ref 8–23)
CO2: 26 mmol/L (ref 22–32)
Calcium: 8.9 mg/dL (ref 8.9–10.3)
Chloride: 107 mmol/L (ref 98–111)
Creatinine, Ser: 1 mg/dL (ref 0.44–1.00)
GFR, Estimated: 57 mL/min — ABNORMAL LOW (ref >60)
Glucose, Bld: 107 mg/dL — ABNORMAL HIGH (ref 70–99)
Potassium: 4.5 mmol/L (ref 3.5–5.1)
Sodium: 140 mmol/L (ref 135–145)

## 2021-12-16 LAB — CBC
HCT: 42.3 % (ref 36.0–46.0)
Hemoglobin: 13.4 g/dL (ref 12.0–15.0)
MCH: 30 pg (ref 26.0–34.0)
MCHC: 31.7 g/dL (ref 30.0–36.0)
MCV: 94.6 fL (ref 80.0–100.0)
Platelets: 184 10*3/uL (ref 150–400)
RBC: 4.47 MIL/uL (ref 3.87–5.11)
RDW: 14.4 % (ref 11.5–15.5)
WBC: 5.8 10*3/uL (ref 4.0–10.5)
nRBC: 0 % (ref 0.0–0.2)

## 2021-12-20 NOTE — H&P (Signed)
CC/HPI: cc: Staghorn calculus   09/06/2021: 81 year old woman referred for management of staghorn calculus. She been treated for ongoing UTI for approximately 2 months. Imaging showed large staghorn calculus. Patient denies any significant pain from the stone but does have pain after long periods of standing. She has undergone surgery in the past and tolerated anesthesia well. She is interested in having the stone removed. She has a lot of family in [place] for support.   10/28/2021: Patient here today for preoperative appointment prior to undergoing left PCNL to treat previously identified staghorn calculus in the left kidney on 7/25 with Dr. Claudia [name]. Patient remains on trimethoprim for bacterial suppression due to to recurrent UTI. She ran out of the medication about 2 days ago. She is not having any recurrence of UTI symptoms including absence of dysuria, gross hematuria. She does continue to have some midline and left-sided lower back pain/discomfort but this usually occurs with activity and position changes but resolves at rest. She denies any interval stone material passage. Patient denies any changes in past medical history, prescription medications taken on daily basis, no interval surgical or procedural intervention. She denies any recent fevers or chills, nausea/vomiting. She has had no recent chest pain, shortness of breath, lightheadedness or dizziness, numbness or tingling in the extremities.   12/04/2021: 81 year old woman with a history of left staghorn calculus who underwent left PCNL on 11/12/2021. She completed postoperative cephalexin and is resumed trimethoprim for bacterial suppression at night. She is doing well since surgery and not had any pain. She still has an indwelling stent on the left side. She is scheduled for left ureteroscopy next week for residual stone burden.     ALLERGIES: None   MEDICATIONS: Aspirin  Levothyroxine Sodium 200 mcg tablet  Amlodipine Besylate  Furosemide   Trimethoprim 100 mg tablet 1 tablet PO Q HS     GU PSH: Cystoscopy - 07/24/2021 Locm 300-'399Mg'$ /Ml Iodine,1Ml - 08/12/2021 Percut Stone Removal >2cm - 11/12/2021     NON-GU PSH: Knee replacement, Bilateral Removal of skin cancer Thyroid Surgery     GU PMH: Renal calculus - 10/28/2021, - 09/06/2021, - 08/19/2021 Urinary Tract Inf, Unspec site - 08/19/2021, - 07/24/2021 Microscopic hematuria - 08/12/2021, - 07/24/2021 Nocturia - 07/24/2021 Urge incontinence - 07/24/2021 Urinary Frequency - 07/24/2021    NON-GU PMH: Arthritis Cardiac murmur, unspecified DVT, History Hypertension Hypothyroidism Skin Cancer, History    FAMILY HISTORY: 1 Daughter - Daughter 1 son - Son Heart trouble - Mother Myocardial Infarction - Father   SOCIAL HISTORY: Marital Status: Divorced Preferred Language: English; Ethnicity: Not Hispanic Or Latino; Race: White Current Smoking Status: Patient has never smoked.   Tobacco Use Assessment Completed: Used Tobacco in last 30 days? Does not use smokeless tobacco. Has never drank.  Drinks 3 caffeinated drinks per day. 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    REVIEW OF SYSTEMS:    GU Review Female:   Patient denies frequent urination, hard to postpone urination, burning /pain with urination, get up at night to urinate, leakage of urine, stream starts and stops, trouble starting your stream, have to strain to urinate, and being pregnant.  Gastrointestinal (Upper):   Patient denies nausea, vomiting, and indigestion/ heartburn.  Gastrointestinal (Lower):   Patient denies diarrhea and constipation.  Constitutional:   Patient denies fever, night sweats, weight loss, and fatigue.  Skin:   Patient denies skin rash/ lesion and itching.  Eyes:   Patient denies blurred vision and double vision.  Ears/ Nose/ Throat:  Patient denies sore throat and sinus problems.  Hematologic/Lymphatic:   Patient denies swollen glands and easy bruising.  Cardiovascular:    Patient denies leg swelling and chest pains.  Respiratory:   Patient denies cough and shortness of breath.  Endocrine:   Patient denies excessive thirst.  Musculoskeletal:   Patient denies back pain and joint pain.  Neurological:   Patient denies headaches and dizziness.  Psychologic:   Patient denies depression and anxiety.   VITAL SIGNS:      12/04/2021 08:54 AM  BP 152/82 mmHg  Pulse 87 /min  Temperature 98.4 F / 36.8 C   MULTI-SYSTEM PHYSICAL EXAMINATION:    Constitutional: Well-nourished. No physical deformities. Normally developed. Good grooming.  Neck: Neck symmetrical, not swollen. Normal tracheal position.  Respiratory: No labored breathing, no use of accessory muscles.   Skin: No paleness, no jaundice, no cyanosis. No lesion, no ulcer, no rash.  Neurologic / Psychiatric: Oriented to time, oriented to place, oriented to person. No depression, no anxiety, no agitation.  Eyes: Normal conjunctivae. Normal eyelids.  Ears, Nose, Mouth, and Throat: Left ear no scars, no lesions, no masses. Right ear no scars, no lesions, no masses. Nose no scars, no lesions, no masses. Normal hearing. Normal lips.  Musculoskeletal: Normal gait and station of head and neck.     Complexity of Data:  Records Review:   Previous Patient Records, POC Tool  Urine Test Review:   Urinalysis  X-Ray Review: KUB: Reviewed Films. Discussed With Patient. Patient with significantly decreased stone burden in the left side however renal calculi still present    PROCEDURES:         KUB - 27062  A single view of the abdomen is obtained.      Patient confirmed No Neulasta OnPro Device.            Urinalysis w/Scope - 81001 Dipstick Dipstick Cont'd Micro  Color: Yellow Bilirubin: Neg WBC/hpf: 20 - 40/hpf  Appearance: Slightly Cloudy Ketones: Neg RBC/hpf: 20 - 40/hpf  Specific Gravity: 1.025 Blood: 3+ Bacteria: Many (>50/hpf)  pH: <=5.0 Protein: Neg Cystals: NS (Not Seen)  Glucose: Neg Urobilinogen: 0.2  Casts: NS (Not Seen)    Nitrites: Neg Trichomonas: Not Present    Leukocyte Esterase: 3+ Mucous: Not Present      Epithelial Cells: 0 - 5/hpf      Yeast: NS (Not Seen)      Sperm: Not Present    Notes:      ASSESSMENT:      ICD-10 Details  1 GU:   Renal calculus - N20.0 Chronic, Stable   PLAN:           Orders Labs CULTURE, URINE          Schedule X-Rays: Today 12/03/2021 - KUB          Document Letter(s):  Created for Patient: Clinical Summary         Notes:   Urolithiasis:  -Reviewed patient's KUB which does show stone burden significant of to warrant repeat ureteroscopy which is scheduled for next week  -Risks and benefits of the procedure were discussed with the patient in detail and she is like to proceed  -She will continue on nightly trimethoprim for prophylaxis

## 2021-12-24 ENCOUNTER — Encounter (HOSPITAL_COMMUNITY)
Admission: RE | Disposition: A | Discharge status: Home / Self Care | Payer: Self-pay | Source: Home / Self Care | Attending: Urology

## 2021-12-24 ENCOUNTER — Other Ambulatory Visit (HOSPITAL_COMMUNITY): Payer: Self-pay

## 2021-12-24 ENCOUNTER — Ambulatory Visit (HOSPITAL_COMMUNITY)
Admission: RE | Admit: 2021-12-24 | Discharge: 2021-12-24 | Disposition: A | Discharge status: Home / Self Care | Payer: Medicare Other | Attending: Urology | Admitting: Urology

## 2021-12-24 ENCOUNTER — Ambulatory Visit (HOSPITAL_BASED_OUTPATIENT_CLINIC_OR_DEPARTMENT_OTHER): Payer: Medicare Other | Admitting: Anesthesiology

## 2021-12-24 ENCOUNTER — Ambulatory Visit (HOSPITAL_COMMUNITY): Payer: Medicare Other

## 2021-12-24 ENCOUNTER — Ambulatory Visit (HOSPITAL_COMMUNITY): Payer: Medicare Other | Admitting: Physician Assistant

## 2021-12-24 ENCOUNTER — Encounter (HOSPITAL_COMMUNITY): Payer: Self-pay | Admitting: Urology

## 2021-12-24 DIAGNOSIS — N200 Calculus of kidney: Secondary | ICD-10-CM | POA: Insufficient documentation

## 2021-12-24 DIAGNOSIS — F419 Anxiety disorder, unspecified: Secondary | ICD-10-CM | POA: Diagnosis not present

## 2021-12-24 DIAGNOSIS — I10 Essential (primary) hypertension: Secondary | ICD-10-CM | POA: Insufficient documentation

## 2021-12-24 DIAGNOSIS — Z87440 Personal history of urinary (tract) infections: Secondary | ICD-10-CM | POA: Diagnosis not present

## 2021-12-24 DIAGNOSIS — M1990 Unspecified osteoarthritis, unspecified site: Secondary | ICD-10-CM

## 2021-12-24 DIAGNOSIS — Z6841 Body Mass Index (BMI) 40.0 and over, adult: Secondary | ICD-10-CM | POA: Diagnosis not present

## 2021-12-24 DIAGNOSIS — E039 Hypothyroidism, unspecified: Secondary | ICD-10-CM | POA: Diagnosis not present

## 2021-12-24 DIAGNOSIS — Z79899 Other long term (current) drug therapy: Secondary | ICD-10-CM | POA: Diagnosis not present

## 2021-12-24 DIAGNOSIS — Z01818 Encounter for other preprocedural examination: Secondary | ICD-10-CM

## 2021-12-24 HISTORY — PX: CYSTOSCOPY WITH RETROGRADE PYELOGRAM, URETEROSCOPY AND STENT PLACEMENT: SHX5789

## 2021-12-24 HISTORY — PX: HOLMIUM LASER APPLICATION: SHX5852

## 2021-12-24 SURGERY — CYSTOURETEROSCOPY, WITH RETROGRADE PYELOGRAM AND STENT INSERTION
Anesthesia: General | Laterality: Left

## 2021-12-24 MED ORDER — PROPOFOL 10 MG/ML IV BOLUS: INTRAVENOUS | Status: DC | PRN

## 2021-12-24 MED ORDER — CEFAZOLIN SODIUM-DEXTROSE 2-4 GM/100ML-% IV SOLN
INTRAVENOUS | Status: AC
  Filled 2021-12-24: qty 100

## 2021-12-24 MED ORDER — PROPOFOL 10 MG/ML IV BOLUS
INTRAVENOUS | Status: DC | PRN
  Administered 2021-12-24: 160 mg via INTRAVENOUS

## 2021-12-24 MED ORDER — ONDANSETRON HCL 4 MG/2ML IJ SOLN
INTRAMUSCULAR | Status: DC | PRN
  Administered 2021-12-24: 4 mg via INTRAVENOUS

## 2021-12-24 MED ORDER — FENTANYL CITRATE (PF) 100 MCG/2ML IJ SOLN
INTRAMUSCULAR | Status: AC
  Filled 2021-12-24: qty 2

## 2021-12-24 MED ORDER — AMISULPRIDE (ANTIEMETIC) 5 MG/2ML IV SOLN: 10.0000 mg | Freq: Once | INTRAVENOUS | Status: DC | PRN

## 2021-12-24 MED ORDER — DEXAMETHASONE SODIUM PHOSPHATE 10 MG/ML IJ SOLN
INTRAMUSCULAR | Status: DC | PRN
  Administered 2021-12-24: 10 mg via INTRAVENOUS

## 2021-12-24 MED ORDER — ACETAMINOPHEN 500 MG PO TABS
1000.0000 mg | ORAL_TABLET | Freq: Once | ORAL | Status: AC
  Administered 2021-12-24: 1000 mg via ORAL
  Filled 2021-12-24: qty 2

## 2021-12-24 MED ORDER — TRAMADOL HCL 50 MG PO TABS
50.0000 mg | ORAL_TABLET | Freq: Four times a day (QID) | ORAL | 0 refills | Status: DC | PRN
  Filled 2021-12-24: qty 20, 5d supply, fill #0

## 2021-12-24 MED ORDER — FENTANYL CITRATE (PF) 100 MCG/2ML IJ SOLN
INTRAMUSCULAR | Status: DC | PRN
  Administered 2021-12-24: 50 ug via INTRAVENOUS
  Administered 2021-12-24 (×2): 25 ug via INTRAVENOUS

## 2021-12-24 MED ORDER — EPHEDRINE SULFATE (PRESSORS) 50 MG/ML IJ SOLN
INTRAMUSCULAR | Status: DC | PRN
  Administered 2021-12-24: 5 mg via INTRAVENOUS

## 2021-12-24 MED ORDER — FENTANYL CITRATE PF 50 MCG/ML IJ SOSY: 25.0000 ug | PREFILLED_SYRINGE | INTRAMUSCULAR | Status: DC | PRN

## 2021-12-24 MED ORDER — ONDANSETRON HCL 4 MG/2ML IJ SOLN
INTRAMUSCULAR | Status: AC
  Filled 2021-12-24: qty 2

## 2021-12-24 MED ORDER — SODIUM CHLORIDE 0.9 % IR SOLN
Status: DC | PRN
  Administered 2021-12-24: 3000 mL

## 2021-12-24 MED ORDER — ORAL CARE MOUTH RINSE: 15.0000 mL | Freq: Once | OROMUCOSAL | Status: AC

## 2021-12-24 MED ORDER — CEFAZOLIN SODIUM-DEXTROSE 2-4 GM/100ML-% IV SOLN
2.0000 g | Freq: Once | INTRAVENOUS | Status: AC
  Administered 2021-12-24: 2 g via INTRAVENOUS

## 2021-12-24 MED ORDER — LIDOCAINE HCL (CARDIAC) PF 100 MG/5ML IV SOSY
PREFILLED_SYRINGE | INTRAVENOUS | Status: DC | PRN
  Administered 2021-12-24: 100 mg via INTRATRACHEAL

## 2021-12-24 MED ORDER — LACTATED RINGERS IV SOLN: INTRAVENOUS | Status: DC

## 2021-12-24 MED ORDER — EPHEDRINE 5 MG/ML INJ
INTRAVENOUS | Status: AC
Start: 2021-12-24 — End: ?
  Filled 2021-12-24: qty 5

## 2021-12-24 MED ORDER — PROPOFOL 10 MG/ML IV BOLUS
INTRAVENOUS | Status: AC
  Filled 2021-12-24: qty 20

## 2021-12-24 MED ORDER — CHLORHEXIDINE GLUCONATE 0.12 % MT SOLN
15.0000 mL | Freq: Once | OROMUCOSAL | Status: AC
  Administered 2021-12-24: 15 mL via OROMUCOSAL

## 2021-12-24 SURGICAL SUPPLY — 21 items
BAG URO CATCHER STRL LF (MISCELLANEOUS) ×1 IMPLANT
BASKET ZERO TIP NITINOL 2.4FR (BASKET) IMPLANT
CATH URETL OPEN 5X70 (CATHETERS) ×1 IMPLANT
CLOTH BEACON ORANGE TIMEOUT ST (SAFETY) ×1 IMPLANT
DRSG TEGADERM 2-3/8X2-3/4 SM (GAUZE/BANDAGES/DRESSINGS) IMPLANT
EXTRACTOR STONE 1.7FRX115CM (UROLOGICAL SUPPLIES) IMPLANT
GLOVE BIO SURGEON STRL SZ 6.5 (GLOVE) ×1 IMPLANT
GOWN STRL REUS W/ TWL LRG LVL3 (GOWN DISPOSABLE) ×1 IMPLANT
GOWN STRL REUS W/TWL LRG LVL3 (GOWN DISPOSABLE) ×1
GUIDEWIRE STR DUAL SENSOR (WIRE) ×1 IMPLANT
KIT TURNOVER KIT A (KITS) IMPLANT
LASER FIB FLEXIVA PULSE ID 365 (Laser) IMPLANT
MANIFOLD NEPTUNE II (INSTRUMENTS) ×1 IMPLANT
PACK CYSTO (CUSTOM PROCEDURE TRAY) ×1 IMPLANT
SHEATH NAVIGATOR HD 11/13X28 (SHEATH) IMPLANT
SHEATH NAVIGATOR HD 11/13X36 (SHEATH) IMPLANT
STENT URET 6FRX24 CONTOUR (STENTS) IMPLANT
TRACTIP FLEXIVA PULS ID 200XHI (Laser) IMPLANT
TRACTIP FLEXIVA PULSE ID 200 (Laser) IMPLANT
TUBING CONNECTING 10 (TUBING) ×1 IMPLANT
TUBING UROLOGY SET (TUBING) ×1 IMPLANT

## 2021-12-24 NOTE — Interval H&P Note (Signed)
History and Physical Interval Note:  12/24/2021 10:42 AM  [place]  has presented today for surgery, with the diagnosis of LEFT RENAL CALCULUS.  The various methods of treatment have been discussed with the patient and family. After consideration of risks, benefits and other options for treatment, the patient has consented to  Procedure(s): CYSTOSCOPY WITH RETROGRADE PYELOGRAM, URETEROSCOPY AND STENT REPLACEMENT (Left) HOLMIUM LASER APPLICATION (Left) as a surgical intervention.  The patient's history has been reviewed, patient examined, no change in status, stable for surgery.  I have reviewed the patient's chart and labs.  Questions were answered to the patient's satisfaction.     Mauri Tolen D Singleton Hickox

## 2021-12-24 NOTE — Op Note (Signed)
Operative Note  Preoperative diagnosis:  1.  Left renal calculus  Postoperative diagnosis: 1.  Left renal calculus  Procedure(s): 1.  Cystoscopy, left ureteroscopy, laser lithotripsy, left ureteral stent exchange  Surgeon: Jacalyn Lefevre, MD  Assistants:  None  Anesthesia:  General  Complications:  None  EBL:  minimal  Specimens: 1. none  Drains/Catheters: 1.  6Fr x 24cm left JJ stent - no tether  Intraoperative findings:   Normal urethra   Indication:  Cassidy [name] is a 81 y.o. female with 81 year old woman with a history of left staghorn calculus who underwent left PCNL on 11/12/2021. She completed postoperative cephalexin and is resumed trimethoprim for bacterial suppression at night. She is doing well since surgery and not had any pain. She still has an indwelling stent on the left side.   Description of procedure:  After risks and benefits of the procedure discussed with the patient, informed consent was obtained.  The patient is taken to the operating room placed in the supine position.  Anesthesia was induced and antibiotics were administered.  The patient was then repositioned in the dorsolithotomy position.  She was prepped and draped in usual sterile fashion.  A timeout was performed.  A 21 French rigid cystoscope was placed in the urethral meatus and advanced in the bladder and direct visualization.  The existing left ureteral stent was grasped with a graspers and brought to the urethral meatus.  A 0.38 sensor wire was then placed through the ureteral stent and up to the kidney and fluoroscopic guidance.  The stent was removed in its entirety.  A second sensor wire was placed alongside the first and advanced the kidney with fluoroscopic guidance.  The cystoscope was removed.  A ureteral access sheath was then placed over the second wire using fluoroscopic guidance.  The inner sheath and wire removed.  Flexible ureteroscopy then took place.  2 larger stones were  seen in the midpole.  A 200 m holmium laser fiber was then used to dust the stones.  Basket stone extraction was used to remove the larger fragments however all fragments were deemed to millimeter less than of the case.  The ureteroscope was removed and unison with the ureteral access sheath taking care to examine the ureter on the way out.  There is no remaining stone fragments or trauma seen to the ureter.  The cystoscope was then replaced and a 6 [place] by 24 cm JJ stent was placed over the existing wire and up to the kidney with fluoroscopic guidance.  A proximal curl was seen with fluoroscopy and distal curl seen with direct visualization.  No tether was left on the stent.  The patient's bladder was decompressed and the scope was removed.  The patient emerged from anesthesia and transferred the PACU in stable condition.  Plan:  Plan for stent removal in 2 weeks

## 2021-12-24 NOTE — Anesthesia Preprocedure Evaluation (Addendum)
Anesthesia Evaluation  Patient identified by MRN, date of birth, ID band Patient awake    Reviewed: Allergy & Precautions, NPO status , Patient's Chart, lab work & pertinent test results  History of Anesthesia Complications (+) PONV, DIFFICULT AIRWAY and history of anesthetic complications (usually just some nausea postop, has gotten better as she has aged)  Airway Mallampati: III  TM Distance: >3 FB Neck ROM: Full    Dental  (+) Teeth Intact, Dental Advisory Given   Pulmonary shortness of breath, PE (2010)   breath sounds clear to auscultation       Cardiovascular hypertension, Pt. on medications Normal cardiovascular exam+ Valvular Problems/Murmurs  Rhythm:Regular Rate:Normal  Stress test 2023 .  The study is normal. The study is low risk. .  No ST deviation was noted. .  LV perfusion is normal. There is no evidence of ischemia. There is no evidence of infarction. .  Left ventricular function is normal. Nuclear stress EF: 62 %. The left ventricular ejection fraction is normal (55-65%). End diastolic cavity size is normal. End systolic cavity size is normal. .  Prior study not available for comparison.    Neuro/Psych  Headaches, PSYCHIATRIC DISORDERS Anxiety    GI/Hepatic negative GI ROS, Neg liver ROS,   Endo/Other  Hypothyroidism Morbid obesityBMI 37  Renal/GU Renal diseaseL renal calculus, Cr 1.03     Musculoskeletal  (+) Arthritis , Osteoarthritis,    Abdominal (+) + obese,   Peds  Hematology negative hematology ROS (+) Hb 14, plt 230   Anesthesia Other Findings   Reproductive/Obstetrics negative OB ROS                            Anesthesia Physical  Anesthesia Plan  ASA: 3  Anesthesia Plan: General   Post-op Pain Management: Tylenol PO (pre-op)* and Minimal or no pain anticipated   Induction: Intravenous  PONV Risk Score and Plan: 4 or greater and Ondansetron, Dexamethasone  and Treatment may vary due to age or medical condition  Airway Management Planned: Oral ETT and LMA  Additional Equipment: None  Intra-op Plan:   Post-operative Plan: Extubation in OR  Informed Consent: I have reviewed the patients History and Physical, chart, labs and discussed the procedure including the risks, benefits and alternatives for the proposed anesthesia with the patient or authorized representative who has indicated his/her understanding and acceptance.     Dental advisory given  Plan Discussed with: CRNA  Anesthesia Plan Comments:        Anesthesia Quick Evaluation

## 2021-12-24 NOTE — Transfer of Care (Signed)
Immediate Anesthesia Transfer of Care Note  Patient: Cassidy [name]  Procedure(s) Performed: CYSTOSCOPY WITH RETROGRADE PYELOGRAM, URETEROSCOPY AND STENT REPLACEMENT (Left) HOLMIUM LASER APPLICATION (Left)  Patient Location: PACU  Anesthesia Type:General  Level of Consciousness: drowsy, patient cooperative and responds to stimulation  Airway & Oxygen Therapy: Patient Spontanous Breathing and Patient connected to face mask oxygen  Post-op Assessment: Report given to RN and Post -op Vital signs reviewed and stable  Post vital signs: Reviewed and stable  Last Vitals:  Vitals Value Taken Time  BP 130/79 12/24/21 1405  Temp    Pulse 77 12/24/21 1406  Resp 16 12/24/21 1406  SpO2 100 % 12/24/21 1406  Vitals shown include unvalidated device data.  Last Pain:  Vitals:   12/24/21 1046  TempSrc:   PainSc: 0-No pain         Complications: No notable events documented.

## 2021-12-24 NOTE — Discharge Instructions (Addendum)
DISCHARGE INSTRUCTIONS FOR KIDNEY STONE/URETERAL STENT   MEDICATIONS:  1. Resume all your other meds from home  2. AZO over the counter can help with the burning/stinging when you urinate. 3. Tramadol can be used for severe pain.   ACTIVITY:  1. No strenuous activity x 1week  2. No driving while on narcotic pain medications  3. Drink plenty of water  4. Continue to walk at home - you can still get blood clots when you are at home, so keep active, but don't over do it.  5. May return to work/school tomorrow or when you feel ready   BATHING:  1. You can shower and we recommend daily showers    SIGNS/SYMPTOMS TO CALL:  Please call us if you have a fever greater than 101.5, uncontrolled nausea/vomiting, uncontrolled pain, dizziness, unable to urinate, bloody urine, chest pain, shortness of breath, leg swelling, leg pain, redness around wound, drainage from wound, or any other concerns or questions.   You can reach [place] at [phone].   FOLLOW-UP:  1. Your stent will be removed in the office at your follow up appointment.

## 2021-12-24 NOTE — Progress Notes (Signed)
Spoke with Dr.Pace via phone, informed her no orders in epic, no antibiotic ordered, offered to take verbal for antibiotic over phone, Dr.Pace states she will look over the patients chart and decide what needs to be ordered as she is onsite.

## 2021-12-24 NOTE — Anesthesia Procedure Notes (Signed)
Procedure Name: LMA Insertion Date/Time: 12/24/2021 1:11 PM  Performed by: Glory Buff, CRNAPre-anesthesia Checklist: Patient identified, Emergency Drugs available, Suction available and Patient being monitored Patient Re-evaluated:Patient Re-evaluated prior to induction Oxygen Delivery Method: Circle system utilized Preoxygenation: Pre-oxygenation with 100% oxygen Induction Type: IV induction LMA: LMA inserted LMA Size: 4.0 Number of attempts: 1 Placement Confirmation: positive ETCO2 Tube secured with: Tape Dental Injury: Teeth and Oropharynx as per pre-operative assessment

## 2021-12-25 ENCOUNTER — Encounter (HOSPITAL_COMMUNITY): Payer: Self-pay | Admitting: Urology

## 2021-12-25 NOTE — Anesthesia Postprocedure Evaluation (Signed)
Anesthesia Post Note  Patient: Cassidy [name]  Procedure(s) Performed: CYSTOSCOPY WITH RETROGRADE PYELOGRAM, URETEROSCOPY AND STENT REPLACEMENT (Left) HOLMIUM LASER APPLICATION (Left)     Patient location during evaluation: PACU Anesthesia Type: General Level of consciousness: sedated and patient cooperative Pain management: pain level controlled Vital Signs Assessment: post-procedure vital signs reviewed and stable Respiratory status: spontaneous breathing Cardiovascular status: stable Anesthetic complications: no   No notable events documented.  Last Vitals:  Vitals:   12/24/21 1430 12/24/21 1455  BP: 115/68 (!) 142/80  Pulse: 73   Resp: 13 14  Temp: 36.5 C   SpO2: 98% 97%    Last Pain:  Vitals:   12/24/21 1525  TempSrc:   PainSc: 0-No pain                 Nolon Nations

## 2021-12-26 ENCOUNTER — Other Ambulatory Visit (HOSPITAL_COMMUNITY): Payer: Self-pay

## 2021-12-31 ENCOUNTER — Other Ambulatory Visit (HOSPITAL_COMMUNITY): Payer: Self-pay

## 2022-01-07 DIAGNOSIS — N200 Calculus of kidney: Secondary | ICD-10-CM | POA: Diagnosis not present

## 2022-01-13 DIAGNOSIS — M79671 Pain in right foot: Secondary | ICD-10-CM | POA: Diagnosis not present

## 2022-01-19 NOTE — Progress Notes (Unsigned)
Name: Cassidy [name]  MRN/ DOB: [id number], Jun 18, 1940    Age/ Sex: 81 y.o., female     PCP: Elby Showers, MD   Reason for Endocrinology Evaluation: [facility]     Initial Endocrinology Clinic Visit: 04/05/2020    PATIENT IDENTIFIER: [name] is a 81 y.o., female with a past medical history of HTN, Hx of DVT's and melanoma . She has followed with [facility] since 04/05/2020  for consultative assistance with management of her MNG.      HISTORICAL SUMMARY:  Pt was noted to have a tracheal deviation on CXR during pre-op evaluation. A CT scan showed a left thyroid necrotic mass that measures ~ 6.5 cm. 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   Afirma came back benign for the 2.6 cm nodule but no afirma results  for the 5.9 nodule due to low follicular content.  She is S/P total thyroidectomy on 06/17/2019 with a pathology report of 6.0 cm PTC   Of note she has been on LT- 4 replacement for years   A Thyrogen WBS 09/2019 showed Focal activity in thyroid bed consistent with remnant thyroid tissue and potential metastatic lymph node. Follow up thyroid bed ultrasound was unrevealing.    She is S/P RAI 53.5 mCi I-131  On 11/30/2019  Post-Op WBS scan demonstrated a Focus of increased tracer uptake at LEFT thyroid bed consistent with thyroid remnant and a  less intense focus of uptake cranial to the more intense focus at the LEFT thyroid bed, question additional thyroid remnant versus regional metastatic lymph node.    SUBJECTIVE:    Today (01/20/2022):  Ms. [name] is here for a follow up on total thyroidectomy and a PTC diagnosis.   She is s/p cystoscopy for left renal calculus  12/2021 She continues with weight loss Has a right foot boot for a stress fracture , follows with [facility] Denies palpitations  Denies  loose stools and diarrhea  No local neck symptoms   Levothyroxine 200 mcg, TWO tabs on sundays and 1 tablet the rest of the week     HISTORY:  Past Medical History:  Past Medical History:  Diagnosis Date   Cancer ([place])    right foot,top of arm, lympth nodes removed   Dependent edema    DVT (deep venous thrombosis) ([place])    Fatty liver    Heart murmur    occ   High risk surgery, pre-operative cardiovascular examination 11/06/2021   History of kidney stones    HTN (hypertension), benign    Hyperlipidemia    Hypothyroidism    Lymphedema    bi lat leggs   Melanoma ([place])    Migraine 08/27/2010   Osteopenia    PE (pulmonary embolism) 2010   bilateral hx post op knee replacement   PONV (postoperative nausea and vomiting)    Pure hypercholesterolemia 11/06/2021   Shortness of breath 11/06/2021   on exertion   Past Surgical History:  Past Surgical History:  Procedure Laterality Date   ABDOMINAL HYSTERECTOMY     age 24   AMPUTATION  11/10/2011   Procedure: AMPUTATION RAY;  Surgeon: Kerin Salen, MD;  Location: [place];  Service: Orthopedics;  Laterality: Left;  LEFT 2ND  TOE TRANS MIDDLE PHALANX AMPUTATION    ANKLE FUSION  2005   lt-fusion   CHOLECYSTECTOMY  02/14/2002   COLONOSCOPY     CYSTOSCOPY WITH RETROGRADE PYELOGRAM, URETEROSCOPY AND STENT PLACEMENT Left 12/24/2021   Procedure: CYSTOSCOPY WITH RETROGRADE PYELOGRAM, URETEROSCOPY AND STENT REPLACEMENT;  Surgeon: Robley Fries, MD;  [facility];  Service: Urology;  Laterality: Left;   GANGLION CYST EXCISION  2008   Rt wrist   HOLMIUM LASER APPLICATION Left 5/0/9326   Procedure: HOLMIUM LASER APPLICATION;  Surgeon: Robley Fries, MD;  [facility];  Service: Urology;  Laterality: Left;   IR NEPHROSTOGRAM LEFT THRU EXISTING ACCESS  11/14/2021   IR  URETERAL STENT PLACEMENT EXISTING ACCESS LEFT  11/12/2021   JOINT REPLACEMENT  2010   rt total knee   KNEE ARTHROSCOPY Right 09/2001   Rt knee   NEPHROLITHOTOMY Left 11/12/2021   Procedure: NEPHROLITHOTOMY PERCUTANEOUS;  Surgeon: Robley Fries, MD;  [facility];  Service: Urology;  Laterality: Left;  3 HRS   OVARIAN CYST SURGERY Left    age 20 and appendectomy   RECTOCELE REPAIR  2009   REDUCTION MAMMAPLASTY Bilateral    THYROIDECTOMY N/A 06/17/2019   Procedure: TOTAL THYROIDECTOMY;  Surgeon: Armandina Gemma, MD;  [facility];  Service: General;  Laterality: N/A;   TOTAL KNEE ARTHROPLASTY Left 12/06/2018   Procedure: Left Knee Arthroplasty;  Surgeon: Frederik Pear, MD;  [facility];  Service: Orthopedics;  Laterality: Left;   UPPER GI ENDOSCOPY     Social History:  reports that she has never smoked. She has never used smokeless tobacco. She reports that she does not drink alcohol and does not use drugs. Family History:  Family History  Problem Relation Age of Onset   Heart disease Mother    Heart attack Father 10   Heart disease Father    Heart attack Brother    Diabetes Brother    Heart disease Brother    Breast cancer Daughter 31     HOME MEDICATIONS: Allergies as of 01/20/2022   No Known Allergies      Medication List        Accurate as of January 20, 2022  7:28 AM. If you have any questions, ask your nurse or doctor.          acetaminophen 325 MG tablet Commonly known as: TYLENOL Take 650 mg by mouth every 6 (six) hours as needed for moderate pain or headache.   ALPRAZolam 0.25 MG tablet Commonly known as: XANAX Take 1 tablet (0.25 mg total) by mouth at bedtime as needed for anxiety.   amLODipine 5 MG tablet Commonly known as: NORVASC TAKE ONE TABLET DAILY   furosemide 40 MG tablet Commonly known as: LASIX TAKE ONE TABLET DAILY   levothyroxine 200 MCG tablet Commonly known as: SYNTHROID Take 1 tablet (200 mcg total) by mouth as directed.  1.5 tablets on Sunday, 1 tablet daily Monday through Saturday   promethazine 25 MG tablet Commonly known as: PHENERGAN Take 1 tablet (25 mg total) by mouth every 8 (eight) hours as needed for nausea or vomiting.   traMADol 50 MG tablet Commonly known as: Ultram Take 1 tablet (50 mg total) by mouth every 6 (six) hours as needed.   traMADol 50 MG tablet Commonly known as: Ultram Take 1 tablet (50 mg total) by mouth every 6 (six) hours as needed.   trimethoprim 100 MG tablet Commonly known as: TRIMPEX Take 100 mg by mouth at bedtime.  OBJECTIVE:   PHYSICAL EXAM: VS: There were no vitals taken for this visit.    EXAM: General: Pt appears well and is in NAD  Neck: General: Supple without adenopathy. Thyroid: No nodules appreciated  Lungs: Clear with good BS bilat with no rales, rhonchi, or wheezes  Heart: Auscultation: RRR.  Mental Status: Judgment, insight: Intact Orientation: Oriented to time, place, and person Mood and affect: No depression, anxiety, or agitation     DATA REVIEWED:  Latest Reference Range & Units 01/20/22 08:38  TSH 0.35 - 5.50 uIU/mL 2.52    Latest Reference Range & Units 12/16/21 08:30  Sodium 135 - 145 mmol/L 140  Potassium 3.5 - 5.1 mmol/L 4.5  Chloride 98 - 111 mmol/L 107  CO2 22 - 32 mmol/L 26  Glucose 70 - 99 mg/dL 107 (H)  BUN 8 - 23 mg/dL 20  Creatinine 0.44 - 1.00 mg/dL 1.00  Calcium 8.9 - 10.3 mg/dL 8.9  Anion gap 5 - 15  7  GFR, Estimated >60 mL/min 57 (L)    Pathology report 06/17/2019 Procedure: Total thyroidectomy  Tumor Focality: Unifocal  Tumor Site: Left thyroid lobe  Tumor Size: 6.0 cm  Histologic Type: Papillary thyroid carcinoma, follicular variant  Margins: Uninvolved by tumor  Angioinvasion: Not identified  Lymphatic Invasion: Not identified  Extrathyroidal extension: Not identified  Regional Lymph Nodes: No lymph nodes submitted or found  Pathologic Stage Classification (pTNM, AJCC 8th Edition): pT3a,  pNx  field    WBS 12/09/19  Focus of increased tracer uptake at LEFT thyroid bed consistent with thyroid remnant.   Additional less intense focus of uptake cranial to the more intense focus at the LEFT thyroid bed, question additional thyroid remnant versus regional metastatic lymph node.    Thyroid bed ultrasound 07/03/2020  IMPRESSION: No residual/recurrent tissue post thyroidectomy.  ASSESSMENT / PLAN / RECOMMENDATIONS:   Papillary thyroid carcinoma ( pT3a, pNx) , Stage II  - S/P RAI ablation 11/2019  -Patient with excellent biochemical and structural response to therapy - TSH goal for  is ~ 0.5-2.0  uIU/mL given advanced age and increased risk of cardiac arrhythmia with low TSH levels - Tg pending and Tg Ab continues to trend down -No changes today -We will proceed with thyroid bed ultrasound   2. Post-Operative Hypothyroidism:   - Pt is clinically euthyroid  -No change in levothyroxine dose Medications   New levothyroxine 200 mcg ,2 tablets on  Sunday and 1 tablet rest of the week      F/u in 6 months     Signed electronically by: Mack Guise, MD  [facility]  [address] [place], [place] [address] Phone: [phone] FAX: [phone]      CC: Elby Showers, MD [address] [place] [address] Phone: [phone]  Fax: [phone]   Return to Endocrinology clinic as below: Future Appointments  Date Time Provider [place]  01/20/2022  7:50 AM Najla Aughenbaugh, Melanie Crazier, MD [facility] None  02/03/2022  9:45 AM Trula Slade, DPM [facility]  02/10/2022  9:20 AM Skeet Latch, MD [facility]  05/20/2022  9:00 AM [facility] MJB  05/27/2022 10:00 AM Baxley, Cresenciano Lick, MD [facility]

## 2022-01-20 ENCOUNTER — Encounter: Payer: Self-pay | Admitting: Internal Medicine

## 2022-01-20 ENCOUNTER — Ambulatory Visit (INDEPENDENT_AMBULATORY_CARE_PROVIDER_SITE_OTHER): Payer: Medicare Other | Admitting: Internal Medicine

## 2022-01-20 VITALS — BP 130/70 | HR 74 | Ht 65.0 in | Wt 221.0 lb

## 2022-01-20 DIAGNOSIS — E890 Postprocedural hypothyroidism: Secondary | ICD-10-CM | POA: Diagnosis not present

## 2022-01-20 DIAGNOSIS — C73 Malignant neoplasm of thyroid gland: Secondary | ICD-10-CM

## 2022-01-20 LAB — TSH: TSH: 2.52 u[IU]/mL (ref 0.35–5.50)

## 2022-01-20 MED ORDER — LEVOTHYROXINE SODIUM 200 MCG PO TABS: 200.0000 ug | ORAL_TABLET | ORAL | 3 refills | Status: DC

## 2022-01-20 NOTE — Patient Instructions (Signed)

## 2022-01-24 ENCOUNTER — Ambulatory Visit
Admission: RE | Admit: 2022-01-24 | Discharge: 2022-01-24 | Disposition: A | Discharge status: Home / Self Care | Payer: Medicare Other | Source: Ambulatory Visit | Attending: Internal Medicine | Admitting: Internal Medicine

## 2022-01-24 DIAGNOSIS — C73 Malignant neoplasm of thyroid gland: Secondary | ICD-10-CM | POA: Diagnosis not present

## 2022-01-24 DIAGNOSIS — E890 Postprocedural hypothyroidism: Secondary | ICD-10-CM | POA: Diagnosis not present

## 2022-01-28 LAB — THYROGLOBULIN BY LCMS: Thyroglobulin by LCMS: <0.2 ng/mL — ABNORMAL LOW (ref 1.5–38.5)

## 2022-01-28 LAB — TGAB+THYROGLOBULIN IMA OR LCMS: Thyroglobulin Antibody: 1.3 IU/mL — ABNORMAL HIGH (ref 0.0–0.9)

## 2022-02-03 ENCOUNTER — Ambulatory Visit (INDEPENDENT_AMBULATORY_CARE_PROVIDER_SITE_OTHER): Payer: Medicare Other | Admitting: Podiatry

## 2022-02-03 DIAGNOSIS — Z7901 Long term (current) use of anticoagulants: Secondary | ICD-10-CM | POA: Diagnosis not present

## 2022-02-03 DIAGNOSIS — M79676 Pain in unspecified toe(s): Secondary | ICD-10-CM

## 2022-02-03 DIAGNOSIS — B351 Tinea unguium: Secondary | ICD-10-CM | POA: Diagnosis not present

## 2022-02-03 DIAGNOSIS — Q828 Other specified congenital malformations of skin: Secondary | ICD-10-CM | POA: Diagnosis not present

## 2022-02-05 NOTE — Progress Notes (Signed)
Patient ID: Cassidy [name], female   DOB: [birth year], 82 y.o.   MRN: [id number]  Subjective: Chief Complaint  Patient presents with   Nail Problem    Routine  foot care, nail and callus trim    Cassidy [name], 81 year old female presents the office today for concerns of thick painful toenails and calluses to both of her feet as well as calluses.  No open lesions.  He did have a stress fracture in her right foot that was treated by orthopedics.  Those symptoms have improved.  Started after increased walking.  No new concerns.   Objective: AAO 3, NAD DP/PT pulses are palpable, CRT less than 3 seconds  Chronic edema present bilaterally Protective sensation absent with Derrel Nip monofilament Thick hyperkeratotic tissue plantar aspect right hallux with dried blood.  There is no underlying ulceration.  Appears to be stable.  Hyperkeratotic lesion also noted submetatarsal left foot as well as right hallux.  No underlying ulceration. Nails are hypertrophic, dystrophic, brittle, discolored, elongated 9. No surrounding redness or drainage. Tenderness nails 1-5 bilaterally except for left second toe which has been amputated. No area pinpoint tenderness today. No pain with calf compression, swelling, warmth, erythema.  Assessment: 81 year old female with pre-ulcerative calluses bilaterally, symptomatic onychomycosis  Plan: -Treatment options discussed including all alternatives, risks, and complications -Nail sharply debrided 9 without any complications or bleeding -Hyperkeratotic lesion sharply debrided 4 without complications. Monitor for any skin breakdown. Continue offloading. Moisturizer  -Continue compression, elevation. -We will defer further treatment for the stress fracture to orthopedics as they have been treating this. -Discussed the importance of daily foot inspection -Follow-up as scheduled or sooner if any problems arise. In the meantime, encouraged to call the office with any  questions, concerns, change in symptoms.   Celesta Gentile, DPM

## 2022-02-10 ENCOUNTER — Ambulatory Visit (INDEPENDENT_AMBULATORY_CARE_PROVIDER_SITE_OTHER): Payer: Medicare Other | Admitting: Cardiovascular Disease

## 2022-02-10 ENCOUNTER — Encounter (HOSPITAL_BASED_OUTPATIENT_CLINIC_OR_DEPARTMENT_OTHER): Payer: Self-pay | Admitting: Cardiovascular Disease

## 2022-02-10 VITALS — BP 138/88 | HR 90 | Ht 65.0 in | Wt 218.8 lb

## 2022-02-10 DIAGNOSIS — I700 Atherosclerosis of aorta: Secondary | ICD-10-CM

## 2022-02-10 DIAGNOSIS — I10 Essential (primary) hypertension: Secondary | ICD-10-CM | POA: Diagnosis not present

## 2022-02-10 DIAGNOSIS — E7800 Pure hypercholesterolemia, unspecified: Secondary | ICD-10-CM

## 2022-02-10 HISTORY — DX: Atherosclerosis of aorta: I70.0

## 2022-02-10 NOTE — Progress Notes (Signed)
Cardiology Office Note   Date:  02/11/2022   ID:  Cassidy [name], DOB 08-Jul-1940, MRN [id number]  PCP:  Elby Showers, MD  Cardiologist:   Skeet Latch, MD   No chief complaint on file.   History of Present Illness: Cassidy [name] is a 81 y.o. female with obesity, hypertension, pre-diabetes, papillary thyroid cancer status post thyroidectomy, prior pulmonary embolism, hyperlipidemia, and remote melanoma here for follow-up.  She was first seen 10/2021  for the evaluation of preoperative risk assessment.  She has had recurrent urinary tract infections due to staghorn calculi.  She saw Dr. Renold Genta for preoperative risk assessment.  EKG 09/2021 revealed sinus rhythm with LAFB.  She was scheduled to have percutaneous ephrolithotomy with general anesthesia on 11/12/2021.  They have requested that she hold aspirin for 5 days preoperatively.  At that visit she noted that she was not very physically active and got short of breath with walking short distances.  She was referred for [facility] 10/2021 which revealed LVEF 62% and no ischemia.  Today, patient is doing very well from a cardiac perspective. She denies any chest pain, chest pressure, or shortness of breath. She reports that her diet has been well balanced. She reports losing 8lbs with diet and exercise. She reports that her blood pressure is typically around 133/83 at home.  She had a left kidney stone removal and stent placement on 12/24/21 with Dr. Claudia Desanctis. She is very happy with her results. She is staying active but is having some foot pain issues. She is following with ortho surg Dr. Lucia Gaskins for her foot problems. She is considering starting PT for gait training as she has chronic balance problems.  Past Medical History:  Diagnosis Date   Aortic atherosclerosis ([place]) 02/10/2022   Cancer ([place])    right foot,top of arm, lympth nodes removed   Degenerative arthritis of left knee    Dependent edema    DVT (deep venous thrombosis)  ([facility])    Fatty liver    Heart murmur    occ   High risk surgery, pre-operative cardiovascular examination 11/06/2021   History of kidney stones    HTN (hypertension), benign    Hyperlipidemia    Hypothyroidism    Lymphedema    bi lat leggs   Melanoma ([place])    Migraine 08/27/2010   Osteopenia    PE (pulmonary embolism) 2010   bilateral hx post op knee replacement   PONV (postoperative nausea and vomiting)    Pure hypercholesterolemia 11/06/2021   Shortness of breath 11/06/2021   on exertion    Past Surgical History:  Procedure Laterality Date   ABDOMINAL HYSTERECTOMY     age 68   AMPUTATION  11/10/2011   Procedure: AMPUTATION RAY;  Surgeon: Kerin Salen, MD;  Location: [facility];  Service: Orthopedics;  Laterality: Left;  LEFT 2ND TOE TRANS MIDDLE PHALANX AMPUTATION    ANKLE FUSION  2005   lt-fusion   CHOLECYSTECTOMY  02/14/2002   COLONOSCOPY     CYSTOSCOPY WITH RETROGRADE PYELOGRAM, URETEROSCOPY AND STENT PLACEMENT Left 12/24/2021   Procedure: CYSTOSCOPY WITH RETROGRADE PYELOGRAM, URETEROSCOPY AND STENT REPLACEMENT;  Surgeon: Robley Fries, MD;  [facility];  Service: Urology;  Laterality: Left;   GANGLION CYST EXCISION  2008   Rt wrist   HOLMIUM LASER APPLICATION Left 06/22/3543   Procedure: HOLMIUM LASER APPLICATION;  Surgeon: Robley Fries, MD;  [facility];  Service: Urology;  Laterality: Left;  IR NEPHROSTOGRAM LEFT THRU EXISTING ACCESS  11/14/2021   IR URETERAL STENT PLACEMENT EXISTING ACCESS LEFT  11/12/2021   JOINT REPLACEMENT  2010   rt total knee   KNEE ARTHROSCOPY Right 09/2001   Rt knee   NEPHROLITHOTOMY Left 11/12/2021   Procedure: NEPHROLITHOTOMY PERCUTANEOUS;  Surgeon: Robley Fries, MD;  [facility];  Service: Urology;  Laterality: Left;  3 HRS   OVARIAN CYST SURGERY Left    age 60 and appendectomy   RECTOCELE REPAIR  2009   REDUCTION MAMMAPLASTY Bilateral    THYROIDECTOMY N/A 06/17/2019   Procedure: TOTAL  THYROIDECTOMY;  Surgeon: Armandina Gemma, MD;  [facility];  Service: General;  Laterality: N/A;   TOTAL KNEE ARTHROPLASTY Left 12/06/2018   Procedure: Left Knee Arthroplasty;  Surgeon: Frederik Pear, MD;  [facility];  Service: Orthopedics;  Laterality: Left;   UPPER GI ENDOSCOPY       Current Outpatient Medications  Medication Sig Dispense Refill   acetaminophen (TYLENOL) 325 MG tablet Take 650 mg by mouth every 6 (six) hours as needed for moderate pain or headache.     amLODipine (NORVASC) 5 MG tablet TAKE ONE TABLET DAILY 90 tablet 1   furosemide (LASIX) 40 MG tablet TAKE ONE TABLET DAILY 90 tablet 3   levothyroxine (SYNTHROID) 200 MCG tablet Take 1 tablet (200 mcg total) by mouth as directed. 2 tablets on Sunday, 1 tablet daily Monday through Saturday 98 tablet 3   promethazine (PHENERGAN) 25 MG tablet Take 1 tablet (25 mg total) by mouth every 8 (eight) hours as needed for nausea or vomiting. 20 tablet 0   traMADol (ULTRAM) 50 MG tablet Take 1 tablet (50 mg total) by mouth every 6 (six) hours as needed. 20 tablet 0   No current facility-administered medications for this visit.    Allergies:   Patient has no known allergies.    Social History:  The patient  reports that she has never smoked. She has never used smokeless tobacco. She reports that she does not drink alcohol and does not use drugs.   Family History:  The patient's family history includes Breast cancer (age of onset: 84) in her daughter; Diabetes in her brother; Heart attack in her brother; Heart attack (age of onset: 48) in her father; Heart disease in her brother, father, and mother.    ROS:  Please see the history of present illness.   Otherwise, review of systems are positive for none.   All other systems are reviewed and negative.    PHYSICAL EXAM: VS:  BP 138/88 (BP Location: Left Arm, Patient Position: Sitting, Cuff Size: Large)   Pulse 90   Ht 5' 5"  (1.651 m)   Wt 218 lb 12.8 oz (99.2 kg)   SpO2 96%    BMI 36.41 kg/m  , BMI Body mass index is 36.41 kg/m. GENERAL:  Well appearing HEENT:  Pupils equal round and reactive, fundi not visualized, oral mucosa unremarkable NECK:  No jugular venous distention, waveform within normal limits, carotid upstroke brisk and symmetric, no bruits, no thyromegaly LUNGS:  Clear to auscultation bilaterally HEART:  RRR.  PMI not displaced or sustained,S1 and S2 within normal limits, no S3, no S4, no clicks, no rubs, no murmurs ABD:  Flat, positive bowel sounds normal in frequency in pitch, no bruits, no rebound, no guarding, no midline pulsatile mass, no hepatomegaly, no splenomegaly EXT:  2 plus pulses throughout, bilateral lymphedema, no cyanosis no clubbing SKIN:  No rashes no nodules NEURO:  Cranial nerves II through XII grossly intact, motor grossly intact throughout [facility]:  Cognitively intact, oriented to person place and time  EKG:   02/10/2022: EKG is not ordered today. 11/06/2021: EKG showed sinus rhythm.  Rate 90 bpm.  Low voltage.   Recent Labs: 12/16/2021: Hemoglobin 13.4; Platelets 184 01/20/2022: TSH 2.52 02/10/2022: ALT 14; BUN 14; Creatinine, Ser 1.09; Potassium 3.9; Sodium 142   Lexiscan Myoview 10/2021:   The study is normal. The study is low risk.   No ST deviation was noted.   LV perfusion is normal. There is no evidence of ischemia. There is no evidence of infarction.   Left ventricular function is normal. Nuclear stress EF: 62 %. The left ventricular ejection fraction is normal (55-65%). End diastolic cavity size is normal. End systolic cavity size is normal.   Prior study not available for comparison.    Lipid Panel    Component Value Date/Time   CHOL 242 (H) 02/10/2022 1004   TRIG 115 02/10/2022 1004   HDL 58 02/10/2022 1004   CHOLHDL 4.2 02/10/2022 1004   CHOLHDL 3.9 05/14/2021 1152   VLDL 20 11/17/2016 1013   LDLCALC 164 (H) 02/10/2022 1004   LDLCALC 128 (H) 05/14/2021 1152      Wt Readings from Last 3 Encounters:   02/10/22 218 lb 12.8 oz (99.2 kg)  01/20/22 221 lb (100.2 kg)  12/24/21 261 lb 0.4 oz (118.4 kg)      ASSESSMENT AND PLAN:  Hypertension Blood pressure slightly above goal.  She has been trying to exercise.  She got a stress fracture in her foot that has slowed her down.  She has no exertional symptoms.  Continue amlodipne.   Aortic atherosclerosis ([facility]) Aortic atherosclerosis noted on her CT scan.  LDL goal is less than 70.  She has been hesitant to start medications.  She is working on diet and exercise.  She is also lost 10 pounds.  We will check fasting lipids and a CMP today.   Current medicines are reviewed at length with the patient today.  The patient does not have concerns regarding medicines.  The following changes have been made:  no change  Labs/ tests ordered today include:   Orders Placed This Encounter  Procedures   Lipid panel   Comp Met (CMET)     Disposition:  F/u in 1 year   I,Alexis Herring,acting as a scribe for Skeet Latch, MD.,have documented all relevant documentation on the behalf of Skeet Latch, MD,as directed by  Skeet Latch, MD while in the presence of Skeet Latch, MD.  I, [place] Oval Linsey, MD have reviewed all documentation for this visit.  The documentation of the exam, diagnosis, procedures, and orders on 02/11/2022 are all accurate and complete.   Signed, Bernadette Armijo C. Oval Linsey, MD, [facility]  02/11/2022 12:44 PM    [facility]

## 2022-02-10 NOTE — Progress Notes (Deleted)
Cardiology Office Note   Date:  02/10/2022   ID:  Cassidy [name], DOB [birth year], MRN [id number]  PCP:  Cassidy Showers, MD  Cardiologist:   Cassidy Latch, MD   No chief complaint on file.   History of Present Illness: Cassidy [name] is a 81 y.o. female with obesity, hypertension, pre-diabetes, papillary thyroid cancer status post thyroidectomy, prior pulmonary embolism, hyperlipidemia, and remote melanoma here for follow-up.  She was first seen 10/2021  for the evaluation of preoperative risk assessment.  She has had recurrent urinary tract infections due to staghorn calculi.  She saw Dr. Renold [name] for preoperative risk assessment.  EKG 09/2021 revealed sinus rhythm with LAFB.  She was scheduled to have percutaneous ephrolithotomy with general anesthesia on 11/12/2021.  They have requested that she hold aspirin for 5 days preoperatively.  At that visit she noted that she was not very physically active and got short of breath with walking short distances.  She was referred for [facility] 10/2021 which revealed LVEF 62% and no ischemia.  Past Medical History:  Diagnosis Date   Cancer [facility])    right foot,top of arm, lympth nodes removed   Dependent edema    DVT (deep venous thrombosis) (HCC)    Fatty liver    Heart murmur    occ   High risk surgery, pre-operative cardiovascular examination 11/06/2021   History of kidney stones    HTN (hypertension), benign    Hyperlipidemia    Hypothyroidism    Lymphedema    bi lat leggs   Melanoma ([place])    Migraine 08/27/2010   Osteopenia    PE (pulmonary embolism) 2010   bilateral hx post op knee replacement   PONV (postoperative nausea and vomiting)    Pure hypercholesterolemia 11/06/2021   Shortness of breath 11/06/2021   on exertion    Past Surgical History:  Procedure Laterality Date   ABDOMINAL HYSTERECTOMY     age 34   AMPUTATION  11/10/2011   Procedure: AMPUTATION RAY;  Surgeon: Cassidy Salen, MD;  Location: [facility];  Service: Orthopedics;  Laterality: Left;  LEFT 2ND TOE TRANS MIDDLE PHALANX AMPUTATION    ANKLE FUSION  2005   lt-fusion   CHOLECYSTECTOMY  02/14/2002   COLONOSCOPY     CYSTOSCOPY WITH RETROGRADE PYELOGRAM, URETEROSCOPY AND STENT PLACEMENT Left 12/24/2021   Procedure: CYSTOSCOPY WITH RETROGRADE PYELOGRAM, URETEROSCOPY AND STENT REPLACEMENT;  Surgeon: Cassidy Fries, MD;  Location: [facility];  Service: Urology;  Laterality: Left;   GANGLION CYST EXCISION  2008   Rt wrist   HOLMIUM LASER APPLICATION Left 05/25/2681   Procedure: HOLMIUM LASER APPLICATION;  Surgeon: Cassidy Fries, MD;  Location: [facility];  Service: Urology;  Laterality: Left;   IR NEPHROSTOGRAM LEFT THRU EXISTING ACCESS  11/14/2021   IR URETERAL STENT PLACEMENT EXISTING ACCESS LEFT  11/12/2021   JOINT REPLACEMENT  2010   rt total knee   KNEE ARTHROSCOPY Right 09/2001   Rt knee   NEPHROLITHOTOMY Left 11/12/2021   Procedure: NEPHROLITHOTOMY PERCUTANEOUS;  Surgeon: Cassidy Fries, MD;  Location: [facility];  Service: Urology;  Laterality: Left;  3 HRS   OVARIAN CYST SURGERY Left    age 83 and appendectomy   RECTOCELE REPAIR  2009   REDUCTION MAMMAPLASTY Bilateral    THYROIDECTOMY N/A 06/17/2019   Procedure: TOTAL THYROIDECTOMY;  Surgeon: Cassidy Gemma, MD;  Location: [facility];  Service: General;  Laterality: N/A;   TOTAL KNEE ARTHROPLASTY Left  12/06/2018   Procedure: Left Knee Arthroplasty;  Surgeon: Cassidy Pear, MD;  Location: [facility];  Service: Orthopedics;  Laterality: Left;   UPPER GI ENDOSCOPY       Current Outpatient Medications  Medication Sig Dispense Refill   acetaminophen (TYLENOL) 325 MG tablet Take 650 mg by mouth every 6 (six) hours as needed for moderate pain or headache.     ALPRAZolam (XANAX) 0.25 MG tablet Take 1 tablet (0.25 mg total) by mouth at bedtime as needed for anxiety. (Patient not taking: Reported on 12/13/2021) 30 tablet 1   amLODipine (NORVASC) 5 MG tablet TAKE ONE TABLET DAILY 90  tablet 1   furosemide (LASIX) 40 MG tablet TAKE ONE TABLET DAILY 90 tablet 3   levothyroxine (SYNTHROID) 200 MCG tablet Take 1 tablet (200 mcg total) by mouth as directed. 2 tablets on Sunday, 1 tablet daily Monday through Saturday 98 tablet 3   promethazine (PHENERGAN) 25 MG tablet Take 1 tablet (25 mg total) by mouth every 8 (eight) hours as needed for nausea or vomiting. (Patient not taking: Reported on 01/20/2022) 20 tablet 0   traMADol (ULTRAM) 50 MG tablet Take 1 tablet (50 mg total) by mouth every 6 (six) hours as needed. (Patient not taking: Reported on 01/20/2022) 20 tablet 0   traMADol (ULTRAM) 50 MG tablet Take 1 tablet (50 mg total) by mouth every 6 (six) hours as needed. (Patient not taking: Reported on 01/20/2022) 20 tablet 0   trimethoprim (TRIMPEX) 100 MG tablet Take 100 mg by mouth at bedtime. (Patient not taking: Reported on 01/20/2022)     No current facility-administered medications for this visit.    Allergies:   Patient has no known allergies.    Social History:  The patient  reports that she has never smoked. She has never used smokeless tobacco. She reports that she does not drink alcohol and does not use drugs.   Family History:  The patient's family history includes Breast cancer (age of onset: 61) in her daughter; Diabetes in her brother; Heart attack in her brother; Heart attack (age of onset: 67) in her father; Heart disease in her brother, father, and mother.    ROS:  Please see the history of present illness.   Otherwise, review of systems are positive for none.   All other systems are reviewed and negative.    PHYSICAL EXAM: VS:  There were no vitals taken for this visit. , BMI There is no height or weight on file to calculate BMI. GENERAL:  Well appearing HEENT:  Pupils equal round and reactive, fundi not visualized, oral mucosa unremarkable NECK:  No jugular venous distention, waveform within normal limits, carotid upstroke brisk and symmetric, no bruits, no  thyromegaly LUNGS:  Clear to auscultation bilaterally HEART:  RRR.  PMI not displaced or sustained,S1 and S2 within normal limits, no S3, no S4, no clicks, no rubs, no murmurs ABD:  Flat, positive bowel sounds normal in frequency in pitch, no bruits, no rebound, no guarding, no midline pulsatile mass, no hepatomegaly, no splenomegaly EXT:  2 plus pulses throughout, bilateral lymphedema, no cyanosis no clubbing SKIN:  No rashes no nodules NEURO:  Cranial nerves II through XII grossly intact, motor grossly intact throughout PSYCH:  Cognitively intact, oriented to person place and time  EKG:  EKG is ordered today. The ekg ordered today demonstrates sinus rhythm.  Rate 90 bpm.  Low voltage.   Recent Labs: 09/24/2021: ALT 15 12/16/2021: BUN 20; Creatinine, Ser 1.00; Hemoglobin 13.4; Platelets 184;  Potassium 4.5; Sodium 140 01/20/2022: TSH 2.52   Lexiscan Myoview 10/2021:   The study is normal. The study is low risk.   No ST deviation was noted.   LV perfusion is normal. There is no evidence of ischemia. There is no evidence of infarction.   Left ventricular function is normal. Nuclear stress EF: 62 %. The left ventricular ejection fraction is normal (55-65%). End diastolic cavity size is normal. End systolic cavity size is normal.   Prior study not available for comparison.    Lipid Panel    Component Value Date/Time   CHOL 208 (H) 05/14/2021 1152   TRIG 155 (H) 05/14/2021 1152   HDL 53 05/14/2021 1152   CHOLHDL 3.9 05/14/2021 1152   VLDL 20 11/17/2016 1013   LDLCALC 128 (H) 05/14/2021 1152      Wt Readings from Last 3 Encounters:  01/20/22 221 lb (100.2 kg)  12/24/21 261 lb 0.4 oz (118.4 kg)  12/16/21 261 lb (118.4 kg)      ASSESSMENT AND PLAN:  No problem-specific Assessment & Plan notes found for this encounter.     Current medicines are reviewed at length with the patient today.  The patient does not have concerns regarding medicines.  The following changes have been  made:  no change  Labs/ tests ordered today include:   No orders of the defined types were placed in this encounter.    Disposition:   FU with Tika Hannis C. Oval Linsey, MD, [facility] in 3 months     Signed, Donnel Venuto C. Oval Linsey, MD, [facility]  02/10/2022 8:03 AM    [place]

## 2022-02-10 NOTE — Patient Instructions (Signed)
Medication Instructions:  Continue current medications  *If you need a refill on your cardiac medications before your next appointment, please call your pharmacy*   Lab Work: Fasting Lipids and CMP Today  If you have labs (blood work) drawn today and your tests are completely normal, you will receive your results only by: [place] (if you have MyChart) OR A paper copy in the mail If you have any lab test that is abnormal or we need to change your treatment, we will call you to review the results.   Testing/Procedures: None Ordered   Follow-Up: At [facility], you and your health needs are our priority.  As part of our continuing mission to provide you with exceptional heart care, we have created designated Provider Care Teams.  These Care Teams include your primary Cardiologist (physician) and Advanced Practice Providers (APPs -  Physician Assistants and Nurse Practitioners) who all work together to provide you with the care you need, when you need it.  We recommend signing up for the patient portal called "MyChart".  Sign up information is provided on this After Visit Summary.  MyChart is used to connect with patients for Virtual Visits (Telemedicine).  Patients are able to view lab/test results, encounter notes, upcoming appointments, etc.  Non-urgent messages can be sent to your provider as well.   To learn more about what you can do with MyChart, go to [email or web address].    Your next appointment:   1 year(s)  The format for your next appointment:   In Person  Provider:   Skeet Latch, MD    Other Instructions

## 2022-02-10 NOTE — Assessment & Plan Note (Signed)
Aortic atherosclerosis noted on her CT scan.  LDL goal is less than 70.  She has been hesitant to start medications.  She is working on diet and exercise.  She is also lost 10 pounds.  We will check fasting lipids and a CMP today.

## 2022-02-10 NOTE — Assessment & Plan Note (Addendum)
Blood pressure slightly above goal.  She has been trying to exercise.  She got a stress fracture in her foot that has slowed her down.  She has no exertional symptoms.  Continue amlodipne.

## 2022-02-11 ENCOUNTER — Encounter (HOSPITAL_BASED_OUTPATIENT_CLINIC_OR_DEPARTMENT_OTHER): Payer: Self-pay | Admitting: Cardiovascular Disease

## 2022-02-11 LAB — LIPID PANEL
Chol/HDL Ratio: 4.2 ratio (ref 0.0–4.4)
Cholesterol, Total: 242 mg/dL — ABNORMAL HIGH (ref 100–199)
HDL: 58 mg/dL (ref >39)
LDL Chol Calc (NIH): 164 mg/dL — ABNORMAL HIGH (ref 0–99)
Triglycerides: 115 mg/dL (ref 0–149)
VLDL Cholesterol Cal: 20 mg/dL (ref 5–40)

## 2022-02-11 LAB — COMPREHENSIVE METABOLIC PANEL
ALT: 14 IU/L (ref 0–32)
AST: 24 IU/L (ref 0–40)
Albumin/Globulin Ratio: 1.9 (ref 1.2–2.2)
Albumin: 4.8 g/dL — ABNORMAL HIGH (ref 3.7–4.7)
Alkaline Phosphatase: 102 IU/L (ref 44–121)
BUN/Creatinine Ratio: 13 (ref 12–28)
BUN: 14 mg/dL (ref 8–27)
Bilirubin Total: 0.6 mg/dL (ref 0.0–1.2)
CO2: 26 mmol/L (ref 20–29)
Calcium: 9.6 mg/dL (ref 8.7–10.3)
Chloride: 101 mmol/L (ref 96–106)
Creatinine, Ser: 1.09 mg/dL — ABNORMAL HIGH (ref 0.57–1.00)
Globulin, Total: 2.5 g/dL (ref 1.5–4.5)
Glucose: 91 mg/dL (ref 70–99)
Potassium: 3.9 mmol/L (ref 3.5–5.2)
Sodium: 142 mmol/L (ref 134–144)
Total Protein: 7.3 g/dL (ref 6.0–8.5)
eGFR: 51 mL/min/{1.73_m2} — ABNORMAL LOW (ref >59)

## 2022-03-22 ENCOUNTER — Other Ambulatory Visit: Payer: Self-pay | Admitting: Internal Medicine

## 2022-04-07 ENCOUNTER — Ambulatory Visit: Payer: Medicare Other | Admitting: Podiatry

## 2022-04-08 ENCOUNTER — Encounter: Payer: Self-pay | Admitting: Cardiovascular Disease

## 2022-04-08 NOTE — Telephone Encounter (Signed)
Error

## 2022-05-20 ENCOUNTER — Other Ambulatory Visit: Payer: Medicare Other

## 2022-05-20 DIAGNOSIS — E039 Hypothyroidism, unspecified: Secondary | ICD-10-CM

## 2022-05-20 DIAGNOSIS — I10 Essential (primary) hypertension: Secondary | ICD-10-CM

## 2022-05-20 DIAGNOSIS — E7800 Pure hypercholesterolemia, unspecified: Secondary | ICD-10-CM

## 2022-05-20 DIAGNOSIS — E119 Type 2 diabetes mellitus without complications: Secondary | ICD-10-CM

## 2022-05-22 ENCOUNTER — Telehealth: Payer: Self-pay | Admitting: Internal Medicine

## 2022-05-22 NOTE — Telephone Encounter (Signed)
LVM to CB and reschedule labs

## 2022-05-23 NOTE — Telephone Encounter (Signed)
scheduled

## 2022-05-26 ENCOUNTER — Other Ambulatory Visit: Payer: Medicare Other

## 2022-05-26 DIAGNOSIS — I10 Essential (primary) hypertension: Secondary | ICD-10-CM | POA: Diagnosis not present

## 2022-05-26 DIAGNOSIS — E7800 Pure hypercholesterolemia, unspecified: Secondary | ICD-10-CM | POA: Diagnosis not present

## 2022-05-26 DIAGNOSIS — E039 Hypothyroidism, unspecified: Secondary | ICD-10-CM

## 2022-05-26 DIAGNOSIS — E119 Type 2 diabetes mellitus without complications: Secondary | ICD-10-CM

## 2022-05-26 DIAGNOSIS — Z0289 Encounter for other administrative examinations: Secondary | ICD-10-CM

## 2022-05-26 NOTE — Progress Notes (Signed)
Annual Wellness Visit    Patient Care Team: Elby Showers, MD as PCP - General (Internal Medicine)  Visit Date: 05/27/22   Chief Complaint  Patient presents with   Medicare Wellness   Annual Exam    Subjective:   Patient: Cassidy [name], Female    DOB: [birth year], 82 y.o.   MRN: [id number]  Cassidy [name] is a 82 y.o. Female who presents today for her Annual Wellness Visit. She has a history of aortic atherosclerosis, cancer, dependent edema, deep venous thrombosis, heart murmur, fatty liver, kidney stones, benign hypertension, hypothyroidism, lymphedema, melanoma, migraines, osteopenia, pulmonary embolism, hypercholesterolemia.  Reports feeling generally well.  History of kidney stones. Surgical kidney stone removal recently. Kidney function normal. Creatinine at 1.03, BUN at 13 on 05/26/22.  History of depended edema treated with Lasix 40 mg daily. Wraps ankles and soaks them regularly.  History of hypertension treated with Norvasc 5 mg daily.   History of hypothyroidism treated with Synthroid 200 mcg one tablet Mon-Sat and two tablets on Sunday. TSH normal at 3.65 on 05/26/22.  History of arthritis treated with Ultram 50 mg every 6 hours as needed.  HGBA1C at 5.8 on 05/26/22. LDL at 119 on 05/26/22, down from 128 on 05/14/21. She is not interested in using cholesterol medication at this time.  DEXA scan last completed 05/28/2004. Results showed osteoporosis.  Negative Cologuard 06/05/21.  Mammogram last completed 01/18/18. Results show probably benign asymmetry within the posterior left breast, not significantly changed in the interval, again most likely postsurgical change related to previous reduction mammoplasty. Recommended bilateral diagnostic mammogram in 6 months.   Past Medical History:  Diagnosis Date   Aortic atherosclerosis ([place]) 02/10/2022   Cancer ([place])    right foot,top of arm, lympth nodes removed   Degenerative arthritis of left knee    Dependent edema     DVT (deep venous thrombosis) (HCC)    Fatty liver    Heart murmur    occ   High risk surgery, pre-operative cardiovascular examination 11/06/2021   History of kidney stones    HTN (hypertension), benign    Hyperlipidemia    Hypothyroidism    Lymphedema    bi lat leggs   Melanoma ([place])    Migraine 08/27/2010   Osteopenia    PE (pulmonary embolism) 2010   bilateral hx post op knee replacement   PONV (postoperative nausea and vomiting)    Pure hypercholesterolemia 11/06/2021   Shortness of breath 11/06/2021   on exertion     Family History  Problem Relation Age of Onset   Heart disease Mother    Heart attack Father 58   Heart disease Father    Heart attack Brother    Diabetes Brother    Heart disease Brother    Breast cancer Daughter 36     Social History   Social History Narrative   Social history: Divorced.  Resides alone.  Does not smoke.  Social alcohol consumption.  2 adult children, a son and a daughter.  Daughter with history of breast cancer doing well.  3 granddaughters in good health.       Family history: Father died at age 65 of a sudden MI.  Mother died at age 77 of heart problems.  1 brother deceased in an airplane crash at age 44.  2 sisters in good health.         Review of Systems  Constitutional:  Negative for chills, fever, malaise/fatigue and weight loss.  HENT:  Negative for hearing loss, sinus pain and sore throat.   Respiratory:  Negative for cough and hemoptysis.   Cardiovascular:  Negative for chest pain, palpitations, leg swelling and PND.  Gastrointestinal:  Negative for abdominal pain, constipation, diarrhea, heartburn, nausea and vomiting.  Genitourinary:  Negative for dysuria, frequency and urgency.  Musculoskeletal:  Negative for back pain, myalgias and neck pain.  Skin:  Negative for itching and rash.  Neurological:  Negative for dizziness, tingling, seizures and headaches.  Endo/Heme/Allergies:  Negative for polydipsia.   Psychiatric/Behavioral:  Negative for depression. The patient is not nervous/anxious.       Objective:   Vitals: BP 128/82   Pulse 96   Temp 98.6 F (37 C) (Tympanic)   Ht 5' 5"$  (1.651 m)   Wt 224 lb (101.6 kg)   SpO2 97%   BMI 37.28 kg/m   Physical Exam Vitals and nursing note reviewed. Exam conducted with a chaperone present.  Constitutional:      General: She is not in acute distress.    Appearance: Normal appearance. She is not ill-appearing or toxic-appearing.  HENT:     Head: Normocephalic and atraumatic.     Right Ear: Hearing, tympanic membrane, ear canal and external ear normal.     Left Ear: Hearing, tympanic membrane, ear canal and external ear normal.     Mouth/Throat:     Pharynx: Oropharynx is clear.  Eyes:     Extraocular Movements: Extraocular movements intact.     Pupils: Pupils are equal, round, and reactive to light.  Neck:     Thyroid: No thyroid mass, thyromegaly or thyroid tenderness.     Vascular: No carotid bruit.  Cardiovascular:     Rate and Rhythm: Normal rate and regular rhythm. No extrasystoles are present.    Pulses: Normal pulses.          Dorsalis pedis pulses are 2+ on the right side and 2+ on the left side.       Posterior tibial pulses are 2+ on the right side and 2+ on the left side.     Heart sounds: Normal heart sounds. No murmur heard.    No friction rub. No gallop.  Pulmonary:     Effort: Pulmonary effort is normal.     Breath sounds: Normal breath sounds. No decreased breath sounds, wheezing, rhonchi or rales.  Chest:     Chest wall: No mass.  Abdominal:     Palpations: Abdomen is soft. There is no hepatomegaly, splenomegaly or mass.     Tenderness: There is no abdominal tenderness.     Hernia: No hernia is present.  Musculoskeletal:     Cervical back: Normal range of motion.     Right lower leg: No edema.     Left lower leg: No edema.  Lymphadenopathy:     Cervical: No cervical adenopathy.     Upper Body:     Right  upper body: No supraclavicular adenopathy.     Left upper body: No supraclavicular adenopathy.  Skin:    General: Skin is warm and dry.  Neurological:     General: No focal deficit present.     Mental Status: She is alert and oriented to person, place, and time. Mental status is at baseline.     Sensory: Sensation is intact.     Motor: Motor function is intact. No weakness.     Deep Tendon Reflexes: Reflexes are normal and symmetric.  Psychiatric:  Attention and Perception: Attention normal.        Mood and Affect: Mood normal.        Speech: Speech normal.        Behavior: Behavior normal.        Thought Content: Thought content normal.        Cognition and Memory: Cognition normal.        Judgment: Judgment normal.      Most recent functional status assessment:    05/27/2022   10:08 AM  In your present state of health, do you have any difficulty performing the following activities:  Hearing? 0  Vision? 0  Difficulty concentrating or making decisions? 0  Walking or climbing stairs? 0  Dressing or bathing? 0  Doing errands, shopping? 0  Preparing Food and eating ? N  Using the Toilet? N  In the past six months, have you accidently leaked urine? Y  Do you have problems with loss of bowel control? N  Managing your Medications? N  Managing your Finances? N  Housekeeping or managing your Housekeeping? N   Most recent fall risk assessment:    05/27/2022   10:08 AM  Fall Risk   Falls in the past year? 0  Number falls in past yr: 0  Injury with Fall? 0  Risk for fall due to : No Fall Risks  Follow up Falls prevention discussed    Most recent depression screenings:    05/27/2022   10:08 AM 05/20/2021   10:15 AM  PHQ 2/9 Scores  PHQ - 2 Score 0 0   Most recent cognitive screening:    05/27/2022   10:11 AM  6CIT Screen  What Year? 0 points  What month? 0 points  What time? 0 points  Count back from 20 0 points  Months in reverse 0 points  Repeat phrase 0  points  Total Score 0 points     Results:   Studies obtained and personally reviewed by me:  Dexa Scan last completed 05/28/2004. Results showed osteoporosis.  Negative Cologuard 06/05/21.  Mammogram last completed 01/18/18. Results show probably benign asymmetry within the posterior LEFT breast, not significantly changed in the interval, again most likely postsurgical change related to previous reduction mammoplasty. Recommended bilateral diagnostic mammogram in 6 months.   Labs:       Component Value Date/Time   NA 142 05/26/2022 0920   NA 142 02/10/2022 1004   K 4.4 05/26/2022 0920   CL 103 05/26/2022 0920   CO2 28 05/26/2022 0920   GLUCOSE 98 05/26/2022 0920   BUN 13 05/26/2022 0920   BUN 14 02/10/2022 1004   CREATININE 1.03 (H) 05/26/2022 0920   CALCIUM 9.4 05/26/2022 0920   PROT 7.3 05/26/2022 0920   PROT 7.3 02/10/2022 1004   ALBUMIN 4.8 (H) 02/10/2022 1004   AST 25 05/26/2022 0920   ALT 19 05/26/2022 0920   ALKPHOS 102 02/10/2022 1004   BILITOT 0.7 05/26/2022 0920   BILITOT 0.6 02/10/2022 1004   GFRNONAA 57 (L) 12/16/2021 0830   GFRNONAA 50 (L) 05/14/2020 1109   GFRAA 58 (L) 05/14/2020 1109     Lab Results  Component Value Date   WBC 6.2 05/26/2022   HGB 14.0 05/26/2022   HCT 42.2 05/26/2022   MCV 90.2 05/26/2022   PLT 219 05/26/2022    Lab Results  Component Value Date   CHOL 197 05/26/2022   HDL 58 05/26/2022   LDLCALC 119 (H) 05/26/2022   TRIG 95 05/26/2022  CHOLHDL 3.4 05/26/2022    Lab Results  Component Value Date   HGBA1C 5.8 (H) 05/26/2022     Lab Results  Component Value Date   TSH 3.65 05/26/2022    Assessment & Plan:   Kidney Stones: Surgical kidney stone removal recently. Kidney function normal. Creatinine at 1.03, BUN at 13 on 05/26/22.  Dependent Edema: Well-controlled with Lasix 40 mg daily. Wraps ankles and soaks them regularly.  History of hypertension: Well-controlled with Norvasc 5 mg daily.   Hypothyroidism:  Well-controlled with Synthroid 200 mcg one tablet Mon-Sat and two tablets on Sunday. TSH normal at 3.65 on 05/26/22.  Arthritis: Well-controlled with Ultram 50 mg every 6 hours as needed.  DEXA Scan: Last completed 05/28/2004. Results showed osteoporosis.  Mammogram: Last completed 01/18/18. Results show probably benign asymmetry within the posterior left breast, not significantly changed in the interval, again most likely postsurgical change related to previous reduction mammoplasty. Recommended bilateral diagnostic mammogram in 6 months.  Negative Cologuard 06/05/21. Advised to use Hemoccult tests in future.  Vaccine Counseling: Advised to receive flu, tetanus, shingles, Covid-19 vaccines at pharmacy. Pneumococcal 20 vaccine administered.       Annual wellness visit done today including the all of the following: Reviewed patient's Family Medical History Reviewed and updated list of patient's medical providers Assessment of cognitive impairment was done Assessed patient's functional ability Established a written schedule for health screening [place] Completed and Reviewed  Discussed health benefits of physical activity, and encouraged her to engage in regular exercise appropriate for her age and condition.        I,Alexander Ruley,acting as a Education administrator for Elby Showers, MD.,have documented all relevant documentation on the behalf of Elby Showers, MD,as directed by  Elby Showers, MD while in the presence of Elby Showers, MD.   I, Elby Showers, MD, have reviewed all documentation for this visit. The documentation on 06/08/22 for the exam, diagnosis, procedures, and orders are all accurate and complete.

## 2022-05-27 ENCOUNTER — Encounter: Payer: Self-pay | Admitting: Internal Medicine

## 2022-05-27 ENCOUNTER — Ambulatory Visit (INDEPENDENT_AMBULATORY_CARE_PROVIDER_SITE_OTHER): Payer: Medicare Other | Admitting: Internal Medicine

## 2022-05-27 ENCOUNTER — Telehealth: Payer: Self-pay

## 2022-05-27 VITALS — BP 128/82 | HR 96 | Temp 98.6°F | Ht 65.0 in | Wt 224.0 lb

## 2022-05-27 DIAGNOSIS — E039 Hypothyroidism, unspecified: Secondary | ICD-10-CM | POA: Diagnosis not present

## 2022-05-27 DIAGNOSIS — E119 Type 2 diabetes mellitus without complications: Secondary | ICD-10-CM | POA: Diagnosis not present

## 2022-05-27 DIAGNOSIS — R609 Edema, unspecified: Secondary | ICD-10-CM | POA: Diagnosis not present

## 2022-05-27 DIAGNOSIS — Z0000 Encounter for general adult medical examination without abnormal findings: Secondary | ICD-10-CM

## 2022-05-27 DIAGNOSIS — R82998 Other abnormal findings in urine: Secondary | ICD-10-CM | POA: Diagnosis not present

## 2022-05-27 DIAGNOSIS — Z85820 Personal history of malignant melanoma of skin: Secondary | ICD-10-CM | POA: Diagnosis not present

## 2022-05-27 DIAGNOSIS — I10 Essential (primary) hypertension: Secondary | ICD-10-CM

## 2022-05-27 DIAGNOSIS — Z23 Encounter for immunization: Secondary | ICD-10-CM | POA: Diagnosis not present

## 2022-05-27 DIAGNOSIS — Z86711 Personal history of pulmonary embolism: Secondary | ICD-10-CM

## 2022-05-27 DIAGNOSIS — Z8659 Personal history of other mental and behavioral disorders: Secondary | ICD-10-CM | POA: Diagnosis not present

## 2022-05-27 DIAGNOSIS — Z6837 Body mass index (BMI) 37.0-37.9, adult: Secondary | ICD-10-CM

## 2022-05-27 LAB — MICROALBUMIN / CREATININE URINE RATIO
Creatinine, Urine: 58 mg/dL (ref 20–275)
Microalb Creat Ratio: 22 mcg/mg creat (ref <30)
Microalb, Ur: 1.3 mg/dL

## 2022-05-27 LAB — LIPID PANEL
Cholesterol: 197 mg/dL (ref <200)
HDL: 58 mg/dL (ref >=50)
LDL Cholesterol (Calc): 119 mg/dL (calc) — ABNORMAL HIGH
Non-HDL Cholesterol (Calc): 139 mg/dL (calc) — ABNORMAL HIGH (ref <130)
Total CHOL/HDL Ratio: 3.4 (calc) (ref <5.0)
Triglycerides: 95 mg/dL (ref <150)

## 2022-05-27 LAB — POCT URINALYSIS DIPSTICK
Bilirubin, UA: NEGATIVE
Blood, UA: NEGATIVE
Glucose, UA: NEGATIVE
Ketones, UA: NEGATIVE
Nitrite, UA: NEGATIVE
Protein, UA: NEGATIVE
Spec Grav, UA: 1.01 (ref 1.010–1.025)
Urobilinogen, UA: 0.2 E.U./dL
pH, UA: 5 (ref 5.0–8.0)

## 2022-05-27 LAB — CBC WITH DIFFERENTIAL/PLATELET
Absolute Monocytes: 391 cells/uL (ref 200–950)
Basophils Absolute: 93 cells/uL (ref 0–200)
Basophils Relative: 1.5 %
Eosinophils Absolute: 682 cells/uL — ABNORMAL HIGH (ref 15–500)
Eosinophils Relative: 11 %
HCT: 42.2 % (ref 35.0–45.0)
Hemoglobin: 14 g/dL (ref 11.7–15.5)
Lymphs Abs: 2040 cells/uL (ref 850–3900)
MCH: 29.9 pg (ref 27.0–33.0)
MCHC: 33.2 g/dL (ref 32.0–36.0)
MCV: 90.2 fL (ref 80.0–100.0)
MPV: 10.4 fL (ref 7.5–12.5)
Monocytes Relative: 6.3 %
Neutro Abs: 2995 cells/uL (ref 1500–7800)
Neutrophils Relative %: 48.3 %
Platelets: 219 10*3/uL (ref 140–400)
RBC: 4.68 10*6/uL (ref 3.80–5.10)
RDW: 13 % (ref 11.0–15.0)
Total Lymphocyte: 32.9 %
WBC: 6.2 10*3/uL (ref 3.8–10.8)

## 2022-05-27 LAB — COMPLETE METABOLIC PANEL WITH GFR
AG Ratio: 1.7 (calc) (ref 1.0–2.5)
ALT: 19 U/L (ref 6–29)
AST: 25 U/L (ref 10–35)
Albumin: 4.6 g/dL (ref 3.6–5.1)
Alkaline phosphatase (APISO): 96 U/L (ref 37–153)
BUN/Creatinine Ratio: 13 (calc) (ref 6–22)
BUN: 13 mg/dL (ref 7–25)
CO2: 28 mmol/L (ref 20–32)
Calcium: 9.4 mg/dL (ref 8.6–10.4)
Chloride: 103 mmol/L (ref 98–110)
Creat: 1.03 mg/dL — ABNORMAL HIGH (ref 0.60–0.95)
Globulin: 2.7 g/dL (calc) (ref 1.9–3.7)
Glucose, Bld: 98 mg/dL (ref 65–99)
Potassium: 4.4 mmol/L (ref 3.5–5.3)
Sodium: 142 mmol/L (ref 135–146)
Total Bilirubin: 0.7 mg/dL (ref 0.2–1.2)
Total Protein: 7.3 g/dL (ref 6.1–8.1)
eGFR: 55 mL/min/{1.73_m2} — ABNORMAL LOW (ref >=60)

## 2022-05-27 LAB — TSH: TSH: 3.65 mIU/L (ref 0.40–4.50)

## 2022-05-27 LAB — HEMOGLOBIN A1C
Hgb A1c MFr Bld: 5.8 % of total Hgb — ABNORMAL HIGH (ref <5.7)
Mean Plasma Glucose: 120 mg/dL
eAG (mmol/L): 6.6 mmol/L

## 2022-05-27 NOTE — Patient Instructions (Signed)
It was a pleasure to see you today. Vaccines  discussed. To RTCe in 6 months for fasting lipid panel and no liver functions.

## 2022-05-27 NOTE — Telephone Encounter (Addendum)
I left voicemail for patient to call back to give date of Flu vaccine

## 2022-05-27 NOTE — Telephone Encounter (Signed)
Called patient back unable to reach her,  left voicemail.

## 2022-05-28 LAB — URINE CULTURE
MICRO NUMBER:: 14525497
SPECIMEN QUALITY:: ADEQUATE

## 2022-05-28 NOTE — Telephone Encounter (Signed)
Patient called back and stated that she thought she had gotten the Flu vaccine at [facility] with her daughter but was reminded by her daughter that they decided not get the vaccine and is sorry for misinforming Dr. Renold Genta and [employer].

## 2022-06-25 DIAGNOSIS — M79671 Pain in right foot: Secondary | ICD-10-CM | POA: Diagnosis not present

## 2022-06-26 ENCOUNTER — Other Ambulatory Visit (HOSPITAL_COMMUNITY): Payer: Self-pay | Admitting: Orthopaedic Surgery

## 2022-06-26 ENCOUNTER — Ambulatory Visit (HOSPITAL_COMMUNITY)
Admission: RE | Admit: 2022-06-26 | Discharge: 2022-06-26 | Disposition: A | Discharge status: Home / Self Care | Payer: Medicare Other | Source: Ambulatory Visit | Attending: Vascular Surgery | Admitting: Vascular Surgery

## 2022-06-26 DIAGNOSIS — M79605 Pain in left leg: Secondary | ICD-10-CM | POA: Insufficient documentation

## 2022-06-26 DIAGNOSIS — M79604 Pain in right leg: Secondary | ICD-10-CM | POA: Insufficient documentation

## 2022-07-21 ENCOUNTER — Encounter: Payer: Self-pay | Admitting: Internal Medicine

## 2022-07-21 ENCOUNTER — Ambulatory Visit (INDEPENDENT_AMBULATORY_CARE_PROVIDER_SITE_OTHER): Payer: Medicare Other | Admitting: Internal Medicine

## 2022-07-21 ENCOUNTER — Telehealth: Payer: Self-pay | Admitting: Internal Medicine

## 2022-07-21 VITALS — BP 126/72 | HR 90 | Ht 65.0 in | Wt 224.0 lb

## 2022-07-21 DIAGNOSIS — C73 Malignant neoplasm of thyroid gland: Secondary | ICD-10-CM | POA: Diagnosis not present

## 2022-07-21 DIAGNOSIS — E890 Postprocedural hypothyroidism: Secondary | ICD-10-CM | POA: Diagnosis not present

## 2022-07-21 LAB — TSH: TSH: 31.79 u[IU]/mL — ABNORMAL HIGH (ref 0.35–5.50)

## 2022-07-21 NOTE — Patient Instructions (Signed)

## 2022-07-21 NOTE — Telephone Encounter (Signed)
Please let the pt know that her thyroid test is very high, the current dose of levothyroxine is not enough.    I did verify compliance but please verify again and see if there's any chance that she has missed doses ?  Its just off that her previous test was normal and this is a quick change    If she is on it consistently, please ask her to take Levothyroxine 200 mg , ONE and half tablets DAILY from now on .    Please schedule her for repeat labs in 8 weeks   Thanks

## 2022-07-21 NOTE — Progress Notes (Unsigned)
Name: Cassidy [name]  MRN/ DOB: [id number], 21-May-1940    Age/ Sex: 82 y.o., female     PCP: Elby Showers, MD   Reason for Endocrinology Evaluation: [facility]     Initial Endocrinology Clinic Visit: 04/05/2020    PATIENT IDENTIFIER: Cassidy [name] is a 82 y.o., female with a past medical history of HTN, Hx of DVT's and melanoma . She has followed with [facility] since 04/05/2020  for consultative assistance with management of her MNG.      HISTORICAL SUMMARY:  Pt was noted to have a tracheal deviation on CXR during pre-op evaluation. A CT scan showed a left thyroid necrotic mass that measures ~ 6.5 cm. 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   Afirma came back benign for the 2.6 cm nodule but no afirma results  for the 5.9 nodule due to low follicular content.  She is S/P total thyroidectomy on 06/17/2019 with a pathology report of 6.0 cm PTC   Of note she has been on LT- 4 replacement for years   A Thyrogen WBS 09/2019 showed Focal activity in thyroid bed consistent with remnant thyroid tissue and potential metastatic lymph node. Follow up thyroid bed ultrasound was unrevealing.    She is S/P RAI 53.5 mCi I-131  On 11/30/2019  Post-Op WBS scan demonstrated a Focus of increased tracer uptake at LEFT thyroid bed consistent with thyroid remnant and a  less intense focus of uptake cranial to the more intense focus at the LEFT thyroid bed, question additional thyroid remnant versus regional metastatic lymph node.    SUBJECTIVE:    Today (07/21/2022):  [name] is here for a follow up on total thyroidectomy and a PTC diagnosis.    Weight is stable  She continues to wear a boot  intermittently for a right foot boot for a stress fracture  Denies palpitations  Denies  loose stools and diarrhea  No local neck symptoms   Levothyroxine 200 mcg,1.5 tabs on sundays and 1 tablet the rest of the week      HISTORY:  Past Medical History:  Past Medical History:  Diagnosis Date   Aortic atherosclerosis 02/10/2022   Cancer    right foot,top of arm, lympth nodes removed   Degenerative arthritis of left knee    Dependent edema    DVT (deep venous thrombosis)    Fatty liver    Heart murmur    occ   High risk surgery, pre-operative cardiovascular examination 11/06/2021   History of kidney stones    HTN (hypertension), benign    Hyperlipidemia    Hypothyroidism    Lymphedema    bi lat leggs   Melanoma    Migraine 08/27/2010   Osteopenia    PE (pulmonary embolism) 2010   bilateral hx post op knee replacement   PONV (postoperative nausea and vomiting)    Pure hypercholesterolemia 11/06/2021   Shortness of breath 11/06/2021   on exertion   Past Surgical History:  Past Surgical History:  Procedure Laterality Date   ABDOMINAL HYSTERECTOMY     age 47   AMPUTATION  11/10/2011   Procedure: AMPUTATION RAY;  Surgeon: Kerin Salen, MD;  Location: [facility];  Service: Orthopedics;  Laterality:  Left;  LEFT 2ND TOE TRANS MIDDLE PHALANX AMPUTATION    ANKLE FUSION  2005   lt-fusion   CHOLECYSTECTOMY  02/14/2002   COLONOSCOPY     CYSTOSCOPY WITH RETROGRADE PYELOGRAM, URETEROSCOPY AND STENT PLACEMENT Left 12/24/2021   Procedure: CYSTOSCOPY WITH RETROGRADE PYELOGRAM, URETEROSCOPY AND STENT REPLACEMENT;  Surgeon: Robley Fries, MD;  Location: [facility];  Service: Urology;  Laterality: Left;   GANGLION CYST EXCISION  2008   Rt wrist   HOLMIUM LASER APPLICATION Left [id number]   Procedure: HOLMIUM LASER APPLICATION;  Surgeon: Robley Fries, MD;  Location: [facility];  Service: Urology;  Laterality: Left;   IR NEPHROSTOGRAM LEFT THRU EXISTING ACCESS  11/14/2021    IR URETERAL STENT PLACEMENT EXISTING ACCESS LEFT  11/12/2021   JOINT REPLACEMENT  2010   rt total knee   KNEE ARTHROSCOPY Right 09/2001   Rt knee   NEPHROLITHOTOMY Left 11/12/2021   Procedure: NEPHROLITHOTOMY PERCUTANEOUS;  Surgeon: Robley Fries, MD;  Location: [facility];  Service: Urology;  Laterality: Left;  3 HRS   OVARIAN CYST SURGERY Left    age 58 and appendectomy   RECTOCELE REPAIR  2009   REDUCTION MAMMAPLASTY Bilateral    THYROIDECTOMY N/A 06/17/2019   Procedure: TOTAL THYROIDECTOMY;  Surgeon: Armandina Gemma, MD;  Location: [facility];  Service: General;  Laterality: N/A;   TOTAL KNEE ARTHROPLASTY Left 12/06/2018   Procedure: Left Knee Arthroplasty;  Surgeon: Frederik Pear, MD;  Location: [facility];  Service: Orthopedics;  Laterality: Left;   UPPER GI ENDOSCOPY     Social History:  reports that she has never smoked. She has never used smokeless tobacco. She reports that she does not drink alcohol and does not use drugs. Family History:  Family History  Problem Relation Age of Onset   Heart disease Mother    Heart attack Father 44   Heart disease Father    Heart attack Brother    Diabetes Brother    Heart disease Brother    Breast cancer Daughter 1     HOME MEDICATIONS: Allergies as of 07/21/2022   No Known Allergies      Medication List        Accurate as of July 21, 2022  8:21 AM. If you have any questions, ask your nurse or doctor.          acetaminophen 325 MG tablet Commonly known as: TYLENOL Take 650 mg by mouth every 6 (six) hours as needed for moderate pain or headache.   amLODipine 5 MG tablet Commonly known as: NORVASC TAKE ONE TABLET EVERY DAY   furosemide 40 MG tablet Commonly known as: LASIX TAKE ONE TABLET DAILY   Garlic 10 MG Caps Take by mouth.   levothyroxine 200 MCG tablet Commonly known as: SYNTHROID Take 1 tablet (200 mcg total) by mouth as directed. 2 tablets on Sunday, 1 tablet daily Monday through Saturday   promethazine 25 MG  tablet Commonly known as: PHENERGAN Take 1 tablet (25 mg total) by mouth every 8 (eight) hours as needed for nausea or vomiting.   Red Yeast Rice 55 MG Caps Take by mouth.          OBJECTIVE:   PHYSICAL EXAM: VS: BP 126/72 (BP Location: Left Arm, Patient Position: Sitting, Cuff Size: Large)   Pulse 90   Ht 5\' 5"  (1.651 m)   Wt 224 lb (101.6 kg)   SpO2 95%   BMI 37.28 kg/m     EXAM: General: Pt appears well and  is in NAD  Neck: General: Supple without adenopathy. Thyroid: No nodules appreciated  Lungs: Clear with good BS bilat   Heart: Auscultation: RRR.  Mental Status: Judgment, insight: Intact Orientation: Oriented to time, place, and person Mood and affect: No depression, anxiety, or agitation     DATA REVIEWED:   Latest Reference Range & Units 12/16/21 08:30  Sodium 135 - 145 mmol/L 140  Potassium 3.5 - 5.1 mmol/L 4.5  Chloride 98 - 111 mmol/L 107  CO2 22 - 32 mmol/L 26  Glucose 70 - 99 mg/dL 107 (H)  BUN 8 - 23 mg/dL 20  Creatinine 0.44 - 1.00 mg/dL 1.00  Calcium 8.9 - 10.3 mg/dL 8.9  Anion gap 5 - 15  7  GFR, Estimated >60 mL/min 57 (L)    Pathology report 06/17/2019 Procedure: Total thyroidectomy  Tumor Focality: Unifocal  Tumor Site: Left thyroid lobe  Tumor Size: 6.0 cm  Histologic Type: Papillary thyroid carcinoma, follicular variant  Margins: Uninvolved by tumor  Angioinvasion: Not identified  Lymphatic Invasion: Not identified  Extrathyroidal extension: Not identified  Regional Lymph Nodes: No lymph nodes submitted or found  Pathologic Stage Classification (pTNM, AJCC 8th Edition): pT3a, pNx  field    WBS 12/09/19  Focus of increased tracer uptake at LEFT thyroid bed consistent with thyroid remnant.   Additional less intense focus of uptake cranial to the more intense focus at the LEFT thyroid bed, question additional thyroid remnant versus regional metastatic lymph node.    Thyroid bed ultrasound 07/03/2020  IMPRESSION: No  residual/recurrent tissue post thyroidectomy.  ASSESSMENT / PLAN / RECOMMENDATIONS:   Papillary thyroid carcinoma ( pT3a, pNx) , Stage II  - S/P RAI ablation 11/2019  -Patient with excellent biochemical and structural response to therapy - TSH goal for  is ~ 0.5-2.0  uIU/mL given advanced age and increased risk of cardiac arrhythmia with low TSH levels - Tg,  Tg Ab pending -No changes today -Will  proceed with thyroid bed ultrasound by next visit   2. Post-Operative Hypothyroidism:   - Pt is clinically euthyroid  -She has been taking levothyroxine 1.5 tabs on Sundays instead of 2 on Sundays and 1 the rest of the week    Medications    levothyroxine 200 mcg ,1.5 tablets on  Sunday and 1 tablet rest of the week      F/u in 6 months     Signed electronically by: Mack Guise, MD  [facility]  [address] [place], [place] [address] Phone: [phone] FAX: [phone]      CC: Elby Showers, MD [address] [id number] Phone: [phone]  Fax: [phone]   Return to Endocrinology clinic as below: Future Appointments  Date Time Provider [place]  11/24/2022  9:40 AM [facility]-LAB [facility]-MJB MJB  11/25/2022  8:20 AM Skeet Latch, MD [facility]  12/01/2022  9:30 AM Baxley, Cresenciano Lick, MD [facility]-MJB MJB  01/26/2023  8:10 AM Euretha Najarro, Melanie Crazier, MD [facility] None  04/27/2023  8:15 AM Trula Slade, DPM [facility]  06/01/2023  9:00 AM MJB-LAB M[facility]-MJB MJB  06/08/2023 10:00 AM Baxley, Cresenciano Lick, MD [facility]-MJB MJB

## 2022-07-21 NOTE — Telephone Encounter (Signed)
LMTCB

## 2022-07-22 MED ORDER — LEVOTHYROXINE SODIUM 200 MCG PO TABS: 300.0000 ug | ORAL_TABLET | ORAL | 3 refills | Status: DC

## 2022-07-22 NOTE — Telephone Encounter (Signed)
Mychart message sent to pt regarding labs

## 2022-07-23 DIAGNOSIS — Z85820 Personal history of malignant melanoma of skin: Secondary | ICD-10-CM | POA: Diagnosis not present

## 2022-07-23 DIAGNOSIS — L821 Other seborrheic keratosis: Secondary | ICD-10-CM | POA: Diagnosis not present

## 2022-07-23 DIAGNOSIS — D225 Melanocytic nevi of trunk: Secondary | ICD-10-CM | POA: Diagnosis not present

## 2022-07-23 DIAGNOSIS — Z86018 Personal history of other benign neoplasm: Secondary | ICD-10-CM | POA: Diagnosis not present

## 2022-07-23 DIAGNOSIS — L570 Actinic keratosis: Secondary | ICD-10-CM | POA: Diagnosis not present

## 2022-07-23 DIAGNOSIS — L578 Other skin changes due to chronic exposure to nonionizing radiation: Secondary | ICD-10-CM | POA: Diagnosis not present

## 2022-07-23 DIAGNOSIS — L814 Other melanin hyperpigmentation: Secondary | ICD-10-CM | POA: Diagnosis not present

## 2022-08-01 LAB — TGAB+THYROGLOBULIN IMA OR LCMS: Thyroglobulin Antibody: 1.1 IU/mL — ABNORMAL HIGH (ref 0.0–0.9)

## 2022-08-01 LAB — THYROGLOBULIN BY LCMS: Thyroglobulin by LCMS: <0.2 ng/mL — ABNORMAL LOW (ref 1.5–38.5)

## 2022-08-21 ENCOUNTER — Ambulatory Visit: Payer: Medicare Other | Admitting: Podiatry

## 2022-08-22 DIAGNOSIS — N200 Calculus of kidney: Secondary | ICD-10-CM | POA: Diagnosis not present

## 2022-09-01 ENCOUNTER — Ambulatory Visit (INDEPENDENT_AMBULATORY_CARE_PROVIDER_SITE_OTHER): Payer: Medicare Other | Admitting: Podiatry

## 2022-09-01 ENCOUNTER — Encounter: Payer: Self-pay | Admitting: Podiatry

## 2022-09-01 DIAGNOSIS — B351 Tinea unguium: Secondary | ICD-10-CM

## 2022-09-01 DIAGNOSIS — Z7901 Long term (current) use of anticoagulants: Secondary | ICD-10-CM | POA: Diagnosis not present

## 2022-09-01 DIAGNOSIS — M79676 Pain in unspecified toe(s): Secondary | ICD-10-CM

## 2022-09-01 DIAGNOSIS — Q828 Other specified congenital malformations of skin: Secondary | ICD-10-CM

## 2022-09-01 NOTE — Progress Notes (Signed)
Patient ID: Cassidy [name], female   DOB: [birth year], 82 y.o.   MRN: [id number] Chief Complaint  Patient presents with   Debridement    Trim toenails/calluses    Cassidy [name], 82 year old female presents the office today for concerns of thick painful toenails and calluses to both of her feet as well as calluses.  No open lesions.  No recent injuries.  She did see orthopedics again and she was placed into a surgical shoe which was helpful.  The symptoms resolved.  She presents today only for nails and calluses.  Objective: AAO 3, NAD DP/PT pulses are palpable, CRT less than 3 seconds  Chronic edema present bilaterally Protective sensation absent with Dorann Ou monofilament Thick hyperkeratotic tissue plantar aspect right hallux with dried blood.  There is no underlying ulceration.  Appears to be stable.  Hyperkeratotic lesion also noted submetatarsal left foot as well as right hallux.  No underlying ulceration. Nails are hypertrophic, dystrophic, brittle, discolored, elongated 9. No surrounding redness or drainage. Tenderness nails 1-5 bilaterally except for left second toe which has been amputated. No area pinpoint tenderness today. No pain with calf compression, swelling, warmth, erythema.  Assessment: 82 year old female with pre-ulcerative calluses bilaterally, symptomatic onychomycosis  Plan: -Treatment options discussed including all alternatives, risks, and complications -Nail sharply debrided 9 without any complications or bleeding -Hyperkeratotic lesion sharply debrided 4 without complications. Monitor for any skin breakdown. Continue offloading. Moisturizer  -Continue compression, elevation. -Follow-up as scheduled or sooner if any problems arise. In the meantime, encouraged to call the office with any questions, concerns, change in symptoms.   Ovid Curd, DPM

## 2022-09-22 ENCOUNTER — Other Ambulatory Visit (INDEPENDENT_AMBULATORY_CARE_PROVIDER_SITE_OTHER): Payer: Medicare Other

## 2022-09-22 DIAGNOSIS — E890 Postprocedural hypothyroidism: Secondary | ICD-10-CM | POA: Diagnosis not present

## 2022-09-22 LAB — TSH: TSH: 42.05 u[IU]/mL — ABNORMAL HIGH (ref 0.35–5.50)

## 2022-09-23 ENCOUNTER — Other Ambulatory Visit: Payer: Self-pay | Admitting: Internal Medicine

## 2022-09-23 DIAGNOSIS — C73 Malignant neoplasm of thyroid gland: Secondary | ICD-10-CM

## 2022-09-23 MED ORDER — LEVOTHYROXINE SODIUM 200 MCG PO TABS: 400.0000 ug | ORAL_TABLET | Freq: Every day | ORAL | 3 refills | Status: DC

## 2022-09-24 DIAGNOSIS — D225 Melanocytic nevi of trunk: Secondary | ICD-10-CM | POA: Diagnosis not present

## 2022-09-24 DIAGNOSIS — L718 Other rosacea: Secondary | ICD-10-CM | POA: Diagnosis not present

## 2022-09-24 DIAGNOSIS — L814 Other melanin hyperpigmentation: Secondary | ICD-10-CM | POA: Diagnosis not present

## 2022-09-24 DIAGNOSIS — Z86006 Personal history of melanoma in-situ: Secondary | ICD-10-CM | POA: Diagnosis not present

## 2022-09-24 DIAGNOSIS — L821 Other seborrheic keratosis: Secondary | ICD-10-CM | POA: Diagnosis not present

## 2022-09-24 DIAGNOSIS — Z08 Encounter for follow-up examination after completed treatment for malignant neoplasm: Secondary | ICD-10-CM | POA: Diagnosis not present

## 2022-11-20 DIAGNOSIS — L718 Other rosacea: Secondary | ICD-10-CM | POA: Diagnosis not present

## 2022-11-24 ENCOUNTER — Other Ambulatory Visit: Payer: Medicare Other

## 2022-11-24 DIAGNOSIS — E785 Hyperlipidemia, unspecified: Secondary | ICD-10-CM

## 2022-11-24 NOTE — Progress Notes (Shared)
Patient Care Team: Margaree Mackintosh, MD as PCP - General (Internal Medicine)  Visit Date: 11/24/22  Subjective:    Patient ID: Cassidy [name] , Female   DOB: [birth year], 82 y.o.    MRN: [id number]   82 y.o. Female presents today for a 6 month lipid check. Not currently taking cholesterol medication.   Past Medical History:  Diagnosis Date   Aortic atherosclerosis (HCC) 02/10/2022   Cancer (HCC)    right foot,top of arm, lympth nodes removed   Degenerative arthritis of left knee    Dependent edema    DVT (deep venous thrombosis) (HCC)    Fatty liver    Heart murmur    occ   High risk surgery, pre-operative cardiovascular examination 11/06/2021   History of kidney stones    HTN (hypertension), benign    Hyperlipidemia    Hypothyroidism    Lymphedema    bi lat leggs   Melanoma (HCC)    Migraine 08/27/2010   Osteopenia    PE (pulmonary embolism) 2010   bilateral hx post op knee replacement   PONV (postoperative nausea and vomiting)    Pure hypercholesterolemia 11/06/2021   Shortness of breath 11/06/2021   on exertion     Family History  Problem Relation Age of Onset   Heart disease Mother    Heart attack Father 18   Heart disease Father    Heart attack Brother    Diabetes Brother    Heart disease Brother    Breast cancer Daughter 55    Social History   Social History Narrative   Social history: Divorced.  Resides alone.  Does not smoke.  Social alcohol consumption.  2 adult children, a son and a daughter.  Daughter with history of breast cancer doing well.  3 granddaughters in good health.       Family history: Father died at age 69 of a sudden MI.  Mother died at age 14 of heart problems.  1 brother deceased in an airplane crash at age 39.  2 sisters in good health.          ROS      Objective:   Vitals: There were no vitals taken for this visit.   Physical Exam    Results:   Studies obtained and personally reviewed by me:  Imaging,  colonoscopy, mammogram, bone density scan, echocardiogram, heart cath, stress test, CT calcium score, etc. ***   Labs:       Component Value Date/Time   NA 142 05/26/2022 0920   NA 142 02/10/2022 1004   K 4.4 05/26/2022 0920   CL 103 05/26/2022 0920   CO2 28 05/26/2022 0920   GLUCOSE 98 05/26/2022 0920   BUN 13 05/26/2022 0920   BUN 14 02/10/2022 1004   CREATININE 1.03 (H) 05/26/2022 0920   CALCIUM 9.4 05/26/2022 0920   PROT 7.3 05/26/2022 0920   PROT 7.3 02/10/2022 1004   ALBUMIN 4.8 (H) 02/10/2022 1004   AST 25 05/26/2022 0920   ALT 19 05/26/2022 0920   ALKPHOS 102 02/10/2022 1004   BILITOT 0.7 05/26/2022 0920   BILITOT 0.6 02/10/2022 1004   GFRNONAA 57 (L) 12/16/2021 0830   GFRNONAA 50 (L) 05/14/2020 1109   GFRAA 58 (L) 05/14/2020 1109     Lab Results  Component Value Date   WBC 6.2 05/26/2022   HGB 14.0 05/26/2022   HCT 42.2 05/26/2022   MCV 90.2 05/26/2022   PLT 219 05/26/2022  Lab Results  Component Value Date   CHOL 197 05/26/2022   HDL 58 05/26/2022   LDLCALC 119 (H) 05/26/2022   TRIG 95 05/26/2022   CHOLHDL 3.4 05/26/2022    Lab Results  Component Value Date   HGBA1C 5.8 (H) 05/26/2022     Lab Results  Component Value Date   TSH 42.05 (H) 09/22/2022     No results found for: "PSA1", "PSA" *** delete for female pts  ***    Assessment & Plan:   ***    I,Alexander Ruley,acting as a scribe for Margaree Mackintosh, MD.,have documented all relevant documentation on the behalf of Margaree Mackintosh, MD,as directed by  Margaree Mackintosh, MD while in the presence of Margaree Mackintosh, MD.   ***

## 2022-11-25 ENCOUNTER — Encounter (HOSPITAL_BASED_OUTPATIENT_CLINIC_OR_DEPARTMENT_OTHER): Payer: Self-pay | Admitting: Cardiovascular Disease

## 2022-11-25 ENCOUNTER — Ambulatory Visit (INDEPENDENT_AMBULATORY_CARE_PROVIDER_SITE_OTHER): Payer: Medicare Other | Admitting: Cardiovascular Disease

## 2022-11-25 VITALS — BP 108/80 | HR 80 | Ht 65.0 in | Wt 216.9 lb

## 2022-11-25 DIAGNOSIS — I700 Atherosclerosis of aorta: Secondary | ICD-10-CM

## 2022-11-25 DIAGNOSIS — I10 Essential (primary) hypertension: Secondary | ICD-10-CM

## 2022-11-25 NOTE — Progress Notes (Signed)
**Note Cassidy-Identified via Obfuscation** Cardiology Office Note:  .    Date:  11/25/2022  ID:  Cleatis [name], DOB 02-17-1941, MRN [id number] PCP: Margaree Mackintosh, MD  [facility] Providers Cardiologist:  None     History of Present Illness: .    Cassidy [name] is a 82 y.o. female with obesity, hypertension, pre-diabetes, papillary thyroid cancer status post thyroidectomy, prior pulmonary embolism, hyperlipidemia, and remote melanoma here for follow-up.  She was first seen 10/2021  for the evaluation of preoperative risk assessment.  She has had recurrent urinary tract infections due to staghorn calculi.  She saw Dr. Lenord Fellers for preoperative risk assessment.  EKG 09/2021 revealed sinus rhythm with LAFB.  She was scheduled to have percutaneous ephrolithotomy with general anesthesia on 11/12/2021.  They have requested that she hold aspirin for 5 days preoperatively.  At that visit she noted that she was not very physically active and got short of breath with walking short distances.  She was referred for [facility] 10/2021 which revealed LVEF 62% and no ischemia.   At her visit 01/2022, she was feeling well and reported successful weight loss with working on diet and exercise. Home blood pressures averaged 133/83. She had a left kidney stone removal and stent placement on 12/24/21 with Dr. Arita Miss. She followed with ortho surg Dr. Susa Simmonds for stress fracture in her foot. She was considering starting PT for gait training due to chronic balance problems. Lipid panel ordered at that visit showed triglycerides 115, HDL 58, LDL 164. Recommended initiation of 20 mg rosuvastatin, but she declined. Repeat lipid panel 05/2022 with triglycerides 95, HDL 58, LDL 119.  Today, she reports that she has been taking red yeast rice for cholesterol management. She is also paying more attention to food labels and monitoring her oral intake. In the past year she has lost 10 lbs. For exercise she goes on walks, usually while using a cart in large stores. If  walking around her neighborhood she supports herself with a cane. She does complain of some back pain. No anginal symptoms. Overall her breathing has been better. However, she may notice some worsening exercise tolerance if she doesn't walk for a week. EKG performed today showed normal sinus rhythm at 71 bpm. She denies any palpitations, chest pain, lightheadedness, headaches, syncope, orthopnea, or PND.  ROS:  Please see the history of present illness. All other systems are reviewed and negative.  (+) Back pain  Studies Reviewed: Marland Kitchen   EKG Interpretation Date/Time:  Tuesday November 25 2022 08:37:43 EDT Ventricular Rate:  71 PR Interval:  168 QRS Duration:  94 QT Interval:  376 QTC Calculation: 408 R Axis:   -39  Text Interpretation: Normal sinus rhythm Left axis deviation Low voltage QRS When compared with ECG of 06-Nov-2011 13:48, No significant change was found Confirmed by Chilton Si ([id number]) on 11/25/2022 8:43:36 AM    Risk Assessment/Calculations:             Physical Exam:    VS:  BP 108/80 (BP Location: Left Arm, Patient Position: Sitting, Cuff Size: Large)   Pulse 80   Ht 5\' 5"  (1.651 m)   Wt 216 lb 14.4 oz (98.4 kg)   BMI 36.09 kg/m  , BMI Body mass index is 36.09 kg/m. GENERAL:  Well appearing HEENT: Pupils equal round and reactive, fundi not visualized, oral mucosa unremarkable NECK:  No jugular venous distention, waveform within normal limits, carotid upstroke brisk and symmetric, no bruits, no thyromegaly LUNGS:  Clear to auscultation  bilaterally HEART:  RRR.  PMI not displaced or sustained,S1 and S2 within normal limits, no S3, no S4, no clicks, no rubs, no murmurs ABD:  Flat, positive bowel sounds normal in frequency in pitch, no bruits, no rebound, no guarding, no midline pulsatile mass, no hepatomegaly, no splenomegaly EXT:  2 plus pulses throughout, +lymphedema, no cyanosis no clubbing SKIN:  No rashes no nodules NEURO:  Cranial nerves II through XII grossly  intact, motor grossly intact throughout PSYCH:  Cognitively intact, oriented to person place and time  Wt Readings from Last 3 Encounters:  11/25/22 216 lb 14.4 oz (98.4 kg)  07/21/22 224 lb (101.6 kg)  05/27/22 224 lb (101.6 kg)     ASSESSMENT AND PLAN: .    # Aortic atherosclerosis:  # Hyperlipidemia Patient has been proactive in managing cholesterol levels through diet and use of red yeast rice supplement. Recent LDL was 119, a significant improvement from previous level of 179. Patient is motivated to continue lifestyle modifications to further reduce cholesterol levels. -Continue current diet and supplement regimen.  She has upcoming labs soon. Reiterated her goal of <70, which will likely require medication as well.  -Request copy of upcoming cholesterol lab results from primary care physician.  # Hypertension: BP well-controlled on amlodipine.    # Weight Management Patient has lost 10 pounds over the past year through dietary changes and increased physical activity. -Continue current diet and exercise regimen, aiming for 150 minutes of walking per week.  # Lymphedema: Patient has developed a personal system for managing leg edema, including use of stockings and padding. -Continue current management strategy. - Continue lasix  Follow-up Plan to follow up in one year, or sooner if any new issues arise.          Dispo:  FU with  C. Duke Salvia, MD, [facility] in 1 year.  I,Mathew Stumpf,acting as a Neurosurgeon for Chilton Si, MD.,have documented all relevant documentation on the behalf of Chilton Si, MD,as directed by  Chilton Si, MD while in the presence of Chilton Si, MD.  I,  C. Duke Salvia, MD have reviewed all documentation for this visit.  The documentation of the exam, diagnosis, procedures, and orders on 11/25/2022 are all accurate and complete.   Signed, Chilton Si, MD

## 2022-11-25 NOTE — Patient Instructions (Signed)
Medication Instructions:  Your physician recommends that you continue on your current medications as directed. Please refer to the Current Medication list given to you today.  *If you need a refill on your cardiac medications before your next appointment, please call your pharmacy*  Follow-Up: At [facility], you and your health needs are our priority.  As part of our continuing mission to provide you with exceptional heart care, we have created designated Provider Care Teams.  These Care Teams include your primary Cardiologist (physician) and Advanced Practice Providers (APPs -  Physician Assistants and Nurse Practitioners) who all work together to provide you with the care you need, when you need it.  We recommend signing up for the patient portal called "MyChart".  Sign up information is provided on this After Visit Summary.  MyChart is used to connect with patients for Virtual Visits (Telemedicine).  Patients are able to view lab/test results, encounter notes, upcoming appointments, etc.  Non-urgent messages can be sent to your provider as well.   To learn more about what you can do with MyChart, go to [email or web address].    Your next appointment:   1 year with Dr. Duke Salvia

## 2022-11-27 ENCOUNTER — Other Ambulatory Visit: Payer: Medicare Other

## 2022-11-27 DIAGNOSIS — E7800 Pure hypercholesterolemia, unspecified: Secondary | ICD-10-CM

## 2022-11-27 NOTE — Progress Notes (Addendum)
Patient Care Team: Margaree Mackintosh, MD as PCP - General (Internal Medicine)  Visit Date: 12/01/22  Subjective:    Patient ID: Cassidy [name] , Female   DOB: 1940-09-17, 82 y.o.    MRN: [id number]   82 y.o. Female presents today for 6 month follow up and lipid check. Patient has a past medical history of aortic atherosclerosis, cancer, dependent edema, deep venous thrombosis, heart murmur, fatty liver, kidney stones, benign hypertension, hypothyroidism, lymphedema, melanoma, migraines, osteopenia, pulmonary embolism, hypercholesterolemia.   HGBA1C at 5.8 on 05/26/22. LDL at 119 on 05/26/22, down from 128 on 05/14/21. Most recent lipid panel 11/2022 showed LDL further decreased to 104, HDL 46, triglycerides 133. She is not interested in using cholesterol medication at this time. She will plan to continue working on increasing her exercise. Went to wedding recently. May have eaten too much she says. She really prefers not to be on a statin. Continue to monitor lipids and will copy Dr. Duke Salvia on these results.  History of hypertension treated with Norvasc 5 mg daily. Her blood pressure is well controlled in the office today at 100/70. She denies any associated symptoms.  History of hypothyroidism treated with Synthroid 200 mcg, two tablets daily for a total of 400 mcg. Her Synthroid had been increased due to abnormal TSH of 42.05 as of 09/22/22. Previously normal at 3.65 on 05/26/22.  History of kidney stones. Prior left kidney stone removal and stent placement on 12/24/21 with Dr. Arita Miss. Kidney function normal. Creatinine at 1.03, BUN at 13 on 05/26/22.    History of dependent edema treated with Lasix 40 mg daily. Wraps ankles and soaks them regularly.   History of arthritis treated with Ultram 50 mg every 6 hours as needed.    DEXA scan last completed 05/28/2004. Results showed osteoporosis.   Negative Cologuard 06/05/21.   Mammogram last completed 01/18/18. Results show probably benign asymmetry within  the posterior left breast, not significantly changed in the interval, again most likely postsurgical change related to previous reduction mammoplasty. Recommended bilateral diagnostic mammogram in 6 months.  Past Medical History:  Diagnosis Date   Aortic atherosclerosis (HCC) 02/10/2022   Cancer (HCC)    right foot,top of arm, lympth nodes removed   Degenerative arthritis of left knee    Dependent edema    DVT (deep venous thrombosis) (HCC)    Fatty liver    Heart murmur    occ   High risk surgery, pre-operative cardiovascular examination 11/06/2021   History of kidney stones    HTN (hypertension), benign    Hyperlipidemia    Hypothyroidism    Lymphedema    bi lat leggs   Melanoma (HCC)    Migraine 08/27/2010   Osteopenia    PE (pulmonary embolism) 2010   bilateral hx post op knee replacement   PONV (postoperative nausea and vomiting)    Pure hypercholesterolemia 11/06/2021   Shortness of breath 11/06/2021   on exertion     Family History  Problem Relation Age of Onset   Heart disease Mother    Heart attack Father 60   Heart disease Father    Heart attack Brother    Diabetes Brother    Heart disease Brother    Breast cancer Daughter 57    Social History   Social History Narrative   Social history: Divorced.  Resides alone.  Does not smoke.  Social alcohol consumption.  2 adult children, a son and a daughter.  Daughter with history of  breast cancer doing well.  3 granddaughters in good health.       Family history: Father died at age 40 of a sudden MI.  Mother died at age 61 of heart problems.  1 brother deceased in an airplane crash at age 8.  2 sisters in good health.          Review of Systems  Constitutional:  Negative for fever and malaise/fatigue.  HENT:  Negative for congestion.   Eyes:  Negative for blurred vision.  Respiratory:  Negative for cough and shortness of breath.   Cardiovascular:  Negative for chest pain, palpitations and leg swelling.   Gastrointestinal:  Negative for vomiting.  Musculoskeletal:  Negative for back pain.  Skin:  Negative for rash.  Neurological:  Negative for loss of consciousness and headaches.        Objective:   Vitals: BP 100/70   Pulse (!) 102   Ht 5\' 5"  (1.651 m)   Wt 217 lb (98.4 kg)   SpO2 97%   BMI 36.11 kg/m    Physical Exam Vitals and nursing note reviewed.  Constitutional:      General: She is not in acute distress.    Appearance: Normal appearance. She is not toxic-appearing.  HENT:     Head: Normocephalic and atraumatic.  Neck:     Vascular: No carotid bruit.  Cardiovascular:     Rate and Rhythm: Normal rate and regular rhythm.     Pulses: Normal pulses.     Heart sounds: Normal heart sounds. No murmur heard.    No friction rub. No gallop.     Comments: No ectopy. Pulmonary:     Effort: Pulmonary effort is normal. No respiratory distress.     Breath sounds: Normal breath sounds. No wheezing or rales.  Skin:    General: Skin is warm and dry.  Neurological:     Mental Status: She is alert and oriented to person, place, and time. Mental status is at baseline.  Psychiatric:        Mood and Affect: Mood normal.        Behavior: Behavior normal.        Thought Content: Thought content normal.        Judgment: Judgment normal.       Results:   Studies obtained and personally reviewed by me:  Dexa Scan last completed 05/28/2004. Results showed osteoporosis.   Negative Cologuard 06/05/21.   Mammogram last completed 01/18/18. Results show probably benign asymmetry within the posterior LEFT breast, not significantly changed in the interval, again most likely postsurgical change related to previous reduction mammoplasty. Recommended bilateral diagnostic mammogram in 6 months.   Labs:       Component Value Date/Time   NA 142 05/26/2022 0920   NA 142 02/10/2022 1004   K 4.4 05/26/2022 0920   CL 103 05/26/2022 0920   CO2 28 05/26/2022 0920   GLUCOSE 98 05/26/2022  0920   BUN 13 05/26/2022 0920   BUN 14 02/10/2022 1004   CREATININE 1.03 (H) 05/26/2022 0920   CALCIUM 9.4 05/26/2022 0920   PROT 7.3 05/26/2022 0920   PROT 7.3 02/10/2022 1004   ALBUMIN 4.8 (H) 02/10/2022 1004   AST 25 05/26/2022 0920   ALT 19 05/26/2022 0920   ALKPHOS 102 02/10/2022 1004   BILITOT 0.7 05/26/2022 0920   BILITOT 0.6 02/10/2022 1004   GFRNONAA 57 (L) 12/16/2021 0830   GFRNONAA 50 (L) 05/14/2020 1109   GFRAA 58 (L) 05/14/2020  1109     Lab Results  Component Value Date   WBC 6.2 05/26/2022   HGB 14.0 05/26/2022   HCT 42.2 05/26/2022   MCV 90.2 05/26/2022   PLT 219 05/26/2022    Lab Results  Component Value Date   CHOL 173 11/27/2022   HDL 46 (L) 11/27/2022   LDLCALC 104 (H) 11/27/2022   TRIG 133 11/27/2022   CHOLHDL 3.8 11/27/2022    Lab Results  Component Value Date   HGBA1C 5.8 (H) 05/26/2022     Lab Results  Component Value Date   TSH 42.05 (H) 09/22/2022       Assessment & Plan:   Hypercholesterolemia: Her LDL has been steadily decreasing (128 as of 05/14/21, 119 as of 05/26/2022). Most recent lipid panel 11/2022 showed LDL further decreased to 104, HDL 46, triglycerides 133.   She is not interested in using cholesterol medication at this time.   Recently seen by Dr. Duke Salvia in Cardiology who requests copy of lipid results.  Hypothyroidism:  TSH was abnormal at 42.05 as of 09/22/22 (prior 3.65 on 05/26/2022). Her Synthroid had been increased to two tablets of 200 mcg daily by Dr. Lonzo Cloud  Kidney Stones: Prior left kidney stone removal and stent placement on 12/24/21 with Dr. Arita Miss. Kidney function normal. Creatinine at 1.03, BUN at 13 on 05/26/22.    Dependent Edema: Well-controlled with Lasix 40 mg daily. Wraps ankles and soaks them regularly.   Hypertension: Well-controlled with Norvasc 5 mg daily. Blood pressure is 100/70 today but she is asymptomatic.    Arthritis: Well-controlled with Ultram 50 mg every 6 hours as needed.   DEXA Scan: Last  completed 05/28/2004. Results showed osteoporosis.   Mammogram: Last completed 01/18/18. Results show probably benign asymmetry within the posterior left breast, not significantly changed in the interval, again most likely postsurgical change related to previous reduction mammoplasty. Recommended bilateral diagnostic mammogram in 6 months.   Colon Screening: Negative Cologuard 06/05/21. Advised to use Hemoccult tests in future.   Vision:  She has just scheduled a follow-up with her ophthalmologist for 01/15/23. She has had one cataract removed and will need the other removed as well sometime in the near future.  Vaccine Counseling:  Advised to receive flu, tetanus, shingles at pharmacy. She declines the Covid-19 vaccination. Pneumococcal 20 vaccine was administered at her appointment 05/27/2022.   Plan: We will send  lipid panel to Dr. Duke Salvia. LDL has been improving; however, her HDL has decreased somewhat. Recommended increasing her physical exercise. Return for follow-up in 6 months at which time she will be due for Medicare wellness visit.   I,Mathew Stumpf,acting as a Neurosurgeon for Margaree Mackintosh, MD.,have documented all relevant documentation on the behalf of Margaree Mackintosh, MD,as directed by  Margaree Mackintosh, MD while in the presence of Margaree Mackintosh, MD.   I, Margaree Mackintosh, MD, have reviewed all documentation for this visit. The documentation on 12/01/22 for the exam, diagnosis, procedures, and orders are all accurate and complete.

## 2022-12-01 ENCOUNTER — Encounter: Payer: Self-pay | Admitting: Internal Medicine

## 2022-12-01 ENCOUNTER — Ambulatory Visit (INDEPENDENT_AMBULATORY_CARE_PROVIDER_SITE_OTHER): Payer: Medicare Other | Admitting: Internal Medicine

## 2022-12-01 ENCOUNTER — Ambulatory Visit: Payer: Medicare Other | Admitting: Podiatry

## 2022-12-01 VITALS — BP 100/70 | HR 102 | Ht 65.0 in | Wt 217.0 lb

## 2022-12-01 DIAGNOSIS — B351 Tinea unguium: Secondary | ICD-10-CM

## 2022-12-01 DIAGNOSIS — Z8659 Personal history of other mental and behavioral disorders: Secondary | ICD-10-CM | POA: Diagnosis not present

## 2022-12-01 DIAGNOSIS — I10 Essential (primary) hypertension: Secondary | ICD-10-CM

## 2022-12-01 DIAGNOSIS — Q828 Other specified congenital malformations of skin: Secondary | ICD-10-CM | POA: Diagnosis not present

## 2022-12-01 DIAGNOSIS — Z7901 Long term (current) use of anticoagulants: Secondary | ICD-10-CM

## 2022-12-01 DIAGNOSIS — R609 Edema, unspecified: Secondary | ICD-10-CM

## 2022-12-01 DIAGNOSIS — E119 Type 2 diabetes mellitus without complications: Secondary | ICD-10-CM

## 2022-12-01 DIAGNOSIS — Z85820 Personal history of malignant melanoma of skin: Secondary | ICD-10-CM | POA: Diagnosis not present

## 2022-12-01 DIAGNOSIS — M79676 Pain in unspecified toe(s): Secondary | ICD-10-CM | POA: Diagnosis not present

## 2022-12-01 DIAGNOSIS — E039 Hypothyroidism, unspecified: Secondary | ICD-10-CM

## 2022-12-01 NOTE — Progress Notes (Signed)
Patient ID: Cassidy [name], female   DOB: 1940/10/15, 82 y.o.   MRN: [id number] Chief Complaint  Patient presents with   Callouses    BILAT   RFC    RFC    Cassidy [name], 82 year old female presents the office today for concerns of thick painful toenails and calluses to both of her feet as well as calluses.  No open lesions. She follows with Dr. Susa Simmonds for other foot issues.   Objective: AAO 3, NAD DP/PT pulses are palpable, CRT less than 3 seconds  Chronic edema present bilaterally Protective sensation absent with Dorann Ou monofilament Thick hyperkeratotic tissue plantar aspect right hallux with dried blood.  There is no underlying ulceration.  Hyperkeratotic lesion also noted submetatarsal left foot as well as right hallux.  No underlying ulceration. Nails are hypertrophic, dystrophic, brittle, discolored, elongated 9. No surrounding redness or drainage. Tenderness nails 1-5 bilaterally except for left second toe which has been amputated. No area pinpoint tenderness today. Compression wraps on the legs so the exam is limited to the feet.  No pain with calf compression, swelling, warmth, erythema.  Assessment: 82 year old female with pre-ulcerative calluses bilaterally, symptomatic onychomycosis  Plan: -Treatment options discussed including all alternatives, risks, and complications -Nail sharply debrided 9 without any complications or bleeding -Hyperkeratotic lesion sharply debrided  4 without complications. Monitor for any skin breakdown. Continue offloading. Moisturizer  -Continue compression, elevation. -Follow-up as scheduled or sooner if any problems arise. In the meantime, encouraged to call the office with any questions, concerns, change in symptoms.   Ovid Curd, DPM

## 2022-12-01 NOTE — Patient Instructions (Signed)
It was a pleasure to see you today. Please watch diet. Will forward lipid results to Dr. Duke Salvia. CPE and medicare wellness visit due in 6 months.

## 2023-01-15 DIAGNOSIS — H52203 Unspecified astigmatism, bilateral: Secondary | ICD-10-CM | POA: Diagnosis not present

## 2023-01-15 DIAGNOSIS — H2511 Age-related nuclear cataract, right eye: Secondary | ICD-10-CM | POA: Diagnosis not present

## 2023-01-20 DIAGNOSIS — E039 Hypothyroidism, unspecified: Secondary | ICD-10-CM | POA: Diagnosis not present

## 2023-01-20 DIAGNOSIS — R194 Change in bowel habit: Secondary | ICD-10-CM | POA: Diagnosis not present

## 2023-01-20 DIAGNOSIS — R140 Abdominal distension (gaseous): Secondary | ICD-10-CM | POA: Diagnosis not present

## 2023-01-20 DIAGNOSIS — I10 Essential (primary) hypertension: Secondary | ICD-10-CM | POA: Diagnosis not present

## 2023-01-26 ENCOUNTER — Ambulatory Visit (INDEPENDENT_AMBULATORY_CARE_PROVIDER_SITE_OTHER): Payer: Medicare Other | Admitting: Internal Medicine

## 2023-01-26 ENCOUNTER — Encounter: Payer: Self-pay | Admitting: Internal Medicine

## 2023-01-26 VITALS — BP 118/72 | HR 110 | Ht 65.0 in | Wt 216.0 lb

## 2023-01-26 DIAGNOSIS — Z85850 Personal history of malignant neoplasm of thyroid: Secondary | ICD-10-CM | POA: Insufficient documentation

## 2023-01-26 DIAGNOSIS — C73 Malignant neoplasm of thyroid gland: Secondary | ICD-10-CM | POA: Diagnosis not present

## 2023-01-26 DIAGNOSIS — E890 Postprocedural hypothyroidism: Secondary | ICD-10-CM

## 2023-01-26 LAB — TSH: TSH: <0.01 u[IU]/mL — ABNORMAL LOW (ref 0.35–5.50)

## 2023-01-26 NOTE — Patient Instructions (Signed)

## 2023-01-26 NOTE — Progress Notes (Unsigned)
Name: Cassidy [name]  MRN/ DOB: [id number], Aug 29, 1940    Age/ Sex: 82 y.o., female     PCP: Margaree Mackintosh, MD   Reason for Endocrinology Evaluation: [facility]     Initial Endocrinology Clinic Visit: 04/05/2020    [name] IDENTIFIER: [name] is a 82 y.o., female with a past medical history of HTN, Hx of DVT's and melanoma . She has followed with  Endocrinology clinic since 04/05/2020  for consultative assistance with management of her MNG.      HISTORICAL SUMMARY:  Pt was noted to have a tracheal deviation on CXR during pre-op evaluation. A CT scan showed a left thyroid necrotic mass that measures ~ 6.5 cm. 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   Afirma came back benign for Cassidy 2.6 cm nodule but no afirma results  for Cassidy 5.9 nodule due to low follicular content.  She is S/P total thyroidectomy on 06/17/2019 with a pathology report of 6.0 cm PTC   Of note she has been on LT- 4 replacement for years   A Thyrogen WBS 09/2019 showed Focal activity in thyroid bed consistent with remnant thyroid tissue and potential metastatic lymph node. Follow up thyroid bed ultrasound was unrevealing.    She is S/P RAI 53.5 mCi I-131  On 11/30/2019  Post-Op WBS scan demonstrated a Focus of increased tracer uptake at LEFT thyroid bed consistent with thyroid remnant and a  less intense focus of uptake cranial to Cassidy more intense focus at Cassidy LEFT thyroid bed, question additional thyroid remnant versus regional metastatic lymph node.    SUBJECTIVE:    Today (01/26/2023):  Cassidy [name] is here for a follow up on total thyroidectomy and a PTC diagnosis.    She has been noted with weight loss  Has noted  occasional palpitations  Has loose stools , saw Dr. Loreta Ave increased fiber intake  Denies tremors  No local neck symptoms  NO Biotin   Levothyroxine 200 mcg,2 tabs daily      HISTORY:  Past Medical History:  Past Medical History:  Diagnosis Date   Aortic atherosclerosis (HCC) 02/10/2022   Cancer (HCC)    right foot,top of arm, lympth nodes removed   Degenerative arthritis of left knee    Dependent edema    DVT (deep venous thrombosis) (HCC)    Fatty liver    Heart murmur    occ   High risk surgery, pre-operative cardiovascular examination 11/06/2021   History of kidney stones    HTN (hypertension), benign    Hyperlipidemia    Hypothyroidism    Lymphedema    bi lat leggs   Melanoma (HCC)    Migraine 08/27/2010   Osteopenia    PE (pulmonary embolism) 2010   bilateral hx post op knee replacement   PONV (postoperative nausea and vomiting)    Pure hypercholesterolemia 11/06/2021   Shortness of breath 11/06/2021   on exertion   Past Surgical History:  Past Surgical History:  Procedure Laterality Date   ABDOMINAL HYSTERECTOMY     age 77   AMPUTATION  11/10/2011   Procedure: AMPUTATION RAY;  Surgeon: Nestor Lewandowsky, MD;  Location: [facility];  Service: Orthopedics;  Laterality: Left;  LEFT 2ND TOE TRANS  MIDDLE PHALANX AMPUTATION    ANKLE FUSION  2005   lt-fusion   CHOLECYSTECTOMY  02/14/2002   COLONOSCOPY     CYSTOSCOPY WITH RETROGRADE PYELOGRAM, URETEROSCOPY AND STENT PLACEMENT Left 12/24/2021   Procedure: CYSTOSCOPY WITH RETROGRADE PYELOGRAM, URETEROSCOPY AND STENT REPLACEMENT;  Surgeon: Noel Christmas, MD;  Location: [facility];  Service: Urology;  Laterality: Left;   GANGLION CYST EXCISION  2008   Rt wrist   HOLMIUM LASER APPLICATION Left 12/24/2021   Procedure: HOLMIUM LASER APPLICATION;  Surgeon: Noel Christmas, MD;  Location: [facility];  Service: Urology;  Laterality: Left;   IR NEPHROSTOGRAM LEFT THRU EXISTING ACCESS  11/14/2021   IR URETERAL STENT  PLACEMENT EXISTING ACCESS LEFT  11/12/2021   JOINT REPLACEMENT  2010   rt total knee   KNEE ARTHROSCOPY Right 09/2001   Rt knee   NEPHROLITHOTOMY Left 11/12/2021   Procedure: NEPHROLITHOTOMY PERCUTANEOUS;  Surgeon: Noel Christmas, MD;  Location: [facility];  Service: Urology;  Laterality: Left;  3 HRS   OVARIAN CYST SURGERY Left    age 86 and appendectomy   RECTOCELE REPAIR  2009   REDUCTION MAMMAPLASTY Bilateral    THYROIDECTOMY N/A 06/17/2019   Procedure: TOTAL THYROIDECTOMY;  Surgeon: Darnell Level, MD;  Location: [facility];  Service: General;  Laterality: N/A;   TOTAL KNEE ARTHROPLASTY Left 12/06/2018   Procedure: Left Knee Arthroplasty;  Surgeon: Gean Birchwood, MD;  Location: [facility];  Service: Orthopedics;  Laterality: Left;   UPPER GI ENDOSCOPY     Social History:  reports that she has never smoked. She has never used smokeless tobacco. She reports that she does not drink alcohol and does not use drugs. Family History:  Family History  Problem Relation Age of Onset   Heart disease Mother    Heart attack Father 82   Heart disease Father    Heart attack Brother    Diabetes Brother    Heart disease Brother    Breast cancer Daughter 74     HOME MEDICATIONS: Allergies as of 01/26/2023   No Known Allergies      Medication List        Accurate as of January 26, 2023  8:18 AM. If you have any questions, ask your nurse or doctor.          acetaminophen 325 MG tablet Commonly known as: TYLENOL Take 650 mg by mouth every 6 (six) hours as needed for moderate pain or headache.   amLODipine 5 MG tablet Commonly known as: NORVASC TAKE ONE TABLET EVERY DAY   co-enzyme Q-10 30 MG capsule Take 30 mg by mouth 3 (three) times daily.   furosemide 40 MG tablet Commonly known as: LASIX TAKE ONE TABLET DAILY   Garlic 10 MG Caps Take by mouth.   HYDROcodone-acetaminophen 5-325 MG tablet Commonly known as: NORCO/VICODIN Take 1 tablet by mouth every 4 (four) hours as needed.    levothyroxine 200 MCG tablet Commonly known as: SYNTHROID Take 2 tablets (400 mcg total) by mouth daily before breakfast.   promethazine 25 MG tablet Commonly known as: PHENERGAN Take 1 tablet (25 mg total) by mouth every 8 (eight) hours as needed for nausea or vomiting.   Red Yeast Rice 55 MG Caps Take by mouth.          OBJECTIVE:   PHYSICAL EXAM: VS: BP 118/72 (BP Location: Left Arm, [name] Position: Sitting, Cuff Size: Large)   Pulse (!) 110   Ht 5\' 5"  (1.651 m)   Wt  216 lb (98 kg)   SpO2 96%   BMI 35.94 kg/m     EXAM: General: Pt appears well and is in NAD  Neck: General: Supple without adenopathy. Thyroid: No nodules appreciated  Lungs: Clear with good BS bilat   Heart: Auscultation: RRR.  Mental Status: Judgment, insight: Intact Orientation: Oriented to time, place, and person Mood and affect: No depression, anxiety, or agitation     DATA REVIEWED:   Latest Reference Range & Units 07/21/22 08:10  TSH 0.35 - 5.50 uIU/mL 31.79 (H)      Latest Reference Range & Units 12/16/21 08:30  Sodium 135 - 145 mmol/L 140  Potassium 3.5 - 5.1 mmol/L 4.5  Chloride 98 - 111 mmol/L 107  CO2 22 - 32 mmol/L 26  Glucose 70 - 99 mg/dL 161 (H)  BUN 8 - 23 mg/dL 20  Creatinine 0.96 - 0.45 mg/dL 4.09  Calcium 8.9 - 81.1 mg/dL 8.9  Anion gap 5 - 15  7  GFR, Estimated >60 mL/min 57 (L)    Pathology report 06/17/2019 Procedure: Total thyroidectomy  Tumor Focality: Unifocal  Tumor Site: Left thyroid lobe  Tumor Size: 6.0 cm  Histologic Type: Papillary thyroid carcinoma, follicular variant  Margins: Uninvolved by tumor  Angioinvasion: Not identified  Lymphatic Invasion: Not identified  Extrathyroidal extension: Not identified  Regional Lymph Nodes: No lymph nodes submitted or found  Pathologic Stage Classification (pTNM, AJCC 8th Edition): pT3a, pNx  field    WBS 12/09/19  Focus of increased tracer uptake at LEFT thyroid bed consistent with thyroid remnant.    Additional less intense focus of uptake cranial to Cassidy more intense focus at Cassidy LEFT thyroid bed, question additional thyroid remnant versus regional metastatic lymph node.    Thyroid bed ultrasound 01/24/2022   FINDINGS: Cassidy thyroid gland is surgically absent. Evaluation of Cassidy thyroid resection bed demonstrates no evidence of residual or recurrent thyroid tissue. No nodularity or lymphadenopathy.   IMPRESSION: Surgical changes of total thyroidectomy without evidence of residual or recurrent thyroid tissue.    ASSESSMENT / PLAN / RECOMMENDATIONS:   Papillary thyroid carcinoma ( pT3a, pNx) , Stage II  - S/P RAI ablation 11/2019  -[name] with excellent biochemical and structural response to therapy - TSH goal for  is ~ 0.5-2.0  uIU/mL given advanced age and increased risk of cardiac arrhythmia with low TSH levels - Tg,  Tg Ab pending -Will  proceed with thyroid bed ultrasound by next visit   2. Post-Operative Hypothyroidism:   - Pt is clinically euthyroid  -She has been taking levothyroxine 1.5 tabs on Sundays instead of 2 on Sundays and 1 Cassidy rest of Cassidy week -Interestingly enough, her TSH has increased from 3.65 to 31.79 uIU/ml over Cassidy past 2 months.  -Cassidy [name] assures me compliance and proper intake of levothyroxine -I will increase levothyroxine as below, and she will return in 2 months for repeat TSH    Medications   Increase Levothyroxine 200 mcg ,2 tablets daily      F/u in 6 months   Addendum: Spoke to Cassidy [name] on 07/22/2022 at 1445 and discussed Cassidy labs and increase levothyroxine  Signed electronically by: Lyndle Herrlich, MD  [facility] Endocrinology  [facility] [address] [place] [place], [place] [address] Phone: [phone] FAX: [phone]      CC: Margaree Mackintosh, MD [address] [place] [address] Phone: [phone]  Fax: [phone]   Return to Endocrinology clinic as below: Future  Appointments  Date Time Provider Department Center  02/02/2023  8:15 AM Vivi Barrack, DPM [facility]  04/27/2023  8:15 AM Vivi Barrack, DPM [facility]  06/01/2023  9:00 AM [facility]  06/08/2023 10:00 AM Baxley, Luanna Cole, MD [facility]

## 2023-01-27 ENCOUNTER — Telehealth: Payer: Self-pay | Admitting: Internal Medicine

## 2023-01-27 DIAGNOSIS — M5441 Lumbago with sciatica, right side: Secondary | ICD-10-CM | POA: Diagnosis not present

## 2023-01-27 DIAGNOSIS — M545 Low back pain, unspecified: Secondary | ICD-10-CM | POA: Diagnosis not present

## 2023-01-27 NOTE — Telephone Encounter (Signed)
Patient aware and mychart has been sent

## 2023-01-27 NOTE — Telephone Encounter (Signed)
Please let the patient know that she is now on too much levothyroxine.  My suggestion is to continue to take 2 tablets Monday Saturday and Sunday of levothyroxine   And decreased to 1 tablet Monday, Tuesday, Wednesday, and Thursday   She is already scheduled for repeat labs in 2 months   Thanks

## 2023-01-28 LAB — THYROGLOBULIN BY IMA

## 2023-01-28 LAB — T4, FREE: Free T4: 2.23 ng/dL — ABNORMAL HIGH (ref 0.82–1.77)

## 2023-01-28 LAB — TSH: TSH: 0.008 u[IU]/mL — ABNORMAL LOW (ref 0.450–4.500)

## 2023-01-28 LAB — TGAB+THYROGLOBULIN IMA OR LCMS: Thyroglobulin Antibody: <1 [IU]/mL (ref 0.0–0.9)

## 2023-02-02 ENCOUNTER — Ambulatory Visit: Payer: Medicare Other | Admitting: Podiatry

## 2023-02-02 ENCOUNTER — Other Ambulatory Visit: Payer: Medicare Other

## 2023-02-04 DIAGNOSIS — M47896 Other spondylosis, lumbar region: Secondary | ICD-10-CM | POA: Diagnosis not present

## 2023-02-05 ENCOUNTER — Ambulatory Visit
Admission: RE | Admit: 2023-02-05 | Discharge: 2023-02-05 | Disposition: A | Discharge status: Home / Self Care | Payer: Medicare Other | Source: Ambulatory Visit | Attending: Internal Medicine | Admitting: Internal Medicine

## 2023-02-05 DIAGNOSIS — Z85850 Personal history of malignant neoplasm of thyroid: Secondary | ICD-10-CM | POA: Diagnosis not present

## 2023-02-05 DIAGNOSIS — E890 Postprocedural hypothyroidism: Secondary | ICD-10-CM

## 2023-02-11 DIAGNOSIS — M47896 Other spondylosis, lumbar region: Secondary | ICD-10-CM | POA: Diagnosis not present

## 2023-02-11 DIAGNOSIS — N200 Calculus of kidney: Secondary | ICD-10-CM | POA: Diagnosis not present

## 2023-02-13 DIAGNOSIS — M47896 Other spondylosis, lumbar region: Secondary | ICD-10-CM | POA: Diagnosis not present

## 2023-02-16 DIAGNOSIS — M47896 Other spondylosis, lumbar region: Secondary | ICD-10-CM | POA: Diagnosis not present

## 2023-02-17 DIAGNOSIS — M79671 Pain in right foot: Secondary | ICD-10-CM | POA: Diagnosis not present

## 2023-02-17 DIAGNOSIS — M47896 Other spondylosis, lumbar region: Secondary | ICD-10-CM | POA: Diagnosis not present

## 2023-02-18 ENCOUNTER — Telehealth: Payer: Self-pay

## 2023-02-18 NOTE — Telephone Encounter (Signed)
DTVM left with instructions for the Levothyroxine

## 2023-02-24 DIAGNOSIS — M47896 Other spondylosis, lumbar region: Secondary | ICD-10-CM | POA: Diagnosis not present

## 2023-02-26 DIAGNOSIS — M47896 Other spondylosis, lumbar region: Secondary | ICD-10-CM | POA: Diagnosis not present

## 2023-03-03 DIAGNOSIS — R2681 Unsteadiness on feet: Secondary | ICD-10-CM | POA: Diagnosis not present

## 2023-03-03 DIAGNOSIS — R531 Weakness: Secondary | ICD-10-CM | POA: Diagnosis not present

## 2023-03-06 ENCOUNTER — Ambulatory Visit: Payer: Medicare Other | Admitting: Podiatry

## 2023-03-10 DIAGNOSIS — R2681 Unsteadiness on feet: Secondary | ICD-10-CM | POA: Diagnosis not present

## 2023-03-10 DIAGNOSIS — R531 Weakness: Secondary | ICD-10-CM | POA: Diagnosis not present

## 2023-03-12 DIAGNOSIS — R531 Weakness: Secondary | ICD-10-CM | POA: Diagnosis not present

## 2023-03-12 DIAGNOSIS — R2681 Unsteadiness on feet: Secondary | ICD-10-CM | POA: Diagnosis not present

## 2023-03-18 DIAGNOSIS — R2681 Unsteadiness on feet: Secondary | ICD-10-CM | POA: Diagnosis not present

## 2023-03-18 DIAGNOSIS — R531 Weakness: Secondary | ICD-10-CM | POA: Diagnosis not present

## 2023-03-24 DIAGNOSIS — R531 Weakness: Secondary | ICD-10-CM | POA: Diagnosis not present

## 2023-03-24 DIAGNOSIS — R2681 Unsteadiness on feet: Secondary | ICD-10-CM | POA: Diagnosis not present

## 2023-03-26 DIAGNOSIS — R2681 Unsteadiness on feet: Secondary | ICD-10-CM | POA: Diagnosis not present

## 2023-03-26 DIAGNOSIS — R531 Weakness: Secondary | ICD-10-CM | POA: Diagnosis not present

## 2023-03-30 DIAGNOSIS — L814 Other melanin hyperpigmentation: Secondary | ICD-10-CM | POA: Diagnosis not present

## 2023-03-30 DIAGNOSIS — Z08 Encounter for follow-up examination after completed treatment for malignant neoplasm: Secondary | ICD-10-CM | POA: Diagnosis not present

## 2023-03-30 DIAGNOSIS — L821 Other seborrheic keratosis: Secondary | ICD-10-CM | POA: Diagnosis not present

## 2023-03-30 DIAGNOSIS — D225 Melanocytic nevi of trunk: Secondary | ICD-10-CM | POA: Diagnosis not present

## 2023-03-30 DIAGNOSIS — Z86006 Personal history of melanoma in-situ: Secondary | ICD-10-CM | POA: Diagnosis not present

## 2023-03-31 DIAGNOSIS — R2681 Unsteadiness on feet: Secondary | ICD-10-CM | POA: Diagnosis not present

## 2023-03-31 DIAGNOSIS — R531 Weakness: Secondary | ICD-10-CM | POA: Diagnosis not present

## 2023-04-01 ENCOUNTER — Telehealth: Payer: Self-pay

## 2023-04-01 ENCOUNTER — Other Ambulatory Visit: Payer: Medicare Other

## 2023-04-01 ENCOUNTER — Other Ambulatory Visit: Payer: Self-pay | Admitting: Internal Medicine

## 2023-04-01 DIAGNOSIS — E890 Postprocedural hypothyroidism: Secondary | ICD-10-CM

## 2023-04-01 NOTE — Telephone Encounter (Signed)
Place orders for upcoming appointment  04/08/23

## 2023-04-08 ENCOUNTER — Other Ambulatory Visit: Payer: Medicare Other

## 2023-04-09 ENCOUNTER — Other Ambulatory Visit: Payer: Medicare Other

## 2023-04-09 DIAGNOSIS — E890 Postprocedural hypothyroidism: Secondary | ICD-10-CM | POA: Diagnosis not present

## 2023-04-10 LAB — TSH: TSH: 1.04 m[IU]/L (ref 0.40–4.50)

## 2023-04-10 LAB — T4, FREE: Free T4: 1.9 ng/dL — ABNORMAL HIGH (ref 0.8–1.8)

## 2023-04-27 ENCOUNTER — Ambulatory Visit (INDEPENDENT_AMBULATORY_CARE_PROVIDER_SITE_OTHER): Payer: Medicare Other | Admitting: Podiatry

## 2023-04-27 ENCOUNTER — Encounter: Payer: Self-pay | Admitting: Podiatry

## 2023-04-27 DIAGNOSIS — R262 Difficulty in walking, not elsewhere classified: Secondary | ICD-10-CM | POA: Diagnosis not present

## 2023-04-27 DIAGNOSIS — M79676 Pain in unspecified toe(s): Secondary | ICD-10-CM | POA: Diagnosis not present

## 2023-04-27 DIAGNOSIS — R531 Weakness: Secondary | ICD-10-CM | POA: Diagnosis not present

## 2023-04-27 DIAGNOSIS — Q828 Other specified congenital malformations of skin: Secondary | ICD-10-CM

## 2023-04-27 DIAGNOSIS — Z7901 Long term (current) use of anticoagulants: Secondary | ICD-10-CM | POA: Diagnosis not present

## 2023-04-27 DIAGNOSIS — B351 Tinea unguium: Secondary | ICD-10-CM | POA: Diagnosis not present

## 2023-04-27 DIAGNOSIS — R2681 Unsteadiness on feet: Secondary | ICD-10-CM | POA: Diagnosis not present

## 2023-04-27 NOTE — Progress Notes (Signed)
 Patient ID: Cassidy [name]  R Bracy, female   DOB: 1941-04-04, 83 y.o.   MRN: [id number] Chief Complaint  Patient presents with   RFC    RM#11 RFC patient has no concerns today.    Ms. Cancio, 83 year old female presents the office today for concerns of thick painful toenails and calluses to both of her feet as well as calluses.  She tries to keep a bandage on the right big toe for padding.  No open lesions. No other concerns today.   Objective: AAO 3, NAD DP/PT pulses are palpable, CRT less than 3 seconds  Chronic edema present bilaterally Protective sensation absent with Sallye Speed monofilament Thick hyperkeratotic tissue plantar aspect right hallux with dried blood, left medial 1st metatarsal head. There is no underlying ulceration.  No open lesions Nails are hypertrophic, dystrophic, brittle, discolored, elongated 9. No surrounding redness or drainage. Tenderness nails 1-5 bilaterally except for left second toe which has been amputated. No area pinpoint tenderness today. Compression wraps on the legs so the exam is limited to the feet.  No pain with calf compression, swelling, warmth, erythema.  Assessment: 83 year old female with pre-ulcerative calluses bilaterally, symptomatic onychomycosis  Plan: -Treatment options discussed including all alternatives, risks, and complications -Nail sharply debrided 9 without any complications or bleeding -Hyperkeratotic lesion sharply debrided  3 without complications. Monitor for any skin breakdown. Continue offloading. Moisturizer  -Continue compression, elevation. -Follow-up as scheduled or sooner if any problems arise. In the meantime, encouraged to call the office with any questions, concerns, change in symptoms.   Donnice Fees, DPM

## 2023-05-04 DIAGNOSIS — R2681 Unsteadiness on feet: Secondary | ICD-10-CM | POA: Diagnosis not present

## 2023-05-04 DIAGNOSIS — R531 Weakness: Secondary | ICD-10-CM | POA: Diagnosis not present

## 2023-05-04 DIAGNOSIS — R262 Difficulty in walking, not elsewhere classified: Secondary | ICD-10-CM | POA: Diagnosis not present

## 2023-05-07 ENCOUNTER — Other Ambulatory Visit: Payer: Self-pay | Admitting: Internal Medicine

## 2023-05-28 ENCOUNTER — Other Ambulatory Visit: Payer: Medicare Other

## 2023-06-01 ENCOUNTER — Other Ambulatory Visit: Payer: Medicare Other

## 2023-06-01 DIAGNOSIS — R609 Edema, unspecified: Secondary | ICD-10-CM | POA: Diagnosis not present

## 2023-06-01 DIAGNOSIS — E119 Type 2 diabetes mellitus without complications: Secondary | ICD-10-CM

## 2023-06-01 DIAGNOSIS — Z0000 Encounter for general adult medical examination without abnormal findings: Secondary | ICD-10-CM | POA: Diagnosis not present

## 2023-06-01 DIAGNOSIS — E039 Hypothyroidism, unspecified: Secondary | ICD-10-CM | POA: Diagnosis not present

## 2023-06-01 DIAGNOSIS — I10 Essential (primary) hypertension: Secondary | ICD-10-CM | POA: Diagnosis not present

## 2023-06-02 ENCOUNTER — Ambulatory Visit: Payer: Medicare Other | Admitting: Internal Medicine

## 2023-06-02 LAB — CBC WITH DIFFERENTIAL/PLATELET
Absolute Lymphocytes: 1749 {cells}/uL (ref 850–3900)
Absolute Monocytes: 371 {cells}/uL (ref 200–950)
Basophils Absolute: 48 {cells}/uL (ref 0–200)
Basophils Relative: 0.9 %
Eosinophils Absolute: 212 {cells}/uL (ref 15–500)
Eosinophils Relative: 4 %
HCT: 43.8 % (ref 35.0–45.0)
Hemoglobin: 13.9 g/dL (ref 11.7–15.5)
MCH: 29.6 pg (ref 27.0–33.0)
MCHC: 31.7 g/dL — ABNORMAL LOW (ref 32.0–36.0)
MCV: 93.2 fL (ref 80.0–100.0)
MPV: 11 fL (ref 7.5–12.5)
Monocytes Relative: 7 %
Neutro Abs: 2920 {cells}/uL (ref 1500–7800)
Neutrophils Relative %: 55.1 %
Platelets: 207 10*3/uL (ref 140–400)
RBC: 4.7 10*6/uL (ref 3.80–5.10)
RDW: 13 % (ref 11.0–15.0)
Total Lymphocyte: 33 %
WBC: 5.3 10*3/uL (ref 3.8–10.8)

## 2023-06-02 LAB — HEMOGLOBIN A1C
Hgb A1c MFr Bld: 5.6 %{Hb} (ref <5.7)
Mean Plasma Glucose: 114 mg/dL
eAG (mmol/L): 6.3 mmol/L

## 2023-06-02 LAB — LIPID PANEL
Cholesterol: 196 mg/dL (ref <200)
HDL: 57 mg/dL (ref >=50)
LDL Cholesterol (Calc): 116 mg/dL — ABNORMAL HIGH
Non-HDL Cholesterol (Calc): 139 mg/dL — ABNORMAL HIGH (ref <130)
Total CHOL/HDL Ratio: 3.4 (calc) (ref <5.0)
Triglycerides: 115 mg/dL (ref <150)

## 2023-06-02 LAB — TSH: TSH: 0.51 m[IU]/L (ref 0.40–4.50)

## 2023-06-02 LAB — COMPLETE METABOLIC PANEL WITH GFR
AG Ratio: 1.8 (calc) (ref 1.0–2.5)
ALT: 17 U/L (ref 6–29)
AST: 18 U/L (ref 10–35)
Albumin: 4.5 g/dL (ref 3.6–5.1)
Alkaline phosphatase (APISO): 85 U/L (ref 37–153)
BUN: 15 mg/dL (ref 7–25)
CO2: 29 mmol/L (ref 20–32)
Calcium: 9.3 mg/dL (ref 8.6–10.4)
Chloride: 106 mmol/L (ref 98–110)
Creat: 0.94 mg/dL (ref 0.60–0.95)
Globulin: 2.5 g/dL (ref 1.9–3.7)
Glucose, Bld: 97 mg/dL (ref 65–99)
Potassium: 4.8 mmol/L (ref 3.5–5.3)
Sodium: 144 mmol/L (ref 135–146)
Total Bilirubin: 0.6 mg/dL (ref 0.2–1.2)
Total Protein: 7 g/dL (ref 6.1–8.1)
eGFR: 61 mL/min/{1.73_m2} (ref >=60)

## 2023-06-02 LAB — MICROALBUMIN / CREATININE URINE RATIO
Creatinine, Urine: 103 mg/dL (ref 20–275)
Microalb Creat Ratio: 41 mg/g{creat} — ABNORMAL HIGH (ref <30)
Microalb, Ur: 4.2 mg/dL

## 2023-06-05 NOTE — Progress Notes (Incomplete)
Annual Wellness Visit   Patient Care Team: Margaree Mackintosh, MD as PCP - General (Internal Medicine)  Visit Date: 06/05/23   No chief complaint on file.  Subjective:  Patient: Cassidy [name], Female DOB: 03-10-1941, 83 y.o. MRN: [id number] BP Readings from Last 1 Encounters:  01/26/23 118/72   Cassidy [name] is a 83 y.o. Female who presents today for her Annual Wellness Visit. Patient has history of Aortic Atherosclerosis, Cancer, Dependent Edema, Deep Venous Thrombosis, Heart Murmur, Fatty Liver, Kidney Stones, Benign Hypertension, Hypothyroidism, Lymphedema, Melanoma, Migraines, Osteopenia, Pulmonary Embolism, and Hypercholesterolemia.  History of kidney stones. Surgical kidney stone removal recently. Kidney function normal. Creatinine at 1.03, BUN at 13 on 05/26/22.  History of depended edema treated with Lasix 40 mg daily. Wraps ankles and soaks them regularly.  History of hypertension treated with Norvasc 5 mg daily.   History of hypothyroidism treated with Synthroid 200 mcg one tablet Mon-Sat and two tablets on Sunday. TSH normal at 3.65 on 05/26/22.  History of arthritis treated with Ultram 50 mg every 6 hours as needed.  HGBA1C at 5.8 on 05/26/22. LDL at 119 on 05/26/22, down from 128 on 05/14/21. She is not interested in using cholesterol medication at this time.  Labs 06/01/2023 CBC, compared with 2/52024: MCHC 31.7, decreased from 33.2; otherwise WNL.  CMP: WNL Lipid Panel: *** HgbA1c: 5.6, improved from 5.8 last year.  Microalbumin/Creatinine:  TSH: 0.51  Negative Cologuard 06/05/21.  Overdue for repeat Mammogram since 2020. Last completed 2019, initial screening done in March found possible asymmetry in the left breast, no malignancy noted in right breast. Subsequent diagnostic mammogram of the left breast suggested asymmetry probably benign, favored to represent postsurgical changes. F/u diagnostic mammogram in September found no significant changes during the 6 month  interim with another 6 month recommendation, which she never did.  Bone Density 05/28/2004 T-score Left Hip - 1.3, osteopenic.   Vaccine Counseling: Due for Flu, Covid-19, and Shingles 1/2; UTD on PNA Past Medical History:  Diagnosis Date   Aortic atherosclerosis (HCC) 02/10/2022   Cancer (HCC)    right foot,top of arm, lympth nodes removed   Degenerative arthritis of left knee    Dependent edema    DVT (deep venous thrombosis) (HCC)    Fatty liver    Heart murmur    occ   High risk surgery, pre-operative cardiovascular examination 11/06/2021   History of kidney stones    HTN (hypertension), benign    Hyperlipidemia    Hypothyroidism    Lymphedema    bi lat leggs   Melanoma (HCC)    Migraine 08/27/2010   Osteopenia    PE (pulmonary embolism) 2010   bilateral hx post op knee replacement   PONV (postoperative nausea and vomiting)    Pure hypercholesterolemia 11/06/2021   Shortness of breath 11/06/2021   on exertion    Medical/Surgical History Narrative:  Family History  Problem Relation Age of Onset   Heart disease Mother    Heart attack Father 71   Heart disease Father    Heart attack Brother    Diabetes Brother    Heart disease Brother    Breast cancer Daughter 62    Family History Narrative: Social History   Social History Narrative   Social history: Divorced.  Resides alone.  Does not smoke.  Social alcohol consumption.  2 adult children, a son and a daughter.  Daughter with history of breast cancer doing well.  3 granddaughters in good health.  Family history: Father died at age 59 of a sudden MI.  Mother died at age 55 of heart problems.  1 brother deceased in an airplane crash at age 33.  2 sisters in good health.       ROS  Objective:  Vitals: There were no vitals taken for this visit. Physical Exam Most Recent Functional Status Assessment:     No data to display         Most Recent Fall Risk Assessment:    05/27/2022   10:08 AM  Fall Risk    Falls in the past year? 0  Number falls in past yr: 0  Injury with Fall? 0  Risk for fall due to : No Fall Risks  Follow up Falls prevention discussed   Most Recent Depression Screenings:    05/27/2022   10:08 AM 05/20/2021   10:15 AM  PHQ 2/9 Scores  PHQ - 2 Score 0 0   Most Recent Cognitive Screening:    05/27/2022   10:11 AM  6CIT Screen  What Year? 0 points  What month? 0 points  What time? 0 points  Count back from 20 0 points  Months in reverse 0 points  Repeat phrase 0 points  Total Score 0 points   Results:  Studies Obtained And Personally Reviewed By Me:    Labs:     Component Value Date/Time   NA 144 06/01/2023 0918   NA 142 02/10/2022 1004   K 4.8 06/01/2023 0918   CL 106 06/01/2023 0918   CO2 29 06/01/2023 0918   GLUCOSE 97 06/01/2023 0918   BUN 15 06/01/2023 0918   BUN 14 02/10/2022 1004   CREATININE 0.94 06/01/2023 0918   CALCIUM 9.3 06/01/2023 0918   PROT 7.0 06/01/2023 0918   PROT 7.3 02/10/2022 1004   ALBUMIN 4.8 (H) 02/10/2022 1004   AST 18 06/01/2023 0918   ALT 17 06/01/2023 0918   ALKPHOS 102 02/10/2022 1004   BILITOT 0.6 06/01/2023 0918   BILITOT 0.6 02/10/2022 1004   GFRNONAA 57 (L) 12/16/2021 0830   GFRNONAA 50 (L) 05/14/2020 1109   GFRAA 58 (L) 05/14/2020 1109    Lab Results  Component Value Date   WBC 5.3 06/01/2023   HGB 13.9 06/01/2023   HCT 43.8 06/01/2023   MCV 93.2 06/01/2023   PLT 207 06/01/2023   Lab Results  Component Value Date   CHOL 196 06/01/2023   HDL 57 06/01/2023   LDLCALC 116 (H) 06/01/2023   TRIG 115 06/01/2023   CHOLHDL 3.4 06/01/2023   Lab Results  Component Value Date   HGBA1C 5.6 06/01/2023    Lab Results  Component Value Date   TSH 0.51 06/01/2023    {PSA (Optional):32132} Assessment & Plan:  No orders of the defined types were placed in this encounter.  Other Labs Reviewed today:       Annual wellness visit done today including the all of the following: Reviewed patient's Family  Medical History Reviewed and updated list of patient's medical providers Assessment of cognitive impairment was done Assessed patient's functional ability Established a written schedule for health screening services Health Risk Assessent Completed and Reviewed  Discussed health benefits of physical activity, and encouraged her to engage in regular exercise appropriate for her age and condition.    I,Emily Lagle,acting as a Neurosurgeon for Margaree Mackintosh, MD.,have documented all relevant documentation on the behalf of Margaree Mackintosh, MD,as directed by  Margaree Mackintosh, MD while in the  presence of Margaree Mackintosh, MD.   Marland Kitchen

## 2023-06-08 ENCOUNTER — Ambulatory Visit: Payer: Medicare Other | Admitting: Internal Medicine

## 2023-07-06 ENCOUNTER — Ambulatory Visit (INDEPENDENT_AMBULATORY_CARE_PROVIDER_SITE_OTHER): Payer: Medicare Other | Admitting: Podiatry

## 2023-07-06 DIAGNOSIS — Q828 Other specified congenital malformations of skin: Secondary | ICD-10-CM | POA: Diagnosis not present

## 2023-07-06 DIAGNOSIS — B351 Tinea unguium: Secondary | ICD-10-CM

## 2023-07-06 DIAGNOSIS — Z7901 Long term (current) use of anticoagulants: Secondary | ICD-10-CM | POA: Diagnosis not present

## 2023-07-06 DIAGNOSIS — M79676 Pain in unspecified toe(s): Secondary | ICD-10-CM | POA: Diagnosis not present

## 2023-07-06 NOTE — Progress Notes (Signed)
 Patient ID: Cassidy [name], female   DOB: September 29, 1940, 83 y.o.   MRN: [id number] Chief Complaint  Patient presents with   RFC    RM#13 RFC /CALLUSES     Cassidy [name], 83 year old female presents the office today for concerns of thick painful toenails and calluses to both of her feet as well as calluses.  No other concerns today and no open lesions.   Objective: AAO 3, NAD DP/PT pulses are palpable, CRT less than 3 seconds  Chronic edema present bilaterally Protective sensation absent with Dorann Ou monofilament Thick hyperkeratotic tissue plantar aspect right hallux with dried blood, left medial 1st metatarsal head. Nails are hypertrophic, dystrophic, brittle, discolored, elongated 9. No surrounding redness or drainage. Tenderness nails 1-5 bilaterally except for left second toe which has been amputated. No area pinpoint tenderness today. Compression wraps on the legs so the exam is limited to the feet.  No pain with calf compression, swelling, warmth, erythema.  Assessment: 83 year old female with pre-ulcerative calluses bilaterally, symptomatic onychomycosis  Plan: -Treatment options discussed including all alternatives, risks, and complications -Nail sharply debrided 9 without any complications or bleeding -Hyperkeratotic lesion sharply debrided  3 without complications. Monitor for any skin breakdown. Continue offloading. Moisturizer  -Continue compression, elevation. -Follow-up as scheduled or sooner if any problems arise. In the meantime, encouraged to call the office with any questions, concerns, change in symptoms.   Return in about 2 months (around 09/06/2023).   Ovid Curd, DPM

## 2023-07-28 ENCOUNTER — Ambulatory Visit (INDEPENDENT_AMBULATORY_CARE_PROVIDER_SITE_OTHER): Payer: Medicare Other | Admitting: Internal Medicine

## 2023-07-28 ENCOUNTER — Encounter: Payer: Self-pay | Admitting: Internal Medicine

## 2023-07-28 VITALS — BP 112/70 | HR 80 | Ht 65.0 in | Wt 214.2 lb

## 2023-07-28 DIAGNOSIS — E890 Postprocedural hypothyroidism: Secondary | ICD-10-CM | POA: Diagnosis not present

## 2023-07-28 DIAGNOSIS — Z85850 Personal history of malignant neoplasm of thyroid: Secondary | ICD-10-CM | POA: Diagnosis not present

## 2023-07-28 NOTE — Patient Instructions (Addendum)
 Hold BIOTIN 2-3 days before any future thyroid lab results

## 2023-07-28 NOTE — Progress Notes (Unsigned)
 Name: CLATIE KESSEN  MRN/ DOB: [id number], [birth year]    Age/ Sex: 83 y.o., female     PCP: Margaree Mackintosh, MD   Reason for Endocrinology Evaluation: [facility]     Initial Endocrinology Clinic Visit: 04/05/2020    PATIENT IDENTIFIER: Ms. Tinzley Dalia Lawal is a 83 y.o., female with a past medical history of HTN, Hx of DVT's and melanoma . She has followed with [facility] since 04/05/2020  for consultative assistance with management of her MNG.      HISTORICAL SUMMARY:  Pt was noted to have a tracheal deviation on CXR during pre-op evaluation. A CT scan showed a left thyroid necrotic mass that measures ~ 6.5 cm. 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   Afirma came back benign for the 2.6 cm nodule but no afirma results  for the 5.9 nodule due to low follicular content.  She is S/P total thyroidectomy on 06/17/2019 with a pathology report of 6.0 cm PTC   Of note she has been on LT- 4 replacement for years   A Thyrogen WBS 09/2019 showed Focal activity in thyroid bed consistent with remnant thyroid tissue and potential metastatic lymph node. Follow up thyroid bed ultrasound was unrevealing.    She is S/P RAI 53.5 mCi I-131  On 11/30/2019  Post-Op WBS scan demonstrated a Focus of increased tracer uptake at LEFT thyroid bed consistent with thyroid remnant and a  less intense focus of uptake cranial to the more intense focus at the LEFT thyroid bed, question additional thyroid remnant versus regional metastatic lymph node.   SUBJECTIVE:    Today (07/28/2023):  Ms. Yerby is here for a follow up on total thyroidectomy and a PTC diagnosis.   Patient continues to follow-up with podiatry for  onychomycosis Weight has been stable Has noted occasional palpitations  No changes in bowel movements, has stable chronic loose stools  Denies tremors  No local neck symptoms  NO Biotin   Levothyroxine 200 mcg,2 tablets on Friday, Saturday, and Sunday and 1 tablet Monday through Thursday     HISTORY:  Past Medical History:  Past Medical History:  Diagnosis Date   Aortic atherosclerosis (HCC) 02/10/2022   Cancer (HCC)    right foot,top of arm, lympth nodes removed   Degenerative arthritis of left knee    Dependent edema    DVT (deep venous thrombosis) (HCC)    Fatty liver    Heart murmur    occ   High risk surgery, pre-operative cardiovascular examination 11/06/2021   History of kidney stones    HTN (hypertension), benign    Hyperlipidemia    Hypothyroidism    Lymphedema    bi lat leggs   Melanoma (HCC)    Migraine 08/27/2010   Osteopenia    PE (pulmonary embolism) 2010   bilateral hx post op knee replacement   PONV (postoperative nausea and vomiting)    Pure hypercholesterolemia 11/06/2021   Shortness of breath 11/06/2021   on exertion   Past Surgical History:  Past Surgical History:  Procedure Laterality Date   ABDOMINAL HYSTERECTOMY     age 31   AMPUTATION  11/10/2011   Procedure: AMPUTATION RAY;  Surgeon: Nestor Lewandowsky, MD;  Location: [facility];  Service: Orthopedics;  Laterality: Left;  LEFT 2ND TOE TRANS MIDDLE PHALANX AMPUTATION    ANKLE FUSION  2005   lt-fusion   CHOLECYSTECTOMY  02/14/2002   COLONOSCOPY     CYSTOSCOPY WITH RETROGRADE PYELOGRAM, URETEROSCOPY AND STENT PLACEMENT Left 12/24/2021   Procedure: CYSTOSCOPY WITH RETROGRADE PYELOGRAM, URETEROSCOPY AND STENT REPLACEMENT;  Surgeon: Noel Christmas, MD;  Location: [facility];  Service: Urology;  Laterality: Left;   GANGLION CYST EXCISION  2008   Rt wrist   HOLMIUM LASER APPLICATION Left 12/24/2021   Procedure: HOLMIUM LASER APPLICATION;  Surgeon: Noel Christmas, MD;  Location: [facility];  Service: Urology;  Laterality: Left;   IR NEPHROSTOGRAM LEFT THRU EXISTING ACCESS  11/14/2021   IR URETERAL STENT PLACEMENT EXISTING ACCESS LEFT  11/12/2021   JOINT REPLACEMENT  2010   rt total knee   KNEE ARTHROSCOPY Right 09/2001   Rt knee   NEPHROLITHOTOMY Left 11/12/2021   Procedure: NEPHROLITHOTOMY PERCUTANEOUS;  Surgeon: Noel Christmas, MD;  Location: [facility];  Service: Urology;  Laterality: Left;  3 HRS   OVARIAN CYST SURGERY Left    age 19 and appendectomy   RECTOCELE REPAIR  2009   REDUCTION MAMMAPLASTY Bilateral    THYROIDECTOMY N/A 06/17/2019   Procedure: TOTAL THYROIDECTOMY;  Surgeon: Darnell Level, MD;  Location: [facility];  Service: General;  Laterality: N/A;   TOTAL KNEE ARTHROPLASTY Left 12/06/2018   Procedure: Left Knee Arthroplasty;  Surgeon: Gean Birchwood, MD;  Location: [facility];  Service: Orthopedics;  Laterality: Left;   UPPER GI ENDOSCOPY     Social History:  reports that she has never smoked. She has never used smokeless tobacco. She reports that she does not drink alcohol and does not use drugs. Family History:  Family History  Problem Relation Age of Onset   Heart disease Mother    Heart attack Father 81   Heart disease Father    Heart attack Brother    Diabetes Brother    Heart disease Brother    Breast cancer Daughter 57     HOME MEDICATIONS: Allergies as of 07/28/2023   No Known Allergies      Medication List        Accurate as of July 28, 2023  7:59 AM. If you have any questions, ask your nurse or doctor.          acetaminophen 325 MG tablet Commonly known as: TYLENOL Take 650 mg by mouth every 6 (six) hours as needed for moderate pain or headache.   amLODipine 5 MG tablet Commonly known as: NORVASC TAKE ONE TABLET EVERY DAY   co-enzyme Q-10 30 MG capsule Take 30 mg by mouth 3 (three) times daily.   furosemide 40 MG tablet Commonly known as: LASIX TAKE ONE TABLET DAILY   Garlic 10 MG Caps Take by mouth.    HYDROcodone-acetaminophen 5-325 MG tablet Commonly known as: NORCO/VICODIN Take 1 tablet by mouth every 4 (four) hours as needed.   levothyroxine 200 MCG tablet Commonly known as: SYNTHROID Take 2 tablets (400 mcg total) by mouth daily before breakfast. What changed: additional instructions   promethazine 25 MG tablet Commonly known as: PHENERGAN Take 1 tablet (25 mg total) by mouth every 8 (eight) hours as needed for nausea or vomiting.   Red Yeast Rice 55 MG Caps Take by mouth.          OBJECTIVE:   PHYSICAL EXAM: VS: BP 112/70 (BP Location: Left Arm, Patient Position: Sitting, Cuff Size: Normal)  Pulse 80   Ht 5\' 5"  (1.651 m)   Wt 214 lb 3.2 oz (97.2 kg)   SpO2 99%   BMI 35.64 kg/m     EXAM: General: Pt appears well and is in NAD  Neck: General: Supple without adenopathy. Thyroid: No nodules appreciated  Lungs: Clear with good BS bilat   Heart: Auscultation: RRR.  Mental Status: Judgment, insight: Intact Orientation: Oriented to time, place, and person Mood and affect: No depression, anxiety, or agitation     DATA REVIEWED:   Latest Reference Range & Units 07/28/23 08:32  TSH 0.40 - 4.50 mIU/L 2.73    Pathology report 06/17/2019 Procedure: Total thyroidectomy  Tumor Focality: Unifocal  Tumor Site: Left thyroid lobe  Tumor Size: 6.0 cm  Histologic Type: Papillary thyroid carcinoma, follicular variant  Margins: Uninvolved by tumor  Angioinvasion: Not identified  Lymphatic Invasion: Not identified  Extrathyroidal extension: Not identified  Regional Lymph Nodes: No lymph nodes submitted or found  Pathologic Stage Classification (pTNM, AJCC 8th Edition): pT3a, pNx  field    WBS 12/09/19  Focus of increased tracer uptake at LEFT thyroid bed consistent with thyroid remnant.   Additional less intense focus of uptake cranial to the more intense focus at the LEFT thyroid bed, question additional thyroid remnant versus regional metastatic lymph  node.    Thyroid bed ultrasound 02/05/2023    FINDINGS: Isthmus: Surgically absent. There is no residual nodular soft tissue within the isthmic resection bed.   Right lobe: Surgically absent. There is no residual nodular soft tissue within the right lobectomy resection bed.   Left lobe: Surgically absent. There is no residual nodular soft tissue within the left lobectomy resection bed.   _________________________________________________________   No regional cervical lymphadenopathy.   IMPRESSION: Post total thyroidectomy without evidence of residual or locally recurrent disease.   ASSESSMENT / PLAN / RECOMMENDATIONS:   Papillary thyroid carcinoma ( pT3a, pNx) , Stage II  - S/P RAI ablation 11/2019  -Patient with excellent biochemical and structural response to therapy - TSH goal for  is ~ 0.5-2.0  uIU/mL given advanced age and increased risk of cardiac arrhythmia with low TSH levels - Tg,  Tg Ab pending, historically has been low   2. Post-Operative Hypothyroidism:   - Pt is clinically euthyroid  -TSH is slightly above goal, but I would not change the dose at this time as her TFTs have been fluctuating and difficult to manage  Medications   Continue  levothyroxine 200 mcg ,2 tablets on Friday, Saturday, and Sunday and  1 tablet Monday through Thursday   F/u in 6 months   Signed electronically by: Lyndle Herrlich, MD  [facility]  [facility] [address] [place] [place], [place] [address] Phone: [phone] FAX: [phone]      CC: Margaree Mackintosh, MD [address] [place] [address] Phone: [phone]  Fax: [phone]   Return to Endocrinology clinic as below: No future appointments.

## 2023-07-29 ENCOUNTER — Encounter: Payer: Self-pay | Admitting: Internal Medicine

## 2023-07-30 DIAGNOSIS — M170 Bilateral primary osteoarthritis of knee: Secondary | ICD-10-CM | POA: Diagnosis not present

## 2023-07-31 DIAGNOSIS — L03115 Cellulitis of right lower limb: Secondary | ICD-10-CM | POA: Diagnosis not present

## 2023-07-31 DIAGNOSIS — L03116 Cellulitis of left lower limb: Secondary | ICD-10-CM | POA: Diagnosis not present

## 2023-08-04 DIAGNOSIS — M170 Bilateral primary osteoarthritis of knee: Secondary | ICD-10-CM | POA: Diagnosis not present

## 2023-08-04 NOTE — Progress Notes (Signed)
 Annual Wellness Visit   Patient Care Team: Sylvan Evener, MD as PCP - General (Internal Medicine)  Visit Date: 08/14/23   Chief Complaint  Patient presents with   Annual Exam   Subjective:  Patient: Cassidy [name], Female DOB: [birth year], 83 y.o. MRN: [id number]  Cassidy [name] is a 83 y.o. Female who presents today for her Annual Wellness Visit. Patient has Hypertension; Hypothyroidism; Obesity; Migraine; History of Impaired Glucose Tolerance; History of Pulmonary Embolism; S/p Right Unicompartmental Knee Replacement; Onychomycosis; HAV (Hallux Abducto Valgus); Hypothyroidism due to Acquired Atrophy of Thyroid ; Impaired Fasting Glucose; Lymphedema of Both Lower Extremities; Venous Hypertension of Both Lower Extremities; Venous Insufficiency (Chronic) (Peripheral); Degenerative Arthritis of Left Knee; Goiter; Total Knee Replacement Status, Left; Neoplasm of Uncertain Behavior of Thyroid  Gland; Multiple Thyroid  Nodules; Postoperative Hypothyroidism; Primary Thyroid  Papillary Carcinoma; Hemangioma of Skin and Subcutaneous Tissue; Lentigo; Melanocytic Nevi of Trunk; Melanoma in Situ of Left Lower Limb, Including Hip; Neoplasm of Uncertain Behavior of Skin; Actinic Keratosis; Personal History of Malignant Melanoma of Skin; Pure Hypercholesterolemia; Staghorn Calculus; Aortic Atherosclerosis; and Hx of Thyroid  Cancer.  History of Hypertension treated with Amlodipine  5 mg daily. Blood Pressure: normotensive today at 128/82. Dependent Edema treated with Lasix  40 mg daily and she also wraps her ankles and soaks them regularly.  History of Pure Hypercholesterolemia with 06/01/2023 Lipid Panel, compared to 05/2022: LDL 116, decreased from 119.  History of Impaired Glucose Tolerance; Impaired Fasting Glucose with 06/01/2023 HgbA1c: 5.6; Glucose 97.    Hx of lumbar spondylosis treated by Dr. Carry Clapper.  History of Hypothyroidism treated with Levothyroxine  200 mcg Monday-Friday and 400 mcg Saturday &  Sunday. 07/28/2023 TSH 2.73, elevated from 0.51 in 05/2023, which had been decreased from 1.04 in 03/2023. Followed by Dr. Rosalea Collin, Endocrinologist.  History of Left staghorn  kidney calculus in 2023 treated with laser lithotripsy and left ureteral stent exchange in September 2023. 06/01/2023 Microalb/Creat, compared to 05/2022: 41, elevated from 22; kidney functions otherwise normal.    Labs 06/01/2023 CBC, compared to 05/2022: MCHC 31.7, decreased from 33.2; otherwise WNL.  CMP: WNL   S/p Abdominal Hysterectomy 1980.   Overdue for Mammogram with 07/06/2017 noting possible asymmetry of the left breast, repeat unilateral mammo in 12/2017 suggesting this was probably benign related to previous reduction mammoplasty, but recommended a 6 month f/u to ensure stability.  Apparent Colonoscopy 2007 at [facility] by Dr. Lucius Sabins; Colo-guard 06/05/2021 NEGATIVE. Repeats have been discontinued due to age.  Bone Density 05/28/2004 T-score Left Femoral Neck  -1.3, osteopenic.   Vaccine Counseling: Due for Covid-19 and Shingles 1/2; UTD on Flu, PNA, and Tdap. Past Medical History:  Diagnosis Date   Aortic atherosclerosis (HCC) 02/10/2022   Cancer (HCC)    right foot,top of arm, lympth nodes removed   Degenerative arthritis of left knee    Dependent edema    DVT (deep venous thrombosis) (HCC)    Fatty liver    Heart murmur    occ   High risk surgery, pre-operative cardiovascular examination 11/06/2021   History of kidney stones    HTN (hypertension), benign    Hyperlipidemia    Hypothyroidism    Lymphedema    bi lat leggs   Melanoma (HCC)    Migraine 08/27/2010   Osteopenia    PE (pulmonary embolism) 2010   bilateral hx post op knee replacement   PONV (postoperative nausea and vomiting)    Pure hypercholesterolemia 11/06/2021   Shortness of breath 11/06/2021  on exertion  Medical/Surgical History Narrative:  Allergic/Intolerant to: No Known Allergies  2021 - Total  Thyroidectomy in February by Dr. Sofia Dunn. Was found to have papillary thyroid  carcinoma, left thyroid  lobe; margins negative for tumor. Had benign nodules in right inferior lobe and posterior aspect of mid-left lobe.   2020 - Left TKA by Dr. Carry Clapper in August  2016 - in March developed redness, swelling, and drainage of previously healed wounds on right foot/ankle; was placed on antibiotics & DVT was ruled out. Subsequently seen at [facility] and was diagnosed with delayed surgical wound healing and treated for several months at [facility]. Also had Excision of Melanoma, Right Foot in March and Dysplastic Nevus, Right Shoulder was discovered, which she also underwent a wide excision for.   2015 - Wide Excision of Melanoma, Right Back  2010 - Right TKA in July and subsequently developed Bilateral PEs  2009 - Posterior Colporrhaphy; prior to surgery had history of UTIs. Negative Cardiolite Study done in April.   2008 - Ganglion Cyst Surgery, Right Wrist  2005 - Left Ankle Surgery involving Bone Graft  2003 - Arthroscopic Surgery for Torn Ligaments, Right Knee. Cholecystectomy.  1968 - Ovarian Cystectomy  Other - Hx of: Dermatophytosis, seen by Podiatry. Surghx of: Cataract Extraction, Left Eye Past Surgical History:  Procedure Laterality Date   ABDOMINAL HYSTERECTOMY     age 68   AMPUTATION  11/10/2011   Procedure: AMPUTATION RAY;  Surgeon: Ilean Mall, MD;  Location: [facility];  Service: Orthopedics;  Laterality: Left;  LEFT 2ND TOE TRANS MIDDLE PHALANX AMPUTATION    ANKLE FUSION  2005   lt-fusion   CHOLECYSTECTOMY  02/14/2002   COLONOSCOPY     CYSTOSCOPY WITH RETROGRADE PYELOGRAM, URETEROSCOPY AND STENT PLACEMENT Left 12/24/2021   Procedure: CYSTOSCOPY WITH RETROGRADE PYELOGRAM, URETEROSCOPY AND STENT REPLACEMENT;  Surgeon: Roxane Copp, MD;  Location: [facility];  Service: Urology;  Laterality: Left;   GANGLION CYST EXCISION  2008   Rt  wrist   HOLMIUM LASER APPLICATION Left 12/24/2021   Procedure: HOLMIUM LASER APPLICATION;  Surgeon: Roxane Copp, MD;  Location: [facility];  Service: Urology;  Laterality: Left;   IR NEPHROSTOGRAM LEFT THRU EXISTING ACCESS  11/14/2021   IR URETERAL STENT PLACEMENT EXISTING ACCESS LEFT  11/12/2021   JOINT REPLACEMENT  2010   rt total knee   KNEE ARTHROSCOPY Right 09/2001   Rt knee   NEPHROLITHOTOMY Left 11/12/2021   Procedure: NEPHROLITHOTOMY PERCUTANEOUS;  Surgeon: Roxane Copp, MD;  Location: [facility];  Service: Urology;  Laterality: Left;  3 HRS   OVARIAN CYST SURGERY Left    age 89 and appendectomy   RECTOCELE REPAIR  2009   REDUCTION MAMMAPLASTY Bilateral    THYROIDECTOMY N/A 06/17/2019   Procedure: TOTAL THYROIDECTOMY;  Surgeon: Oralee Billow, MD;  Location: [facility];  Service: General;  Laterality: N/A;   TOTAL KNEE ARTHROPLASTY Left 12/06/2018   Procedure: Left Knee Arthroplasty;  Surgeon: Wendolyn Hamburger, MD;  Location: [facility];  Service: Orthopedics;  Laterality: Left;   UPPER GI ENDOSCOPY     Family History  Problem Relation Age of Onset   Heart disease Mother    Heart attack Father 49   Heart disease Father    Heart attack Brother    Diabetes Brother    Heart disease Brother    Breast cancer Daughter 62   Social History   Social History Narrative   Divorced. Resides alone. Non-smoker,  social alcohol consumption. Has 2 adult children, a son and a daughter, and 3 grandchildren.    2025 - went to a wedding in 2024 and is going to another wedding in 01/25/24      Fhx:    Father, deceased aged 3 of an Acute MI, w/ hx of Heart Disease   Mother, deceased aged 31 of Heart Disease   1 Brother, deceased aged 11 due to an airplane crash w/ hx of Diabetes and Heart Disease   2 Sisters in good health   3 Daughters -1 w/ hx of Breast Cancer onset aged 85   Review of Systems  Constitutional:  Negative for chills, fever, malaise/fatigue and weight loss.  HENT:  Negative for  hearing loss, sinus pain and sore throat.   Respiratory:  Negative for cough, hemoptysis and shortness of breath.   Cardiovascular:  Negative for chest pain, palpitations, leg swelling and PND.  Gastrointestinal:  Negative for abdominal pain, constipation, diarrhea, heartburn, nausea and vomiting.  Genitourinary:  Negative for dysuria, frequency and urgency.  Musculoskeletal:  Negative for back pain, myalgias and neck pain.  Skin:  Negative for itching and rash.  Neurological:  Negative for dizziness, tingling, seizures and headaches.  Endo/Heme/Allergies:  Negative for polydipsia.  Psychiatric/Behavioral:  Negative for depression. The patient is not nervous/anxious.     Objective:  Vitals: BP 128/82   Pulse 90   Temp (!) 97.2 F (36.2 C) (Temporal)   Ht 5\' 5"  (1.651 m)   Wt 211 lb (95.7 kg)   SpO2 97%   BMI 35.11 kg/m  Physical Exam Vitals and nursing note reviewed.  Constitutional:      General: She is not in acute distress.    Appearance: Normal appearance. She is not ill-appearing or toxic-appearing.  HENT:     Head: Normocephalic and atraumatic.     Right Ear: Hearing, tympanic membrane, ear canal and external ear normal.     Left Ear: Hearing, tympanic membrane, ear canal and external ear normal.     Mouth/Throat:     Pharynx: Oropharynx is clear.  Eyes:     Extraocular Movements: Extraocular movements intact.     Pupils: Pupils are equal, round, and reactive to light.  Neck:     Thyroid : No thyroid  mass, thyromegaly or thyroid  tenderness.     Vascular: No carotid bruit.  Cardiovascular:     Rate and Rhythm: Normal rate and regular rhythm. No extrasystoles are present.    Pulses:          Dorsalis pedis pulses are 2+ on the right side and 2+ on the left side.     Heart sounds: Normal heart sounds. No murmur heard.    No friction rub. No gallop.  Pulmonary:     Effort: Pulmonary effort is normal.     Breath sounds: Normal breath sounds. No decreased breath sounds,  wheezing, rhonchi or rales.  Chest:     Chest wall: No mass.  Abdominal:     Palpations: Abdomen is soft. There is no hepatomegaly, splenomegaly or mass.     Tenderness: There is no abdominal tenderness.     Hernia: No hernia is present.  Musculoskeletal:     Cervical back: Normal range of motion.     Right lower leg: No edema.     Left lower leg: No edema.  Lymphadenopathy:     Cervical: No cervical adenopathy.     Upper Body:     Right upper body: No supraclavicular  adenopathy.     Left upper body: No supraclavicular adenopathy.  Skin:    General: Skin is warm and dry.  Neurological:     General: No focal deficit present.     Mental Status: She is alert and oriented to person, place, and time. Mental status is at baseline.     Sensory: Sensation is intact.     Motor: Motor function is intact. No weakness.     Deep Tendon Reflexes: Reflexes are normal and symmetric.  Psychiatric:        Attention and Perception: Attention normal.        Mood and Affect: Mood normal.        Speech: Speech normal.        Behavior: Behavior normal.        Thought Content: Thought content normal.        Cognition and Memory: Cognition normal.        Judgment: Judgment normal.   Most Recent Functional Status Assessment:    08/14/2023    2:14 PM  In your present state of health, do you have any difficulty performing the following activities:  Hearing? 0  Vision? 0  Difficulty concentrating or making decisions? 0  Walking or climbing stairs? 0  Dressing or bathing? 0  Doing errands, shopping? 0  Preparing Food and eating ? N  Using the Toilet? N  In the past six months, have you accidently leaked urine? Y  Comment Wears pad  Do you have problems with loss of bowel control? N  Managing your Medications? N  Managing your Finances? N  Housekeeping or managing your Housekeeping? N   Most Recent Fall Risk Assessment:    08/14/2023    2:21 PM  Fall Risk   Falls in the past year? 0  Number  falls in past yr: 0  Injury with Fall? 0  Risk for fall due to : No Fall Risks  Follow up Falls evaluation completed   Most Recent Depression Screenings:    08/14/2023    2:21 PM 05/27/2022   10:08 AM  PHQ 2/9 Scores  PHQ - 2 Score 0 0   Most Recent Cognitive Screening:    08/14/2023    2:21 PM  6CIT Screen  What month? 3 points  What time? 3 points  Count back from 20 4 points  Months in reverse 4 points  Repeat phrase 10 points   Results:  Studies Obtained And Personally Reviewed By Me:  Mammogram 07/06/2017 noting possible asymmetry of the left breast, repeat unilateral mammo in 12/2017 suggesting this was probably benign related to previous reduction mammoplasty.  Apparent Colonoscopy 2007 at [facility] by Dr. Lucius Sabins; Colo-guard 06/05/2021 NEGATIVE.   Bone Density 05/28/2004 T-score Left Femoral Neck  -1.3, osteopenic.   Labs:     Component Value Date/Time   NA 144 06/01/2023 0918   NA 142 02/10/2022 1004   K 4.8 06/01/2023 0918   CL 106 06/01/2023 0918   CO2 29 06/01/2023 0918   GLUCOSE 97 06/01/2023 0918   BUN 15 06/01/2023 0918   BUN 14 02/10/2022 1004   CREATININE 0.94 06/01/2023 0918   CALCIUM  9.3 06/01/2023 0918   PROT 7.0 06/01/2023 0918   PROT 7.3 02/10/2022 1004   ALBUMIN 4.8 (H) 02/10/2022 1004   AST 18 06/01/2023 0918   ALT 17 06/01/2023 0918   ALKPHOS 102 02/10/2022 1004   BILITOT 0.6 06/01/2023 0918   BILITOT 0.6 02/10/2022 1004  GFRNONAA 57 (L) 12/16/2021 0830   GFRNONAA 50 (L) 05/14/2020 1109   GFRAA 58 (L) 05/14/2020 1109    Lab Results  Component Value Date   WBC 5.3 06/01/2023   HGB 13.9 06/01/2023   HCT 43.8 06/01/2023   MCV 93.2 06/01/2023   PLT 207 06/01/2023   Lab Results  Component Value Date   CHOL 196 06/01/2023   HDL 57 06/01/2023   LDLCALC 116 (H) 06/01/2023   TRIG 115 06/01/2023   CHOLHDL 3.4 06/01/2023   Lab Results  Component Value Date   HGBA1C 5.6 06/01/2023    Lab Results  Component  Value Date   TSH 2.73 07/28/2023    Assessment & Plan:   Orders Placed This Encounter  Procedures   POCT URINALYSIS DIP (CLINITEK)  No orders of the defined types were placed in this encounter. Other Labs Reviewed today: CBC, compared to 05/2022: MCHC 31.7, decreased from 33.2; otherwise WNL.  CMP: WNL   Hypertension treated with Amlodipine  5 mg daily. Blood Pressure: normotensive today at 128/82.   Dependent Edema treated with Lasix  40 mg daily and she also wraps her ankles and soaks them regularly.  Pure Hypercholesterolemia with 06/01/2023 Lipid Panel, compared to 05/2022: LDL 116, decreased from 119.  History of Impaired Glucose Tolerance; Impaired Fasting Glucose with 06/01/2023 HgbA1c: 5.6; Glucose 97.    Hypothyroidism treated with Levothyroxine  200 mcg Monday-Friday and 400 mcg Saturday & Sunday. 07/28/2023 TSH 2.73, elevated from 0.51 in 05/2023, which had been decreased from 1.04 in 03/2023. Followed by Dr. Rosalea Collin, Endocrinologist.  History of Left Staghorn Calculus in 2023 treated with laser lithotripsy and left ureteral stent exchange in September 2023. 06/01/2023 Microalb/Creat, compared to 05/2022: 41, elevated from 22; kidney functions otherwise normal.    S/p Abdominal Hysterectomy 1980.   Overdue for Mammogram with 07/06/2017 noting possible asymmetry of the left breast, repeat unilateral mammo in 12/2017 suggesting this was probably benign related to previous reduction mammoplasty, but recommended a 6 month f/u to ensure stability.  Apparent Colonoscopy 2007 at [facility] by Dr. Lucius Sabins; Colo-guard 06/05/2021 NEGATIVE. Repeats have been discontinued due to age.  Bone Density 05/28/2004 T-score Left Femoral Neck  -1.3, osteopenic.   Vaccine Counseling: Due for Covid-19 and Shingles 1/2; UTD on Flu, PNA, and Tdap.   Annual wellness visit done today including the all of the following: Reviewed patient's Family Medical History Reviewed and updated  list of patient's medical providers Assessment of cognitive impairment was done Assessed patient's functional ability Established a written schedule for health screening services Health Risk Assessent Completed and Reviewed  Discussed health benefits of physical activity, and encouraged her to engage in regular exercise appropriate for her age and condition.    I,Emily Lagle,acting as a Neurosurgeon for Sylvan Evener, MD.,have documented all relevant documentation on the behalf of Sylvan Evener, MD,as directed by  Sylvan Evener, MD while in the presence of Sylvan Evener, MD.   I, Sylvan Evener, MD, have reviewed all documentation for this visit. The documentation on 08/18/23 for the exam, diagnosis, procedures, and orders are all accurate and complete.

## 2023-08-06 LAB — TSH: TSH: 2.73 m[IU]/L (ref 0.40–4.50)

## 2023-08-06 LAB — THYROGLOBULIN, LC/MS/MS: THYROGLOBULIN, LC/MS/MS: <0.4 ng/mL

## 2023-08-14 ENCOUNTER — Ambulatory Visit: Admitting: Internal Medicine

## 2023-08-14 ENCOUNTER — Encounter: Payer: Self-pay | Admitting: Internal Medicine

## 2023-08-14 VITALS — BP 128/82 | HR 90 | Temp 97.2°F | Ht 65.0 in | Wt 211.0 lb

## 2023-08-14 DIAGNOSIS — E119 Type 2 diabetes mellitus without complications: Secondary | ICD-10-CM

## 2023-08-14 DIAGNOSIS — Z8659 Personal history of other mental and behavioral disorders: Secondary | ICD-10-CM

## 2023-08-14 DIAGNOSIS — Z6835 Body mass index (BMI) 35.0-35.9, adult: Secondary | ICD-10-CM

## 2023-08-14 DIAGNOSIS — R7302 Impaired glucose tolerance (oral): Secondary | ICD-10-CM | POA: Diagnosis not present

## 2023-08-14 DIAGNOSIS — I10 Essential (primary) hypertension: Secondary | ICD-10-CM

## 2023-08-14 DIAGNOSIS — Z9889 Other specified postprocedural states: Secondary | ICD-10-CM

## 2023-08-14 DIAGNOSIS — R609 Edema, unspecified: Secondary | ICD-10-CM | POA: Diagnosis not present

## 2023-08-14 DIAGNOSIS — E039 Hypothyroidism, unspecified: Secondary | ICD-10-CM | POA: Diagnosis not present

## 2023-08-14 DIAGNOSIS — Z87442 Personal history of urinary calculi: Secondary | ICD-10-CM

## 2023-08-14 DIAGNOSIS — Z0000 Encounter for general adult medical examination without abnormal findings: Secondary | ICD-10-CM

## 2023-08-14 DIAGNOSIS — M47816 Spondylosis without myelopathy or radiculopathy, lumbar region: Secondary | ICD-10-CM

## 2023-08-14 DIAGNOSIS — Z86711 Personal history of pulmonary embolism: Secondary | ICD-10-CM

## 2023-08-14 DIAGNOSIS — Z85820 Personal history of malignant melanoma of skin: Secondary | ICD-10-CM

## 2023-08-14 LAB — POCT URINALYSIS DIP (CLINITEK)
Bilirubin, UA: NEGATIVE
Blood, UA: NEGATIVE
Glucose, UA: NEGATIVE mg/dL
Ketones, POC UA: NEGATIVE mg/dL
Nitrite, UA: NEGATIVE
POC PROTEIN,UA: 30 — AB
Spec Grav, UA: 1.015 (ref 1.010–1.025)
Urobilinogen, UA: 0.2 U/dL
pH, UA: 6 (ref 5.0–8.0)

## 2023-08-18 ENCOUNTER — Encounter: Payer: Self-pay | Admitting: Internal Medicine

## 2023-08-18 NOTE — Patient Instructions (Addendum)
 It was a pleasure to see you today. Continue same medications. Watch diet and be as active as possible RTC in one year or as needed.

## 2023-09-17 ENCOUNTER — Ambulatory Visit: Admitting: Podiatry

## 2023-10-12 ENCOUNTER — Ambulatory Visit: Admitting: Internal Medicine

## 2023-10-12 ENCOUNTER — Ambulatory Visit (INDEPENDENT_AMBULATORY_CARE_PROVIDER_SITE_OTHER): Admitting: Podiatry

## 2023-10-12 DIAGNOSIS — B351 Tinea unguium: Secondary | ICD-10-CM

## 2023-10-12 DIAGNOSIS — Q828 Other specified congenital malformations of skin: Secondary | ICD-10-CM | POA: Diagnosis not present

## 2023-10-12 DIAGNOSIS — Z7901 Long term (current) use of anticoagulants: Secondary | ICD-10-CM | POA: Diagnosis not present

## 2023-10-12 DIAGNOSIS — M79676 Pain in unspecified toe(s): Secondary | ICD-10-CM

## 2023-10-12 NOTE — Progress Notes (Unsigned)
 Patient ID: Ariyona  R Mullin, female   DOB: [birth year], 83 y.o.   MRN: [id number] Chief Complaint  Patient presents with   RFC    RM#13 RFC patient has no concerns at this time.    Ms. Hankey, 83 year old female presents the office today for concerns of thick painful toenails and calluses to both of her feet as well as calluses.  No other concerns today and no open lesions.   Objective: AAO 3, NAD DP/PT pulses are palpable, CRT less than 3 seconds  Chronic edema present bilaterally-unchanged  Protective sensation absent with Sallye Speed monofilament Thick hyperkeratotic tissue plantar aspect right hallux with dried blood, left medial 1st metatarsal head. Nails are hypertrophic, dystrophic, brittle, discolored, elongated 9. No surrounding redness or drainage. Tenderness nails 1-5 bilaterally except for left second toe which has been amputated. No area pinpoint tenderness today. Compression wraps on the legs so the exam is limited to the feet.  No pain with calf compression, swelling, warmth, erythema.  Assessment: 83 year old female with pre-ulcerative calluses bilaterally, symptomatic onychomycosis  Plan: -Treatment options discussed including all alternatives, risks, and complications -Nail sharply debrided 9 without any complications or bleeding -Hyperkeratotic lesion sharply debrided  3 without complications. Monitor for any skin breakdown. Continue offloading. Moisturizer  -Continue compression, elevation. -Follow-up as scheduled or sooner if any problems arise. In the meantime, encouraged to call the office with any questions, concerns, change in symptoms.   Return in about 3 months (around 01/12/2024).   Donnice Fees, DPM

## 2024-01-18 ENCOUNTER — Ambulatory Visit: Admitting: Podiatry

## 2024-01-27 ENCOUNTER — Ambulatory Visit: Admitting: Internal Medicine

## 2024-01-27 NOTE — Progress Notes (Deleted)
 Name: Cassidy [name]  MRN/ DOB: [id number], 1941-03-21    Age/ Sex: 83 y.o., female     PCP: Perri Ronal PARAS, MD   Reason for Endocrinology Evaluation: [facility]     Initial Endocrinology Clinic Visit: 04/05/2020    PATIENT IDENTIFIER: Cassidy [name] is a 83 y.o., female with a past medical history of HTN, Hx of DVT's and melanoma . She has followed with [facility] since 04/05/2020  for consultative assistance with management of her MNG.      HISTORICAL SUMMARY:  Pt was noted to have a tracheal deviation on CXR during pre-op evaluation. A CT scan showed a left thyroid  necrotic mass that measures ~ 6.5 cm. 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   Afirma came back benign for the 2.6 cm nodule but no afirma results  for the 5.9 nodule due to low follicular content.  She is S/P total thyroidectomy on 06/17/2019 with a pathology report of 6.0 cm PTC   Of note she has been on LT- 4 replacement for years   A Thyrogen  WBS 09/2019 showed Focal activity in thyroid  bed consistent with remnant thyroid  tissue and potential metastatic lymph node. Follow up thyroid  bed ultrasound was unrevealing.    She is S/P RAI 53.5 mCi I-131  On 11/30/2019  Post-Op WBS scan demonstrated a Focus of increased tracer uptake at LEFT thyroid  bed consistent with thyroid  remnant and a  less intense focus of uptake cranial to the more intense focus at the LEFT thyroid  bed, question additional thyroid  remnant versus regional metastatic lymph node.   SUBJECTIVE:    Today (01/27/2024):  Cassidy [name] is here for a follow up on total thyroidectomy and a PTC diagnosis.   Patient continues to follow-up with podiatry for  onychomycosis Weight has been stable Has noted occasional palpitations  No changes in bowel movements, has stable chronic loose stools  Denies tremors  No local neck symptoms  NO Biotin   Levothyroxine  200 mcg,2 tablets on Friday, Saturday, and Sunday and 1 tablet Monday through Thursday     HISTORY:  Past Medical History:  Past Medical History:  Diagnosis Date   Aortic atherosclerosis 02/10/2022   Cancer (HCC)    right foot,top of arm, lympth nodes removed   Degenerative arthritis of left knee    Dependent edema    DVT (deep venous thrombosis) (HCC)    Fatty liver    Heart murmur    occ   High risk surgery, pre-operative cardiovascular examination 11/06/2021   History of kidney stones    HTN (hypertension), benign    Hyperlipidemia    Hypothyroidism    Lymphedema    bi lat leggs   Melanoma (HCC)    Migraine 08/27/2010   Osteopenia    PE (pulmonary embolism) 2010   bilateral hx post op knee replacement   PONV (postoperative nausea and vomiting)    Pure hypercholesterolemia 11/06/2021   Shortness of breath 11/06/2021   on exertion   Past Surgical History:  Past Surgical History:  Procedure Laterality Date   ABDOMINAL HYSTERECTOMY     age 35   AMPUTATION  11/10/2011   Procedure: AMPUTATION RAY;  Surgeon: Dempsey PARAS Sensor, MD;  Location: [facility];  Service: Orthopedics;  Laterality: Left;  LEFT 2ND TOE TRANS MIDDLE PHALANX AMPUTATION    ANKLE FUSION  2005   lt-fusion   CHOLECYSTECTOMY  02/14/2002   COLONOSCOPY     CYSTOSCOPY WITH RETROGRADE PYELOGRAM, URETEROSCOPY AND STENT PLACEMENT Left 12/24/2021   Procedure: CYSTOSCOPY WITH RETROGRADE PYELOGRAM, URETEROSCOPY AND STENT REPLACEMENT;  Surgeon: Elisabeth Valli BIRCH, MD;  Location: [facility];  Service: Urology;  Laterality: Left;   GANGLION CYST EXCISION  2008   Rt wrist   HOLMIUM LASER APPLICATION Left 12/24/2021   Procedure: HOLMIUM LASER APPLICATION;  Surgeon: Elisabeth Valli BIRCH, MD;  Location: [facility];   Service: Urology;  Laterality: Left;   IR NEPHROSTOGRAM LEFT THRU EXISTING ACCESS  11/14/2021   IR URETERAL STENT PLACEMENT EXISTING ACCESS LEFT  11/12/2021   JOINT REPLACEMENT  2010   rt total knee   KNEE ARTHROSCOPY Right 09/2001   Rt knee   NEPHROLITHOTOMY Left 11/12/2021   Procedure: NEPHROLITHOTOMY PERCUTANEOUS;  Surgeon: Elisabeth Valli BIRCH, MD;  Location: [facility];  Service: Urology;  Laterality: Left;  3 HRS   OVARIAN CYST SURGERY Left    age 12 and appendectomy   RECTOCELE REPAIR  2009   REDUCTION MAMMAPLASTY Bilateral    THYROIDECTOMY N/A 06/17/2019   Procedure: TOTAL THYROIDECTOMY;  Surgeon: Eletha Boas, MD;  Location: [facility];  Service: General;  Laterality: N/A;   TOTAL KNEE ARTHROPLASTY Left 12/06/2018   Procedure: Left Knee Arthroplasty;  Surgeon: Liam Lerner, MD;  Location: [facility];  Service: Orthopedics;  Laterality: Left;   UPPER GI ENDOSCOPY     Social History:  reports that she has never smoked. She has never used smokeless tobacco. She reports that she does not drink alcohol and does not use drugs. Family History:  Family History  Problem Relation Age of Onset   Heart disease Mother    Heart attack Father 31   Heart disease Father    Heart attack Brother    Diabetes Brother    Heart disease Brother    Breast cancer Daughter 45     HOME MEDICATIONS: Allergies as of 01/27/2024   No Known Allergies      Medication List        Accurate as of January 27, 2024  6:56 AM. If you have any questions, ask your nurse or doctor.          acetaminophen  325 MG tablet Commonly known as: TYLENOL  Take 650 mg by mouth every 6 (six) hours as needed for moderate pain or headache.   amLODipine  5 MG tablet Commonly known as: NORVASC  TAKE ONE TABLET EVERY DAY   co-enzyme Q-10 30 MG capsule Take 30 mg by mouth 3 (three) times daily.   furosemide  40 MG tablet Commonly known as: LASIX  TAKE ONE TABLET DAILY   Garlic 10 MG Caps Take by mouth.    HYDROcodone -acetaminophen  5-325 MG tablet Commonly known as: NORCO/VICODIN Take 1 tablet by mouth every 4 (four) hours as needed.   levothyroxine  200 MCG tablet Commonly known as: SYNTHROID  Take 2 tablets (400 mcg total) by mouth daily before breakfast. What changed: additional instructions   promethazine  25 MG tablet Commonly known as: PHENERGAN  Take 1 tablet (25 mg total) by mouth every 8 (eight) hours as needed for nausea or vomiting.   Red Yeast Rice 55 MG Caps Take by mouth.          OBJECTIVE:   PHYSICAL EXAM: VS: There were no vitals taken for this visit.    EXAM: General:  Pt appears well and is in NAD  Neck: General: Supple without adenopathy. Thyroid : No nodules appreciated  Lungs: Clear with good BS bilat   Heart: Auscultation: RRR.  Mental Status: Judgment, insight: Intact Orientation: Oriented to time, place, and person Mood and affect: No depression, anxiety, or agitation     DATA REVIEWED:   Latest Reference Range & Units 07/28/23 08:32  TSH 0.40 - 4.50 mIU/L 2.73    Pathology report 06/17/2019 Procedure: Total thyroidectomy  Tumor Focality: Unifocal  Tumor Site: Left thyroid  lobe  Tumor Size: 6.0 cm  Histologic Type: Papillary thyroid  carcinoma, follicular variant  Margins: Uninvolved by tumor  Angioinvasion: Not identified  Lymphatic Invasion: Not identified  Extrathyroidal extension: Not identified  Regional Lymph Nodes: No lymph nodes submitted or found  Pathologic Stage Classification (pTNM, AJCC 8th Edition): pT3a, pNx  field    WBS 12/09/19  Focus of increased tracer uptake at LEFT thyroid  bed consistent with thyroid  remnant.   Additional less intense focus of uptake cranial to the more intense focus at the LEFT thyroid  bed, question additional thyroid  remnant versus regional metastatic lymph node.    Thyroid  bed ultrasound 02/05/2023    FINDINGS: Isthmus: Surgically absent. There is no residual nodular soft  tissue within the isthmic resection bed.   Right lobe: Surgically absent. There is no residual nodular soft tissue within the right lobectomy resection bed.   Left lobe: Surgically absent. There is no residual nodular soft tissue within the left lobectomy resection bed.   _________________________________________________________   No regional cervical lymphadenopathy.   IMPRESSION: Post total thyroidectomy without evidence of residual or locally recurrent disease.   ASSESSMENT / PLAN / RECOMMENDATIONS:   Papillary thyroid  carcinoma ( pT3a, pNx) , Stage II  - S/P RAI ablation 11/2019  -Patient with excellent biochemical and structural response to therapy - TSH goal for  is ~ 0.5-2.0  uIU/mL given advanced age and increased risk of cardiac arrhythmia with low TSH levels - Tg,  Tg Ab pending, historically has been low   2. Post-Operative Hypothyroidism:   - Pt is clinically euthyroid  -TSH is slightly above goal, but I would not change the dose at this time as her TFTs have been fluctuating and difficult to manage  Medications   Continue  levothyroxine  200 mcg ,2 tablets on Friday, Saturday, and Sunday and  1 tablet Monday through Thursday   F/u in 6 months   Signed electronically by: Stefano Redgie Butts, MD  [facility]  [facility] [address] [place], [place] [address] Phone: [phone] FAX: [phone]      CC: Perri Ronal PARAS, MD [address] [place] [address] Phone: [phone]  Fax: [phone]   Return to Endocrinology clinic as below: Future Appointments  Date Time Provider Department Center  01/27/2024  7:50 AM Hutson Luft, Donell Redgie, MD [id number] None  08/11/2024  9:00 AM [facility] [address]  08/15/2024 10:00 AM Baxley, Ronal PARAS, MD [facility] [address]

## 2024-01-29 ENCOUNTER — Encounter: Payer: Self-pay | Admitting: Internal Medicine

## 2024-01-29 ENCOUNTER — Ambulatory Visit: Payer: Self-pay

## 2024-01-29 ENCOUNTER — Ambulatory Visit (INDEPENDENT_AMBULATORY_CARE_PROVIDER_SITE_OTHER): Admitting: Internal Medicine

## 2024-01-29 VITALS — BP 130/80 | HR 95 | Temp 98.5°F | Ht 65.0 in | Wt 211.0 lb

## 2024-01-29 DIAGNOSIS — R059 Cough, unspecified: Secondary | ICD-10-CM

## 2024-01-29 DIAGNOSIS — I10 Essential (primary) hypertension: Secondary | ICD-10-CM

## 2024-01-29 DIAGNOSIS — E039 Hypothyroidism, unspecified: Secondary | ICD-10-CM

## 2024-01-29 DIAGNOSIS — J22 Unspecified acute lower respiratory infection: Secondary | ICD-10-CM

## 2024-01-29 DIAGNOSIS — R0981 Nasal congestion: Secondary | ICD-10-CM | POA: Diagnosis not present

## 2024-01-29 LAB — POC COVID19/FLU A&B COMBO
Covid Antigen, POC: NEGATIVE
Influenza A Antigen, POC: NEGATIVE
Influenza B Antigen, POC: NEGATIVE

## 2024-01-29 MED ORDER — FLUCONAZOLE 150 MG PO TABS: ORAL_TABLET | ORAL | 0 refills | Status: AC

## 2024-01-29 MED ORDER — AZITHROMYCIN 250 MG PO TABS: ORAL_TABLET | ORAL | 0 refills | Status: AC

## 2024-01-29 MED ORDER — BENZONATATE 100 MG PO CAPS: 100.0000 mg | ORAL_CAPSULE | Freq: Two times a day (BID) | ORAL | 0 refills | Status: AC | PRN

## 2024-01-29 NOTE — Telephone Encounter (Signed)
 FYI Only or Action Required?: Action required by provider: Requesting cough medicine.  Patient was last seen in primary care on 08/14/2023 by Perri Ronal PARAS, MD.  Called Nurse Triage reporting Cough.  Symptoms began several days ago.  Interventions attempted: OTC medications: Corcidin, cough drops.  Symptoms are: gradually improving.  Triage Disposition: Home Care  Patient/caregiver understands and will follow disposition?: Yes  Reason for Disposition  Cough with cold symptoms (e.g., runny nose, postnasal drip, throat clearing)  Answer Assessment - Initial Assessment Questions Patient had some cold symptoms that have cleared and now just has post nasal drip with a lingering cough. Tried OTC Corcidin and cough drops. Patient is requesting  cough medicine be sent in to [facility] pharmacy on file. Call back [phone]   1. ONSET: When did the cough begin?      Sunday   2. SEVERITY: How bad is the cough today?      Lingering from post nasal drip  3. SPUTUM: Describe the color of your sputum (e.g., none, dry cough; clear, white, yellow, green)     Light green  6. FEVER: Do you have a fever? If Yes, ask: What is your temperature, how was it measured, and when did it start?     Denies  10. OTHER SYMPTOMS: Do you have any other symptoms? (e.g., runny nose, wheezing, chest pain)       Started with a sore throat with some mild congestion  Protocols used: Cough - Acute Productive-A-AH  Message from Mia F sent at 01/29/2024  9:09 AM EDT  Reason for Triage: Pt says she had a cold that started on Sunday. The cold has went away but now she has drainage that is causing a lingering cough. Pt would like to get medication to help with this cough. Cough is not worsening but lingering .

## 2024-01-29 NOTE — Progress Notes (Signed)
 Patient Care Team: Perri Ronal PARAS, MD as PCP - General (Internal Medicine)  Visit Date: 01/29/24  Subjective:   Chief Complaint  Patient presents with   Cough    Patient ID:Cassidy  R [name],Female DOB:September 20, 1940,83 y.o. FMW:[id number]   83 y.o.Female presents today for acute sick visit with Cough. Patient has a past medical history of HTN, Impaired Glucose Tolerance, Hypothyroidism, Hx of PE, HLD. She says that Saturday night she experienced a sore throat, then shortly after developed a productive cough with expectoration of yellow/green phlegm. Denies fever or shaking chills. Possible exposures she notes: went to a wedding 2 weeks ago and has been having her kitchen remodeled.   Past Medical History:  Diagnosis Date   Aortic atherosclerosis 02/10/2022   Cancer (HCC)    right foot,top of arm, lympth nodes removed   Degenerative arthritis of left knee    Dependent edema    DVT (deep venous thrombosis) (HCC)    Fatty liver    Heart murmur    occ   High risk surgery, pre-operative cardiovascular examination 11/06/2021   History of kidney stones    HTN (hypertension), benign    Hyperlipidemia    Hypothyroidism    Lymphedema    bi lat leggs   Melanoma (HCC)    Migraine 08/27/2010   Osteopenia    PE (pulmonary embolism) 2010   bilateral hx post op knee replacement   PONV (postoperative nausea and vomiting)    Pure hypercholesterolemia 11/06/2021   Shortness of breath 11/06/2021   on exertion    No Known Allergies Immunization History  Administered Date(s) Administered   Influenza Split 02/10/2012   Influenza,inj,Quad PF,6+ Mos 03/29/2013, 02/17/2019   PFIZER Comirnaty(Gray Top)Covid-19 Tri-Sucrose Vaccine 07/01/2019, 07/25/2019   PNEUMOCOCCAL CONJUGATE-20 05/27/2022   Pneumococcal Conjugate-13 11/19/2008   Pneumococcal Polysaccharide-23 02/10/2018   Tdap 08/27/2010   Past Surgical History:  Procedure Laterality Date   ABDOMINAL HYSTERECTOMY     age 71    AMPUTATION  11/10/2011   Procedure: AMPUTATION RAY;  Surgeon: Dempsey PARAS Sensor, MD;  Location: [facility];  Service: Orthopedics;  Laterality: Left;  LEFT 2ND TOE TRANS MIDDLE PHALANX AMPUTATION    ANKLE FUSION  2005   lt-fusion   CHOLECYSTECTOMY  02/14/2002   COLONOSCOPY     CYSTOSCOPY WITH RETROGRADE PYELOGRAM, URETEROSCOPY AND STENT PLACEMENT Left 12/24/2021   Procedure: CYSTOSCOPY WITH RETROGRADE PYELOGRAM, URETEROSCOPY AND STENT REPLACEMENT;  Surgeon: Elisabeth Valli BIRCH, MD;  Location: [facility];  Service: Urology;  Laterality: Left;   GANGLION CYST EXCISION  2008   Rt wrist   HOLMIUM LASER APPLICATION Left 12/24/2021   Procedure: HOLMIUM LASER APPLICATION;  Surgeon: Elisabeth Valli BIRCH, MD;  Location: [facility];  Service: Urology;  Laterality: Left;   IR NEPHROSTOGRAM LEFT THRU EXISTING ACCESS  11/14/2021   IR URETERAL STENT PLACEMENT EXISTING ACCESS LEFT  11/12/2021   JOINT REPLACEMENT  2010   rt total knee   KNEE ARTHROSCOPY Right 09/2001   Rt knee   NEPHROLITHOTOMY Left 11/12/2021   Procedure: NEPHROLITHOTOMY PERCUTANEOUS;  Surgeon: Elisabeth Valli BIRCH, MD;  Location: [facility];  Service: Urology;  Laterality: Left;  3 HRS   OVARIAN CYST SURGERY Left    age 80 and appendectomy   RECTOCELE REPAIR  2009   REDUCTION MAMMAPLASTY Bilateral    THYROIDECTOMY N/A 06/17/2019   Procedure: TOTAL THYROIDECTOMY;  Surgeon: Eletha Boas, MD;  Location: [facility];  Service: General;  Laterality: N/A;   TOTAL KNEE ARTHROPLASTY Left 12/06/2018  Procedure: Left Knee Arthroplasty;  Surgeon: Liam Lerner, MD;  Location: [facility];  Service: Orthopedics;  Laterality: Left;   UPPER GI ENDOSCOPY      Family History  Problem Relation Age of Onset   Heart disease Mother    Heart attack Father 43   Heart disease Father    Heart attack Brother    Diabetes Brother    Heart disease Brother    Breast cancer Daughter 65   Social History   Social History Narrative   Divorced. Resides alone. Non-smoker, social  alcohol consumption. Has 2 adult children, a son and a daughter, and 3 grandchildren.    2025 - went to a wedding in 2024 and is going to another wedding in 01-18-2024      Fhx:    Father, deceased aged 70 of an Acute MI, w/ hx of Heart Disease   Mother, deceased aged 66 of Heart Disease   1 Brother, deceased aged 36 due to an airplane crash w/ hx of Diabetes and Heart Disease   2 Sisters in good health   3 Daughters -1 w/ hx of Breast Cancer onset aged 48   Review of Systems  Constitutional:  Negative for chills and fever.  HENT:  Positive for congestion and sore throat.   Respiratory:  Positive for cough and sputum production (yellow/green).      Objective:  Vitals: BP 130/80   Pulse 95   Temp 98.5 F (36.9 C)   Ht 5' 5 (1.651 m)   Wt 211 lb (95.7 kg)   SpO2 96%   BMI 35.11 kg/m   Physical Exam Vitals and nursing note reviewed.  Constitutional:      General: She is not in acute distress.    Appearance: Normal appearance. She is not ill-appearing or toxic-appearing.  HENT:     Head: Normocephalic and atraumatic.     Right Ear: Tympanic membrane, ear canal and external ear normal. Tympanic membrane is not erythematous.     Left Ear: Ear canal and external ear normal. Tympanic membrane is not erythematous.     Ears:     Comments: Left TM dull, not red     Mouth/Throat:     Mouth: Mucous membranes are moist.     Pharynx: Posterior oropharyngeal erythema (slight) present. No oropharyngeal exudate.  Pulmonary:     Effort: Pulmonary effort is normal.     Breath sounds: Normal breath sounds. No wheezing, rhonchi or rales.  Lymphadenopathy:     Cervical: No cervical adenopathy.  Skin:    General: Skin is warm and dry.  Neurological:     Mental Status: She is alert and oriented to person, place, and time. Mental status is at baseline.  Psychiatric:        Mood and Affect: Mood normal.        Behavior: Behavior normal.        Thought Content: Thought content normal.         Judgment: Judgment normal.    Results:  Studies Obtained And Personally Reviewed By Me: Labs:  CBC w/ Differential Lab Results  Component Value Date   WBC 5.3 06/01/2023   RBC 4.70 06/01/2023   HGB 13.9 06/01/2023   HCT 43.8 06/01/2023   PLT 207 06/01/2023   MCV 93.2 06/01/2023   MCH 29.6 06/01/2023   MCHC 31.7 (L) 06/01/2023   RDW 13.0 06/01/2023   MPV 11.0 06/01/2023   LYMPHSABS 2,040 05/26/2022   MONOABS 0.8 11/14/2021  BASOSABS 48 06/01/2023    Comprehensive Metabolic Panel Lab Results  Component Value Date   NA 144 06/01/2023   K 4.8 06/01/2023   CL 106 06/01/2023   CO2 29 06/01/2023   GLUCOSE 97 06/01/2023   BUN 15 06/01/2023   CREATININE 0.94 06/01/2023   CALCIUM  9.3 06/01/2023   PROT 7.0 06/01/2023   ALBUMIN 4.8 (H) 02/10/2022   AST 18 06/01/2023   ALT 17 06/01/2023   ALKPHOS 102 02/10/2022   BILITOT 0.6 06/01/2023   EGFR 61 06/01/2023   GFRNONAA 57 (L) 12/16/2021   Lipid Panel  Lab Results  Component Value Date   CHOL 196 06/01/2023   HDL 57 06/01/2023   LDLCALC 116 (H) 06/01/2023   TRIG 115 06/01/2023   A1c Lab Results  Component Value Date   HGBA1C 5.6 06/01/2023    TSH Lab Results  Component Value Date   TSH 2.73 07/28/2023   Results for orders placed or performed in visit on 01/29/24  POC Covid19/Flu A&B Antigen  Result Value Ref Range   Influenza A Antigen, POC Negative Negative   Influenza B Antigen, POC Negative Negative   Covid Antigen, POC Negative Negative   Assessment & Plan:   Orders Placed This Encounter  Procedures   POC Covid19/Flu A&B Antigen   Meds ordered this encounter  Medications   azithromycin (ZITHROMAX) 250 MG tablet    Sig: Take 2 tablets on day 1, then 1 tablet daily on days 2 through 5    Dispense:  6 tablet    Refill:  0   fluconazole (DIFLUCAN) 150 MG tablet    Sig: Take one tab by mouth for Candida(yeast) vaginitis if needed    Dispense:  1 tablet    Refill:  0   benzonatate (TESSALON) 100 MG  capsule    Sig: Take 1 capsule (100 mg total) by mouth 2 (two) times daily as needed for cough.    Dispense:  20 capsule    Refill:  0   Acute Lower respiratory infection   Symptoms began Saturday night with a sore throat that developed into a productive cough with expectoration of yellow/green phlegm. No fever or shaking chills. Possible exposures: a wedding 2 weeks ago and has been having her kitchen remodeled. In-office combined Flu & Covid-19 test was negative.   Sending in Azithromycin 250 mg - take 2 tablets on Day 1 and 1 tablet on Days 2-5, Tessalon Perles 100 mg for cough -  take 1 capsule (100 mg total) by mouth 3 (three) times daily as needed, and Diflucan 150 mg in case she develops Candida vaginitis. Contact us  within 5 days if not improved or sooner if symptoms worsen symptoms  despite treatment.   HTN stable on current regimen of amlodipine  and  furosemide    Post-surgical Hypothyroidism treated with thyroid  replacement therapy and stable    I,Emily Lagle,acting as a scribe for Ronal JINNY Hailstone, MD.,have documented all relevant documentation on the behalf of Ronal JINNY Hailstone, MD,as directed by  Ronal JINNY Hailstone, MD while in the presence of Ronal JINNY Hailstone, MD.  I, Ronal JINNY Hailstone, MD, have reviewed all documentation for this visit. The documentation on 01/29/2024 for the exam, diagnosis, procedures, and orders are all accurate and complete.

## 2024-02-08 DIAGNOSIS — H5203 Hypermetropia, bilateral: Secondary | ICD-10-CM | POA: Diagnosis not present

## 2024-02-08 DIAGNOSIS — H2511 Age-related nuclear cataract, right eye: Secondary | ICD-10-CM | POA: Diagnosis not present

## 2024-02-11 NOTE — Patient Instructions (Signed)
 You have been diagnosed with an acute lower respiratory infection.  Please take Zithromax Z-PAK 2 tablets day 1 followed by 1 tab days 2 through 5.  May take Tessalon Perles 100 mg up to 3 times daily as needed for cough.  You may also take Diflucan 150 mg tablet in case you develop Candida vaginitis while on antibiotics.  Rest and stay well-hydrated.  Contact us  in 5 days if symptoms worsen or

## 2024-04-18 ENCOUNTER — Ambulatory Visit: Admitting: Podiatry

## 2024-04-18 DIAGNOSIS — L84 Corns and callosities: Secondary | ICD-10-CM

## 2024-04-18 DIAGNOSIS — M79675 Pain in left toe(s): Secondary | ICD-10-CM

## 2024-04-18 DIAGNOSIS — M79674 Pain in right toe(s): Secondary | ICD-10-CM | POA: Diagnosis not present

## 2024-04-18 DIAGNOSIS — B351 Tinea unguium: Secondary | ICD-10-CM | POA: Diagnosis not present

## 2024-04-18 NOTE — Progress Notes (Signed)
 Patient ID: Glinda  R Poser, female   DOB: 1940-07-15, 83 y.o.   MRN: [id number] Chief Complaint  Patient presents with   rfc    Patient presents today for routine foot care no changes since LMV     Ms. Damron, 83 year old female presents the office today for concerns of thick painful toenails and calluses to both of her feet as well as calluses.  No other concerns today and no open lesions.   Objective: AAO 3, NAD DP/PT pulses are palpable, CRT less than 3 seconds  Chronic edema present bilaterally-unchanged  Protective sensation absent with Sallye Speed monofilament Thick hyperkeratotic tissue plantar aspect right hallux, left medial 1st metatarsal head. Nails are hypertrophic, dystrophic, brittle, discolored, elongated 9. No surrounding redness or drainage. Tenderness nails 1-5 bilaterally except for left second toe which has been amputated. No area pinpoint tenderness today. Compression wraps on the legs so the exam is limited to the feet.  No pain with calf compression, swelling, warmth, erythema.  Assessment: 83 year old female with pre-ulcerative calluses bilaterally, symptomatic onychomycosis  Plan: -Treatment options discussed including all alternatives, risks, and complications -Nail sharply debrided 9 without any complications or bleeding -Hyperkeratotic lesion sharply debrided  3 without complications. Monitor for any skin breakdown. Continue offloading. Moisturizer  -Continue compression, elevation. -Follow-up as scheduled or sooner if any problems arise. In the meantime, encouraged to call the office with any questions, concerns, change in symptoms.   Return in about 3 months (around 07/17/2024).  Donnice Fees, DPM

## 2024-05-12 ENCOUNTER — Other Ambulatory Visit: Payer: Self-pay | Admitting: Internal Medicine

## 2024-05-12 DIAGNOSIS — C73 Malignant neoplasm of thyroid gland: Secondary | ICD-10-CM

## 2024-07-18 ENCOUNTER — Ambulatory Visit: Admitting: Podiatry

## 2024-08-11 ENCOUNTER — Other Ambulatory Visit: Payer: Self-pay

## 2024-08-15 ENCOUNTER — Ambulatory Visit: Payer: Self-pay | Admitting: Internal Medicine
